# Patient Record
Sex: Male | Born: 1935
Health system: Southern US, Community
[De-identification: ages and names within clinical notes are randomized; demographics above are authoritative.]

## PROBLEM LIST (undated history)

## (undated) DIAGNOSIS — G459 Transient cerebral ischemic attack, unspecified: Secondary | ICD-10-CM

## (undated) DIAGNOSIS — C801 Malignant (primary) neoplasm, unspecified: Secondary | ICD-10-CM

## (undated) DIAGNOSIS — I10 Essential (primary) hypertension: Secondary | ICD-10-CM

## (undated) DIAGNOSIS — K921 Melena: Secondary | ICD-10-CM

## (undated) DIAGNOSIS — K2270 Barrett's esophagus without dysplasia: Secondary | ICD-10-CM

## (undated) DIAGNOSIS — N200 Calculus of kidney: Secondary | ICD-10-CM

## (undated) DIAGNOSIS — K219 Gastro-esophageal reflux disease without esophagitis: Secondary | ICD-10-CM

## (undated) DIAGNOSIS — E785 Hyperlipidemia, unspecified: Secondary | ICD-10-CM

## (undated) HISTORY — DX: Melena: K92.1

## (undated) HISTORY — DX: Gastro-esophageal reflux disease without esophagitis: K21.9

## (undated) HISTORY — DX: Barrett's esophagus without dysplasia: K22.70

## (undated) HISTORY — DX: Calculus of kidney: N20.0

## (undated) HISTORY — PX: PROSTATE BIOPSY: SHX241

## (undated) HISTORY — DX: Essential (primary) hypertension: I10

## (undated) HISTORY — PX: SHOULDER SURGERY: SHX246

## (undated) HISTORY — DX: Hyperlipidemia, unspecified: E78.5

## (undated) HISTORY — DX: Malignant (primary) neoplasm, unspecified: C80.1

## (undated) HISTORY — PX: EYE SURGERY: SHX253

## (undated) HISTORY — DX: Transient cerebral ischemic attack, unspecified: G45.9

---

## 2007-09-07 ENCOUNTER — Ambulatory Visit: Payer: Self-pay | Admitting: Gastroenterology

## 2007-11-03 ENCOUNTER — Ambulatory Visit: Payer: Self-pay | Admitting: Gastroenterology

## 2008-04-10 ENCOUNTER — Ambulatory Visit: Payer: Self-pay | Admitting: Gastroenterology

## 2009-09-16 ENCOUNTER — Ambulatory Visit: Payer: Self-pay | Admitting: Gastroenterology

## 2010-09-26 ENCOUNTER — Emergency Department (HOSPITAL_COMMUNITY)
Admission: EM | Admit: 2010-09-26 | Discharge: 2010-09-26 | Payer: Self-pay | Source: Home / Self Care | Admitting: Emergency Medicine

## 2010-12-15 LAB — POCT I-STAT, CHEM 8
BUN: 11 mg/dL (ref 6–23)
Creatinine, Ser: 1.6 mg/dL — ABNORMAL HIGH (ref 0.4–1.5)
Hemoglobin: 13.9 g/dL (ref 13.0–17.0)
Potassium: 3.4 mEq/L — ABNORMAL LOW (ref 3.5–5.1)
TCO2: 21 mmol/L (ref 0–100)

## 2011-09-24 ENCOUNTER — Encounter: Payer: Self-pay | Admitting: Family Medicine

## 2011-09-24 ENCOUNTER — Ambulatory Visit (INDEPENDENT_AMBULATORY_CARE_PROVIDER_SITE_OTHER): Payer: Medicare Other | Admitting: Family Medicine

## 2011-09-24 DIAGNOSIS — E785 Hyperlipidemia, unspecified: Secondary | ICD-10-CM

## 2011-09-24 DIAGNOSIS — K219 Gastro-esophageal reflux disease without esophagitis: Secondary | ICD-10-CM

## 2011-09-24 DIAGNOSIS — I10 Essential (primary) hypertension: Secondary | ICD-10-CM

## 2011-09-24 DIAGNOSIS — Z8619 Personal history of other infectious and parasitic diseases: Secondary | ICD-10-CM

## 2011-09-24 MED ORDER — LISINOPRIL-HYDROCHLOROTHIAZIDE 20-12.5 MG PO TABS: 0.5000 | ORAL_TABLET | Freq: Every day | ORAL | Status: DC

## 2011-09-24 MED ORDER — CHOLINE FENOFIBRATE 135 MG PO CPDR: 135.0000 mg | DELAYED_RELEASE_CAPSULE | Freq: Every day | ORAL | Status: DC

## 2011-09-24 MED ORDER — ESOMEPRAZOLE MAGNESIUM 40 MG PO CPDR: 40.0000 mg | DELAYED_RELEASE_CAPSULE | Freq: Every day | ORAL | Status: DC

## 2011-09-24 MED ORDER — METOPROLOL TARTRATE 25 MG PO TABS: 25.0000 mg | ORAL_TABLET | Freq: Two times a day (BID) | ORAL | Status: DC

## 2011-09-24 NOTE — Progress Notes (Signed)
New patient.   Hypertension:    Using medication without problems or lightheadedness: yes Chest pain with exertion:no Edema:no Short of breath: no  Elevated Cholesterol: Using medications without problems:yes Muscle aches: no Diet compliance:yes Exercise:yes  Uncertain varicella status.  We talked about this.  See notes on labs.    GERD.  Some heatburn, controlled with nexium.  S/p endoscopy.  He occ gets a gassy feeling, better after belching.    Meds, vitals, and allergies reviewed.   PMH and SH reviewed  ROS: See HPI.  Otherwise negative.    GEN: nad, alert and oriented HEENT: mucous membranes moist NECK: supple w/o LA CV: rrr. PULM: ctab, no inc wob ABD: soft, +bs EXT: no edema SKIN: no acute rash

## 2011-09-24 NOTE — Patient Instructions (Signed)
Take care and schedule a physical in summertime 2013.  Glad to see you.

## 2011-09-27 ENCOUNTER — Encounter: Payer: Self-pay | Admitting: Family Medicine

## 2011-09-27 DIAGNOSIS — Z8619 Personal history of other infectious and parasitic diseases: Secondary | ICD-10-CM | POA: Insufficient documentation

## 2011-09-27 DIAGNOSIS — I10 Essential (primary) hypertension: Secondary | ICD-10-CM | POA: Insufficient documentation

## 2011-09-27 DIAGNOSIS — E785 Hyperlipidemia, unspecified: Secondary | ICD-10-CM | POA: Insufficient documentation

## 2011-09-27 DIAGNOSIS — K219 Gastro-esophageal reflux disease without esophagitis: Secondary | ICD-10-CM | POA: Insufficient documentation

## 2011-09-27 NOTE — Assessment & Plan Note (Signed)
Controlled with benign exam, requesting records.

## 2011-09-27 NOTE — Assessment & Plan Note (Addendum)
Requesting records, no change in meds. >45 min spent with face to face with patient, >50% counseling and/or coordinating care

## 2011-09-27 NOTE — Assessment & Plan Note (Signed)
Requesting records, no change in meds.  D/w pt about diet and weight.

## 2011-10-12 ENCOUNTER — Encounter: Payer: Self-pay | Admitting: Family Medicine

## 2011-10-12 DIAGNOSIS — K2270 Barrett's esophagus without dysplasia: Secondary | ICD-10-CM | POA: Insufficient documentation

## 2011-10-12 DIAGNOSIS — N183 Chronic kidney disease, stage 3 (moderate): Secondary | ICD-10-CM

## 2011-12-10 ENCOUNTER — Ambulatory Visit (INDEPENDENT_AMBULATORY_CARE_PROVIDER_SITE_OTHER): Payer: Medicare Other | Admitting: Family Medicine

## 2011-12-10 ENCOUNTER — Encounter: Payer: Self-pay | Admitting: Family Medicine

## 2011-12-10 VITALS — BP 142/88 | HR 68 | Temp 97.8°F | Wt 184.0 lb

## 2011-12-10 DIAGNOSIS — R05 Cough: Secondary | ICD-10-CM

## 2011-12-10 MED ORDER — HYDROCODONE-HOMATROPINE 5-1.5 MG/5ML PO SYRP: 5.0000 mL | ORAL_SOLUTION | Freq: Three times a day (TID) | ORAL | Status: AC | PRN

## 2011-12-10 MED ORDER — AZITHROMYCIN 250 MG PO TABS: ORAL_TABLET | ORAL | Status: AC

## 2011-12-10 NOTE — Patient Instructions (Signed)
Start the antibiotics today and use the cough medicine as need.  It can make you drowsy.  Drink plenty of fluids, take tylenol as needed, and gargle with warm salt water for your throat.  This should gradually improve.  Take care.  Let us know if you have other concerns.

## 2011-12-10 NOTE — Progress Notes (Signed)
duration of symptoms: 10 days Rhinorrhea: yes with nocturnal post nasal gtt Congestion: no ear pain:no sore throat: yes Cough:yes with sputum, discolored Myalgias: yes other concerns: no fevers but did have some chills Appetite decreased.  No vomiting.    ROS: See HPI.  Otherwise negative.    Meds, vitals, and allergies reviewed.   GEN: nad, alert and oriented HEENT: mucous membranes moist, TM w/o erythema, nasal epithelium injected, OP with cobblestoning NECK: supple w/o LA CV: rrr. PULM: ctab except for coarse BS in LLL, no inc wob ABD: soft, +bs EXT: no edema

## 2011-12-11 DIAGNOSIS — R05 Cough: Secondary | ICD-10-CM | POA: Insufficient documentation

## 2011-12-11 DIAGNOSIS — R059 Cough, unspecified: Secondary | ICD-10-CM | POA: Insufficient documentation

## 2011-12-11 NOTE — Assessment & Plan Note (Signed)
Given the duration and the coarse bs, w/o start azithro and then f/u prn.  Nontoxic.  Okay for outpatient/ fu. D/w pt.

## 2012-03-08 ENCOUNTER — Other Ambulatory Visit (INDEPENDENT_AMBULATORY_CARE_PROVIDER_SITE_OTHER): Payer: Medicare Other

## 2012-03-08 DIAGNOSIS — I10 Essential (primary) hypertension: Secondary | ICD-10-CM | POA: Diagnosis not present

## 2012-03-08 DIAGNOSIS — Z8619 Personal history of other infectious and parasitic diseases: Secondary | ICD-10-CM

## 2012-03-08 LAB — LIPID PANEL
Cholesterol: 191 mg/dL (ref 0–200)
HDL: 49.6 mg/dL (ref >39.00)
Total CHOL/HDL Ratio: 4
Triglycerides: 155 mg/dL — ABNORMAL HIGH (ref 0.0–149.0)

## 2012-03-08 LAB — COMPREHENSIVE METABOLIC PANEL
AST: 26 U/L (ref 0–37)
Albumin: 3.8 g/dL (ref 3.5–5.2)
Alkaline Phosphatase: 58 U/L (ref 39–117)
BUN: 19 mg/dL (ref 6–23)
CO2: 29 mEq/L (ref 19–32)
Chloride: 110 mEq/L (ref 96–112)
Creatinine, Ser: 1.8 mg/dL — ABNORMAL HIGH (ref 0.4–1.5)
Glucose, Bld: 99 mg/dL (ref 70–99)

## 2012-03-09 LAB — VARICELLA ZOSTER ANTIBODY, IGG: Varicella IgG: 4.12 {ISR} — ABNORMAL HIGH

## 2012-03-15 ENCOUNTER — Ambulatory Visit (INDEPENDENT_AMBULATORY_CARE_PROVIDER_SITE_OTHER): Payer: Medicare Other | Admitting: Family Medicine

## 2012-03-15 ENCOUNTER — Encounter: Payer: Self-pay | Admitting: Family Medicine

## 2012-03-15 VITALS — BP 142/84 | HR 43 | Temp 98.4°F | Ht 66.5 in | Wt 185.0 lb

## 2012-03-15 DIAGNOSIS — M720 Palmar fascial fibromatosis [Dupuytren]: Secondary | ICD-10-CM | POA: Diagnosis not present

## 2012-03-15 DIAGNOSIS — Z23 Encounter for immunization: Secondary | ICD-10-CM

## 2012-03-15 DIAGNOSIS — N189 Chronic kidney disease, unspecified: Secondary | ICD-10-CM

## 2012-03-15 DIAGNOSIS — I10 Essential (primary) hypertension: Secondary | ICD-10-CM | POA: Diagnosis not present

## 2012-03-15 DIAGNOSIS — E785 Hyperlipidemia, unspecified: Secondary | ICD-10-CM

## 2012-03-15 DIAGNOSIS — K2270 Barrett's esophagus without dysplasia: Secondary | ICD-10-CM

## 2012-03-15 DIAGNOSIS — Z0000 Encounter for general adult medical examination without abnormal findings: Secondary | ICD-10-CM | POA: Diagnosis not present

## 2012-03-15 MED ORDER — TETANUS-DIPHTHERIA TOXOIDS TD 2-2 LF/0.5ML IM SUSP: 0.5000 mL | Freq: Once | INTRAMUSCULAR | Status: DC

## 2012-03-15 MED ORDER — SILDENAFIL CITRATE 50 MG PO TABS: 25.0000 mg | ORAL_TABLET | Freq: Every day | ORAL | Status: DC | PRN

## 2012-03-15 NOTE — Progress Notes (Signed)
I have personally reviewed the Medicare Annual Wellness questionnaire and have noted 1. The patient's medical and social history 2. Their use of alcohol, tobacco or illicit drugs 3. Their current medications and supplements 4. The patient's functional ability including ADL's, fall risks, home safety risks and hearing or visual             impairment. 5. Diet and physical activities 6. Evidence for depression or mood disorders  The patients weight, height, BMI have been recorded in the chart and visual acuity is per eye clinic.  I have made referrals, counseling and provided education to the patient based review of the above and I have provided the pt with a written personalized care plan for preventive services.  See plan and scanned forms.  Routine anticipatory guidance given to patient.  See health maintenance. He has a living will.   Td 2013 PNA done after age 73 per patient report Shingles shot encouraged Flu shot done 2012 PSA d/w pt.  He'll consider checking in future.    Hand pain.  His fingers are tight and he'll get locking at mult PIP joints.  He'll have to manually extend the fingers.  No recent trauma.  Gradually getting worse.   ED.  Asking for viagra.  Hasn't used prev.  No CP.    Elevated Cholesterol: Using medications without problems:yes Muscle aches: no Diet compliance: 'not very good', discussed.   Exercise:yes  Hypertension:    Using medication without problems or lightheadedness: yes Chest pain with exertion:no Edema:no Short of breath:no  CKD.  Baseline Cr 1.5-1.8.  Stable based on current labs.  Not on nsaids.  Labs d/w pt.   PMH and SH reviewed  Meds, vitals, and allergies reviewed.   ROS: See HPI.  Otherwise negative.    GEN: nad, alert and oriented HEENT: mucous membranes moist NECK: supple w/o LA CV: rrr. PULM: ctab, no inc wob ABD: soft, +bs EXT: no edema SKIN: no acute rash DRE deferred.  Flexor contractions noted with degenerative  changes at mult PIPs on the hands.

## 2012-03-15 NOTE — Assessment & Plan Note (Signed)
Continue ACE and avoid nsaids.  Wouldn't not inc meds at this point.  Will follow.

## 2012-03-15 NOTE — Assessment & Plan Note (Signed)
At baseline, would recheck in 6 months.  D/w pt.  If worsening, would refer to renal .

## 2012-03-15 NOTE — Patient Instructions (Addendum)
Check with your insurance to see if they will cover the shingles shot. I would get a flu shot each fall.   Get me a copy of your last upper endoscopy and colonoscopy so I can review them.  Think about checking a PSA.  It may not be useful to do at this point, since you have had negative biopsies previously.   Come back for a repeat blood draw in 6 month and then come see me for a visit after that.  See Rosaria Ferries about your referral before you leave today.

## 2012-03-15 NOTE — Assessment & Plan Note (Signed)
Continue PPI.  Tolerated well.

## 2012-03-15 NOTE — Assessment & Plan Note (Signed)
See plan and scanned forms. Routine anticipatory guidance given to patient. See health maintenance.  He has a living will.  Td 2013  PNA done after age 77 per patient report  Shingles shot encouraged  Flu shot done 2012  PSA d/w pt. He'll consider checking in future.

## 2012-03-15 NOTE — Assessment & Plan Note (Signed)
Acceptable and statin intolerant per patient.

## 2012-03-18 DIAGNOSIS — M19049 Primary osteoarthritis, unspecified hand: Secondary | ICD-10-CM | POA: Diagnosis not present

## 2012-03-18 DIAGNOSIS — M6530 Trigger finger, unspecified finger: Secondary | ICD-10-CM | POA: Diagnosis not present

## 2012-03-22 ENCOUNTER — Encounter: Payer: Self-pay | Admitting: Family Medicine

## 2012-03-22 DIAGNOSIS — Z789 Other specified health status: Secondary | ICD-10-CM | POA: Insufficient documentation

## 2012-04-01 DIAGNOSIS — M6530 Trigger finger, unspecified finger: Secondary | ICD-10-CM | POA: Diagnosis not present

## 2012-04-06 ENCOUNTER — Telehealth: Payer: Self-pay

## 2012-04-06 NOTE — Telephone Encounter (Signed)
Pt needs shingles rx sent to [place] will ck with pharmacy later in week.

## 2012-04-07 MED ORDER — ZOSTER VACCINE LIVE 19400 UNT/0.65ML ~~LOC~~ SOLR: 0.6500 mL | Freq: Once | SUBCUTANEOUS | Status: AC

## 2012-04-07 NOTE — Telephone Encounter (Signed)
Sent!

## 2012-04-14 DIAGNOSIS — M6530 Trigger finger, unspecified finger: Secondary | ICD-10-CM | POA: Diagnosis not present

## 2012-04-20 DIAGNOSIS — H2510 Age-related nuclear cataract, unspecified eye: Secondary | ICD-10-CM | POA: Diagnosis not present

## 2012-05-16 DIAGNOSIS — M19049 Primary osteoarthritis, unspecified hand: Secondary | ICD-10-CM | POA: Diagnosis not present

## 2012-05-16 DIAGNOSIS — M6530 Trigger finger, unspecified finger: Secondary | ICD-10-CM | POA: Diagnosis not present

## 2012-06-16 DIAGNOSIS — M6530 Trigger finger, unspecified finger: Secondary | ICD-10-CM | POA: Diagnosis not present

## 2012-06-21 ENCOUNTER — Encounter: Payer: Self-pay | Admitting: Family Medicine

## 2012-06-21 DIAGNOSIS — M6530 Trigger finger, unspecified finger: Secondary | ICD-10-CM | POA: Insufficient documentation

## 2012-08-08 DIAGNOSIS — M6530 Trigger finger, unspecified finger: Secondary | ICD-10-CM | POA: Diagnosis not present

## 2012-08-29 ENCOUNTER — Telehealth: Payer: Self-pay | Admitting: *Deleted

## 2012-08-29 MED ORDER — FENOFIBRATE 145 MG PO TABS: 145.0000 mg | ORAL_TABLET | Freq: Every day | ORAL | Status: DC

## 2012-08-29 NOTE — Telephone Encounter (Signed)
Form filled out, change to fenofibrate.  Med list updated.

## 2012-08-29 NOTE — Telephone Encounter (Signed)
Received form from pharmacy stating that Trilipix will no longer be covered on patient's drug list effective 10/05/12. Alternative drugs listed on form. Form is in your in box.

## 2012-08-29 NOTE — Telephone Encounter (Signed)
Form faxed

## 2012-08-30 DIAGNOSIS — H2510 Age-related nuclear cataract, unspecified eye: Secondary | ICD-10-CM | POA: Diagnosis not present

## 2012-09-19 DIAGNOSIS — M6530 Trigger finger, unspecified finger: Secondary | ICD-10-CM | POA: Diagnosis not present

## 2012-09-19 DIAGNOSIS — M19049 Primary osteoarthritis, unspecified hand: Secondary | ICD-10-CM | POA: Diagnosis not present

## 2012-09-20 ENCOUNTER — Ambulatory Visit: Payer: Self-pay | Admitting: Ophthalmology

## 2012-09-20 DIAGNOSIS — H2510 Age-related nuclear cataract, unspecified eye: Secondary | ICD-10-CM | POA: Diagnosis not present

## 2012-09-20 DIAGNOSIS — Z01812 Encounter for preprocedural laboratory examination: Secondary | ICD-10-CM | POA: Diagnosis not present

## 2012-09-20 DIAGNOSIS — I10 Essential (primary) hypertension: Secondary | ICD-10-CM | POA: Diagnosis not present

## 2012-09-20 DIAGNOSIS — Z01810 Encounter for preprocedural cardiovascular examination: Secondary | ICD-10-CM | POA: Diagnosis not present

## 2012-09-20 LAB — POTASSIUM: Potassium: 4.6 mmol/L (ref 3.5–5.1)

## 2012-09-22 ENCOUNTER — Telehealth: Payer: Self-pay

## 2012-09-22 NOTE — Telephone Encounter (Signed)
Pt said fenofibrate cost $300 and that is too expensive. Spoke with Legrand Como at [facility] [employer]; he said pt would need to contact insurance co for alternative substitutes. Pt will call ins. Co and call back with more affordable substitute for fenofibrate.

## 2012-10-10 ENCOUNTER — Ambulatory Visit: Payer: Self-pay | Admitting: Ophthalmology

## 2012-10-10 DIAGNOSIS — H269 Unspecified cataract: Secondary | ICD-10-CM | POA: Diagnosis not present

## 2012-10-10 DIAGNOSIS — H2510 Age-related nuclear cataract, unspecified eye: Secondary | ICD-10-CM | POA: Diagnosis not present

## 2012-10-10 DIAGNOSIS — Z79899 Other long term (current) drug therapy: Secondary | ICD-10-CM | POA: Diagnosis not present

## 2012-10-10 DIAGNOSIS — Z85828 Personal history of other malignant neoplasm of skin: Secondary | ICD-10-CM | POA: Diagnosis not present

## 2012-10-10 DIAGNOSIS — Z87891 Personal history of nicotine dependence: Secondary | ICD-10-CM | POA: Diagnosis not present

## 2012-10-10 DIAGNOSIS — E7800 Pure hypercholesterolemia, unspecified: Secondary | ICD-10-CM | POA: Diagnosis not present

## 2012-10-10 DIAGNOSIS — I10 Essential (primary) hypertension: Secondary | ICD-10-CM | POA: Diagnosis not present

## 2012-10-10 DIAGNOSIS — Z7982 Long term (current) use of aspirin: Secondary | ICD-10-CM | POA: Diagnosis not present

## 2012-10-10 DIAGNOSIS — K219 Gastro-esophageal reflux disease without esophagitis: Secondary | ICD-10-CM | POA: Diagnosis not present

## 2012-10-10 DIAGNOSIS — K2270 Barrett's esophagus without dysplasia: Secondary | ICD-10-CM | POA: Diagnosis not present

## 2012-12-22 DIAGNOSIS — D485 Neoplasm of uncertain behavior of skin: Secondary | ICD-10-CM | POA: Diagnosis not present

## 2012-12-22 DIAGNOSIS — L821 Other seborrheic keratosis: Secondary | ICD-10-CM | POA: Diagnosis not present

## 2013-01-09 DIAGNOSIS — D126 Benign neoplasm of colon, unspecified: Secondary | ICD-10-CM | POA: Diagnosis not present

## 2013-01-09 DIAGNOSIS — K2270 Barrett's esophagus without dysplasia: Secondary | ICD-10-CM | POA: Diagnosis not present

## 2013-01-11 DIAGNOSIS — K2270 Barrett's esophagus without dysplasia: Secondary | ICD-10-CM | POA: Diagnosis not present

## 2013-02-04 ENCOUNTER — Other Ambulatory Visit: Payer: Self-pay | Admitting: Family Medicine

## 2013-03-20 ENCOUNTER — Other Ambulatory Visit: Payer: Self-pay | Admitting: Family Medicine

## 2013-05-30 ENCOUNTER — Other Ambulatory Visit: Payer: Self-pay | Admitting: Family Medicine

## 2013-05-30 NOTE — Telephone Encounter (Signed)
Electronic refill request. Pt hasn't been seen in > 1 year, no upcoming appts scheduled.

## 2013-05-31 NOTE — Telephone Encounter (Signed)
Sent, schedule [facility] visit.  Thanks.

## 2013-06-01 NOTE — Telephone Encounter (Signed)
Patient advised.   Will call for CPE appt.

## 2013-06-13 DIAGNOSIS — H2510 Age-related nuclear cataract, unspecified eye: Secondary | ICD-10-CM | POA: Diagnosis not present

## 2013-06-13 DIAGNOSIS — Z961 Presence of intraocular lens: Secondary | ICD-10-CM | POA: Diagnosis not present

## 2013-07-03 ENCOUNTER — Other Ambulatory Visit: Payer: Self-pay | Admitting: Family Medicine

## 2013-07-03 NOTE — Telephone Encounter (Signed)
Electronic refill request. Patient has not been seen in > 1 year.  Please advise.

## 2013-07-03 NOTE — Telephone Encounter (Signed)
Sent, scheduled for later in 2014.

## 2013-07-27 ENCOUNTER — Other Ambulatory Visit: Payer: Self-pay | Admitting: Family Medicine

## 2013-07-27 DIAGNOSIS — I10 Essential (primary) hypertension: Secondary | ICD-10-CM

## 2013-08-01 ENCOUNTER — Other Ambulatory Visit (INDEPENDENT_AMBULATORY_CARE_PROVIDER_SITE_OTHER): Payer: Medicare Other

## 2013-08-01 DIAGNOSIS — I10 Essential (primary) hypertension: Secondary | ICD-10-CM

## 2013-08-01 LAB — LIPID PANEL
LDL Cholesterol: 113 mg/dL — ABNORMAL HIGH (ref 0–99)
Total CHOL/HDL Ratio: 4
VLDL: 23 mg/dL (ref 0.0–40.0)

## 2013-08-01 LAB — COMPREHENSIVE METABOLIC PANEL
ALT: 16 U/L (ref 0–53)
AST: 21 U/L (ref 0–37)
Albumin: 3.9 g/dL (ref 3.5–5.2)
Alkaline Phosphatase: 54 U/L (ref 39–117)
CO2: 29 mEq/L (ref 19–32)
Potassium: 4.4 mEq/L (ref 3.5–5.1)
Sodium: 140 mEq/L (ref 135–145)
Total Bilirubin: 0.9 mg/dL (ref 0.3–1.2)
Total Protein: 7.8 g/dL (ref 6.0–8.3)

## 2013-08-08 ENCOUNTER — Encounter: Payer: Self-pay | Admitting: Family Medicine

## 2013-08-08 ENCOUNTER — Ambulatory Visit (INDEPENDENT_AMBULATORY_CARE_PROVIDER_SITE_OTHER): Payer: Medicare Other | Admitting: Family Medicine

## 2013-08-08 VITALS — BP 124/66 | HR 60 | Temp 98.0°F | Ht 66.5 in | Wt 186.5 lb

## 2013-08-08 DIAGNOSIS — R7309 Other abnormal glucose: Secondary | ICD-10-CM

## 2013-08-08 DIAGNOSIS — Z23 Encounter for immunization: Secondary | ICD-10-CM

## 2013-08-08 DIAGNOSIS — I10 Essential (primary) hypertension: Secondary | ICD-10-CM

## 2013-08-08 DIAGNOSIS — K2270 Barrett's esophagus without dysplasia: Secondary | ICD-10-CM

## 2013-08-08 DIAGNOSIS — E785 Hyperlipidemia, unspecified: Secondary | ICD-10-CM

## 2013-08-08 DIAGNOSIS — H9190 Unspecified hearing loss, unspecified ear: Secondary | ICD-10-CM | POA: Diagnosis not present

## 2013-08-08 DIAGNOSIS — Z0000 Encounter for general adult medical examination without abnormal findings: Secondary | ICD-10-CM

## 2013-08-08 DIAGNOSIS — M76899 Other specified enthesopathies of unspecified lower limb, excluding foot: Secondary | ICD-10-CM

## 2013-08-08 DIAGNOSIS — M7062 Trochanteric bursitis, left hip: Secondary | ICD-10-CM

## 2013-08-08 DIAGNOSIS — R739 Hyperglycemia, unspecified: Secondary | ICD-10-CM

## 2013-08-08 LAB — HEMOGLOBIN A1C: Hgb A1c MFr Bld: 5.9 % (ref 4.6–6.5)

## 2013-08-08 MED ORDER — ESOMEPRAZOLE MAGNESIUM 40 MG PO CPDR: DELAYED_RELEASE_CAPSULE | ORAL | Status: DC

## 2013-08-08 MED ORDER — METOPROLOL TARTRATE 25 MG PO TABS: 25.0000 mg | ORAL_TABLET | Freq: Every day | ORAL | Status: DC

## 2013-08-08 MED ORDER — LISINOPRIL-HYDROCHLOROTHIAZIDE 20-12.5 MG PO TABS: ORAL_TABLET | ORAL | Status: DC

## 2013-08-08 MED ORDER — FENOFIBRATE 145 MG PO TABS: 145.0000 mg | ORAL_TABLET | Freq: Every day | ORAL | Status: DC

## 2013-08-08 NOTE — Patient Instructions (Signed)
Try taking 2 extra strength tylenol (500mg /tab) at night and use an ice bag on the painful area.  That should help.  Avoid ibuprofen/aleve/advil/Goody's/BCs.   Go to the lab on the way out.  We'll contact you with your lab report.  Rosaria Ferries will call about your referral. Avoid sugary foods as we discussed.   Take care.  Don't change your meds for now.

## 2013-08-09 ENCOUNTER — Encounter: Payer: Self-pay | Admitting: Family Medicine

## 2013-08-09 DIAGNOSIS — R739 Hyperglycemia, unspecified: Secondary | ICD-10-CM | POA: Insufficient documentation

## 2013-08-09 DIAGNOSIS — M7060 Trochanteric bursitis, unspecified hip: Secondary | ICD-10-CM | POA: Insufficient documentation

## 2013-08-09 NOTE — Assessment & Plan Note (Signed)
Stable d/w pt.  Will follow, continue ACE.  Avoid nsaids.

## 2013-08-09 NOTE — Assessment & Plan Note (Signed)
Controlled, continue as is.  Labs d/w pt.  

## 2013-08-09 NOTE — Assessment & Plan Note (Signed)
See scanned forms.  Routine anticipatory guidance given to patient.  See health maintenance. Flu 2014 Shingles 2013 PNA prev done Tetanus 2013 Colonoscopy not indicated given age.  Pt agrees.  Prostate cancer screening not indicated given age, prev neg biopsy x2.  Pt agrees.  Prostate cancer screening and PSA options (with potential risks and benefits of testing vs not testing) were discussed along with recent recs/guidelines.  He declined testing PSA at this point. Advance directive d/w pt, on file.  Cognitive function addressed- see scanned forms- and if abnormal then additional documentation follows.

## 2013-08-09 NOTE — Assessment & Plan Note (Signed)
Likely, d/w pt about anatomy, avoiding nsaids, using tylenol and ice and fu prn.  He agrees.

## 2013-08-09 NOTE — Assessment & Plan Note (Signed)
Hyperglycemia, noted on labs.  D/w pt re: low carb diet.  See f/u A1c.

## 2013-08-09 NOTE — Progress Notes (Signed)
I have personally reviewed the Medicare Annual Wellness questionnaire and have noted 1. The patient's medical and social history 2. Their use of alcohol, tobacco or illicit drugs 3. Their current medications and supplements 4. The patient's functional ability including ADL's, fall risks, home safety risks and hearing or visual             impairment. 5. Diet and physical activities 6. Evidence for depression or mood disorders  The patients weight, height, BMI have been recorded in the chart and visual acuity is per eye clinic.  I have made referrals, counseling and provided education to the patient based review of the above and I have provided the pt with a written personalized care plan for preventive services.  See scanned forms.  Routine anticipatory guidance given to patient.  See health maintenance. Flu 2014 Shingles 2013 PNA prev done Tetanus 2013 Colonoscopy not indicated given age.  Pt agrees.  Prostate cancer screening not indicated given age, prev neg biopsy x2.  Pt agrees.  Prostate cancer screening and PSA options (with potential risks and benefits of testing vs not testing) were discussed along with recent recs/guidelines.  He declined testing PSA at this point. Advance directive d/w pt, on file.  Cognitive function addressed- see scanned forms- and if abnormal then additional documentation follows.    Hypertension:    Using medication without problems or lightheadedness: y Chest pain with exertion:n Edema:n Short of breath:n  Elevated Cholesterol: Using medications without problems:y Muscle aches: n Diet compliance:y Exercise:y  H/o Barrett's on PPI and doing well w/o GERD sx.  No obstructive sx.  He declined further EGD given his age and lack of sx.    L lateral hip pain, near the greater troch, only pain at night when laying on that side for prolonged period.  Ibuprofen helps, discussed given his CKD.   Hyperglycemia, noted on labs.  D/w pt re: low carb diet.   See f/u A1c.  PMH and SH reviewed  Meds, vitals, and allergies reviewed.   ROS: See HPI.  Otherwise negative.    GEN: nad, alert and oriented HEENT: mucous membranes moist NECK: supple w/o LA CV: rrr. PULM: ctab, no inc wob ABD: soft, +bs EXT: no edema SKIN: no acute rash L hip with normal ROM and not ttp- the area that is sore at night is at the greater troch

## 2013-08-09 NOTE — Assessment & Plan Note (Signed)
Sx controlled with PPI, continue as is, he declined further EGD.

## 2013-08-10 ENCOUNTER — Encounter: Payer: Self-pay | Admitting: Family Medicine

## 2013-09-18 ENCOUNTER — Telehealth: Payer: Self-pay

## 2013-09-18 NOTE — Telephone Encounter (Signed)
Pt does not understand why he is getting denials from ins for 08/08/13 wellness exam, cost $559.00. [name] said medicare pd except for $20.66. Pt was relieved and appreciative.

## 2013-10-04 ENCOUNTER — Encounter: Payer: Self-pay | Admitting: Family Medicine

## 2013-10-16 ENCOUNTER — Ambulatory Visit: Payer: Self-pay | Admitting: Podiatry

## 2013-10-23 ENCOUNTER — Ambulatory Visit (INDEPENDENT_AMBULATORY_CARE_PROVIDER_SITE_OTHER): Payer: Medicare Other | Admitting: Podiatry

## 2013-10-23 ENCOUNTER — Encounter: Payer: Self-pay | Admitting: Podiatry

## 2013-10-23 VITALS — BP 131/76 | HR 57 | Resp 16 | Ht 67.0 in | Wt 182.0 lb

## 2013-10-23 DIAGNOSIS — L600 Ingrowing nail: Secondary | ICD-10-CM

## 2013-10-23 MED ORDER — NEOMYCIN-POLYMYXIN-HC 3.5-10000-1 OT SOLN: OTIC | Status: DC

## 2013-10-23 NOTE — Progress Notes (Signed)
   Subjective:    Patient ID: Justin Ford, male    DOB: 12/23/1929, 80 y.o.   MRN: [id number]  HPI Comments: i have an ingrown on my left big toe. Ive had it for 6 - 8 months and i have been trying to work with it with total failure. It does get better when i go into it and when it grows out it starts hurting again.      Review of Systems  All other systems reviewed and are negative.       Objective:   Physical Exam: I have reviewed his past medical history medications allergies surgeries social history and review of systems. Pulses are strongly palpable bilateral foot. Neurologic sensorium is intact per since once the monofilament. Deep tendon reflexes are intact bilateral muscle strength is 5 over 5 dorsiflexors plantar flexors inverters everters all intrinsic musculature must be intact. Orthopedic evaluation Mr. is all joints distal to the ankle a full range of motion without crepitus. Mild flexible digital deformities are noted bilateral. Sharp incurvated nail margin along the tibial border of the hallux left. This clinical signs of infection at this point time to        Assessment & Plan:  Assessment: Ingrown toenail paronychia hallux right.  Plan: Discussed etiology pathology conservative versus surgical therapies. At this point after local anesthetic a chemical matrixectomy was performed to the tibial border of the hallux left. He was given both oral and written home-going instructions for the care of this as well as soaking therapy. I will wrote her prescription for Cortisporin Otic which upon a twice a day basis after soaking. I will followup with him in one week.

## 2013-10-23 NOTE — Patient Instructions (Signed)

## 2013-10-30 ENCOUNTER — Ambulatory Visit (INDEPENDENT_AMBULATORY_CARE_PROVIDER_SITE_OTHER): Payer: Medicare Other | Admitting: Podiatry

## 2013-10-30 ENCOUNTER — Encounter: Payer: Self-pay | Admitting: Podiatry

## 2013-10-30 VITALS — BP 149/92 | HR 56 | Resp 16

## 2013-10-30 DIAGNOSIS — Z9889 Other specified postprocedural states: Secondary | ICD-10-CM

## 2013-10-30 NOTE — Progress Notes (Signed)
Nail check left great , little red I had been soaking in Epsom salts and water better recently started in Betadine. He denies fever chills nausea vomiting muscle aches or pains. States that the toe has a little bit tender.  Objective: Vital signs are stable he is alert and oriented x3. There is no erythema cellulitis drainage or odor the surgical nail site left. Some mild ecchymosis is noted.  Assessment: Well-healing surgical toe matrixectomy tibial border hallux left.  Plan: Discontinue Betadine start with Epsom salts warm water soaks continue to soak daily to completely heal. Cover during the day and leave open at night continued Cortisporin Otic as directed. Remembered it thank him for the articles  [facility].

## 2013-12-19 DIAGNOSIS — D235 Other benign neoplasm of skin of trunk: Secondary | ICD-10-CM | POA: Diagnosis not present

## 2013-12-19 DIAGNOSIS — Z85828 Personal history of other malignant neoplasm of skin: Secondary | ICD-10-CM | POA: Diagnosis not present

## 2014-04-15 ENCOUNTER — Other Ambulatory Visit: Payer: Self-pay | Admitting: Family Medicine

## 2014-06-08 DIAGNOSIS — H2510 Age-related nuclear cataract, unspecified eye: Secondary | ICD-10-CM | POA: Diagnosis not present

## 2014-06-14 DIAGNOSIS — Z23 Encounter for immunization: Secondary | ICD-10-CM | POA: Diagnosis not present

## 2014-06-29 DIAGNOSIS — L723 Sebaceous cyst: Secondary | ICD-10-CM | POA: Diagnosis not present

## 2014-07-25 DIAGNOSIS — H2512 Age-related nuclear cataract, left eye: Secondary | ICD-10-CM | POA: Diagnosis not present

## 2014-07-31 ENCOUNTER — Telehealth: Payer: Self-pay

## 2014-07-31 NOTE — Telephone Encounter (Signed)
Pt left v/m; pt received notice from [facility] that Nexium will no longer be covered and esomeprazole magnesium ( ? generic Nexium) is covered. Pt just got 90 day refill of Nexium. Pt wanted Dr Damita Dunnings to be aware in 90 days he will need esomeprazole instead of name brand Nexium. Pt last seen 08/08/2013 and no future appt scheduled.

## 2014-08-01 NOTE — Telephone Encounter (Signed)
Esomeprazole is generic nexium.  That is what we have listed on the EMR now, so that should be okay for the refills later one.  Please schedule a CPE when possible.  Thanks.

## 2014-08-01 NOTE — Telephone Encounter (Signed)
Patient notified as instructed by telephone. Patient transferred to scheduler to get a physical scheduled.

## 2014-08-02 ENCOUNTER — Ambulatory Visit: Payer: Self-pay | Admitting: Ophthalmology

## 2014-08-02 DIAGNOSIS — Z01812 Encounter for preprocedural laboratory examination: Secondary | ICD-10-CM | POA: Diagnosis not present

## 2014-08-02 DIAGNOSIS — K219 Gastro-esophageal reflux disease without esophagitis: Secondary | ICD-10-CM | POA: Diagnosis not present

## 2014-08-02 DIAGNOSIS — H2512 Age-related nuclear cataract, left eye: Secondary | ICD-10-CM | POA: Diagnosis not present

## 2014-08-02 DIAGNOSIS — E780 Pure hypercholesterolemia: Secondary | ICD-10-CM | POA: Diagnosis not present

## 2014-08-02 DIAGNOSIS — I10 Essential (primary) hypertension: Secondary | ICD-10-CM | POA: Diagnosis not present

## 2014-08-02 DIAGNOSIS — Z01810 Encounter for preprocedural cardiovascular examination: Secondary | ICD-10-CM | POA: Diagnosis not present

## 2014-08-02 DIAGNOSIS — H269 Unspecified cataract: Secondary | ICD-10-CM | POA: Diagnosis not present

## 2014-08-02 LAB — POTASSIUM: Potassium: 4.2 mmol/L (ref 3.5–5.1)

## 2014-08-06 ENCOUNTER — Ambulatory Visit: Payer: Self-pay | Admitting: Ophthalmology

## 2014-08-06 DIAGNOSIS — H269 Unspecified cataract: Secondary | ICD-10-CM | POA: Diagnosis not present

## 2014-08-06 DIAGNOSIS — Z7982 Long term (current) use of aspirin: Secondary | ICD-10-CM | POA: Diagnosis not present

## 2014-08-06 DIAGNOSIS — H25012 Cortical age-related cataract, left eye: Secondary | ICD-10-CM | POA: Diagnosis not present

## 2014-08-06 DIAGNOSIS — K2270 Barrett's esophagus without dysplasia: Secondary | ICD-10-CM | POA: Diagnosis not present

## 2014-08-06 DIAGNOSIS — Z85828 Personal history of other malignant neoplasm of skin: Secondary | ICD-10-CM | POA: Diagnosis not present

## 2014-08-06 DIAGNOSIS — I10 Essential (primary) hypertension: Secondary | ICD-10-CM | POA: Diagnosis not present

## 2014-08-06 DIAGNOSIS — E780 Pure hypercholesterolemia: Secondary | ICD-10-CM | POA: Diagnosis not present

## 2014-08-06 DIAGNOSIS — K219 Gastro-esophageal reflux disease without esophagitis: Secondary | ICD-10-CM | POA: Diagnosis not present

## 2014-08-06 DIAGNOSIS — H2512 Age-related nuclear cataract, left eye: Secondary | ICD-10-CM | POA: Diagnosis not present

## 2014-08-06 DIAGNOSIS — Z87442 Personal history of urinary calculi: Secondary | ICD-10-CM | POA: Diagnosis not present

## 2014-08-06 DIAGNOSIS — Z79899 Other long term (current) drug therapy: Secondary | ICD-10-CM | POA: Diagnosis not present

## 2014-08-12 ENCOUNTER — Other Ambulatory Visit: Payer: Self-pay | Admitting: Family Medicine

## 2014-08-12 DIAGNOSIS — I10 Essential (primary) hypertension: Secondary | ICD-10-CM

## 2014-08-12 DIAGNOSIS — R739 Hyperglycemia, unspecified: Secondary | ICD-10-CM

## 2014-08-16 ENCOUNTER — Other Ambulatory Visit (INDEPENDENT_AMBULATORY_CARE_PROVIDER_SITE_OTHER): Payer: Medicare Other

## 2014-08-16 DIAGNOSIS — I10 Essential (primary) hypertension: Secondary | ICD-10-CM

## 2014-08-16 DIAGNOSIS — R739 Hyperglycemia, unspecified: Secondary | ICD-10-CM

## 2014-08-16 LAB — LIPID PANEL
Cholesterol: 195 mg/dL (ref 0–200)
HDL: 39.2 mg/dL (ref >39.00)
LDL Cholesterol: 137 mg/dL — ABNORMAL HIGH (ref 0–99)
NONHDL: 155.8
Total CHOL/HDL Ratio: 5
Triglycerides: 95 mg/dL (ref 0.0–149.0)
VLDL: 19 mg/dL (ref 0.0–40.0)

## 2014-08-16 LAB — COMPREHENSIVE METABOLIC PANEL
ALBUMIN: 3.6 g/dL (ref 3.5–5.2)
ALT: 24 U/L (ref 0–53)
AST: 28 U/L (ref 0–37)
Alkaline Phosphatase: 56 U/L (ref 39–117)
BILIRUBIN TOTAL: 0.6 mg/dL (ref 0.2–1.2)
BUN: 18 mg/dL (ref 6–23)
CALCIUM: 10.1 mg/dL (ref 8.4–10.5)
CHLORIDE: 108 meq/L (ref 96–112)
CO2: 28 meq/L (ref 19–32)
Creatinine, Ser: 1.8 mg/dL — ABNORMAL HIGH (ref 0.4–1.5)
GFR: 38.83 mL/min — AB (ref >60.00)
Glucose, Bld: 116 mg/dL — ABNORMAL HIGH (ref 70–99)
POTASSIUM: 4.4 meq/L (ref 3.5–5.1)
SODIUM: 142 meq/L (ref 135–145)
TOTAL PROTEIN: 7.9 g/dL (ref 6.0–8.3)

## 2014-08-16 LAB — HEMOGLOBIN A1C: HEMOGLOBIN A1C: 5.9 % (ref 4.6–6.5)

## 2014-08-23 ENCOUNTER — Encounter: Payer: Self-pay | Admitting: Family Medicine

## 2014-08-23 ENCOUNTER — Ambulatory Visit (INDEPENDENT_AMBULATORY_CARE_PROVIDER_SITE_OTHER): Payer: Medicare Other | Admitting: Family Medicine

## 2014-08-23 VITALS — BP 126/76 | HR 54 | Temp 98.3°F | Ht 67.5 in | Wt 187.5 lb

## 2014-08-23 DIAGNOSIS — Z0000 Encounter for general adult medical examination without abnormal findings: Secondary | ICD-10-CM | POA: Diagnosis not present

## 2014-08-23 DIAGNOSIS — R739 Hyperglycemia, unspecified: Secondary | ICD-10-CM

## 2014-08-23 DIAGNOSIS — K2270 Barrett's esophagus without dysplasia: Secondary | ICD-10-CM

## 2014-08-23 DIAGNOSIS — I10 Essential (primary) hypertension: Secondary | ICD-10-CM | POA: Diagnosis not present

## 2014-08-23 DIAGNOSIS — E785 Hyperlipidemia, unspecified: Secondary | ICD-10-CM

## 2014-08-23 DIAGNOSIS — N183 Chronic kidney disease, stage 3 unspecified: Secondary | ICD-10-CM

## 2014-08-23 DIAGNOSIS — Z23 Encounter for immunization: Secondary | ICD-10-CM

## 2014-08-23 DIAGNOSIS — Z789 Other specified health status: Secondary | ICD-10-CM

## 2014-08-23 MED ORDER — METOPROLOL TARTRATE 25 MG PO TABS: 25.0000 mg | ORAL_TABLET | Freq: Every day | ORAL | Status: DC

## 2014-08-23 MED ORDER — LISINOPRIL-HYDROCHLOROTHIAZIDE 20-12.5 MG PO TABS: ORAL_TABLET | ORAL | Status: DC

## 2014-08-23 MED ORDER — FENOFIBRATE 145 MG PO TABS: 145.0000 mg | ORAL_TABLET | Freq: Every day | ORAL | Status: DC

## 2014-08-23 MED ORDER — ESOMEPRAZOLE MAGNESIUM 40 MG PO CPDR: DELAYED_RELEASE_CAPSULE | ORAL | Status: DC

## 2014-08-23 NOTE — Patient Instructions (Signed)
Take care.  Glad to see you.  Keep working on cutting back on sugars in your diet.  Call if questions.  Happy Thanksgiving.

## 2014-08-23 NOTE — Progress Notes (Signed)
Pre visit review using our clinic review tool, if applicable. No additional management support is needed unless otherwise documented below in the visit note.  See scanned forms. Routine anticipatory guidance given to patient. See health maintenance. Flu 2015 Shingles 2013 PNA prev done, updated today Tetanus 2013 Colonoscopy not indicated given age. Pt agrees.  Prostate cancer screening not indicated given age, prev neg biopsy x2. Pt agrees. Prostate cancer screening and PSA options (with potential risks and benefits of testing vs not testing) were discussed along with recent recs/guidelines. He declined testing PSA at this point. Advance directive d/w pt, on file.  Would have wife or daughter designated if patient were incapacitated.  Cognitive function addressed- see scanned forms- and if abnormal then additional documentation follows.   Hypertension:  Using medication without problems or lightheadedness: y Chest pain with exertion:n Edema:n Short of breath:n  Elevated Cholesterol: Using medications without problems:y Muscle aches: n Diet compliance:encouraged compliance.  Exercise:encouraged as tolerated.   CKD d/w pt, labs reviewed.  Tolerating BP meds.  See plan.   H/o Barrett's on PPI and doing well w/o GERD sx. No obstructive sx. He declined further EGD given his age and lack of sx.   Hyperglycemia, noted on labs. D/w pt re: low carb diet. A1c stable.    PMH and SH reviewed  ROS: See HPI, otherwise noncontributory.  Meds, vitals, and allergies reviewed.   GEN: nad, alert and oriented HEENT: mucous membranes moist NECK: supple w/o LA CV: rrr.  no murmur PULM: ctab, no inc wob ABD: soft, +bs EXT: no edema SKIN: no acute rash

## 2014-08-24 NOTE — Assessment & Plan Note (Signed)
Controlled, continue as is.  He agrees. Labs d/w pt.

## 2014-08-24 NOTE — Assessment & Plan Note (Signed)
Doing well, continue PPI.  No ADE on med. No sx.

## 2014-08-24 NOTE — Assessment & Plan Note (Signed)
No change in meds, he'll work on diet and exercise as tolerated.  Labs d/w pt.

## 2014-08-24 NOTE — Assessment & Plan Note (Signed)
See scanned forms. Routine anticipatory guidance given to patient. See health maintenance. Flu 2015 Shingles 2013 PNA prev done, updated today Tetanus 2013 Colonoscopy not indicated given age. Pt agrees.  Prostate cancer screening not indicated given age, prev neg biopsy x2. Pt agrees. Prostate cancer screening and PSA options (with potential risks and benefits of testing vs not testing) were discussed along with recent recs/guidelines. He declined testing PSA at this point. Advance directive d/w pt, on file.  Would have wife or daughter designated if patient were incapacitated.  Cognitive function addressed- see scanned forms- and if abnormal then additional documentation follows.

## 2014-08-24 NOTE — Assessment & Plan Note (Signed)
D/w pt.  Stable over the last few years.  D/w pt about avoiding nsaids.  Continue current meds.

## 2014-08-24 NOTE — Assessment & Plan Note (Signed)
Similar to prev, labs d/w pt.  He'll work on lowering carbs in diet.

## 2014-08-28 ENCOUNTER — Telehealth: Payer: Self-pay

## 2014-08-28 NOTE — Telephone Encounter (Signed)
Pt left v/m; pt was seen 08/23/14 and pt knows Dr Damita Dunnings presently out of town and pt request cb upon Dr Josefine Class return; At visit Dr Damita Dunnings told pt his kidney function was at 40 % functioning. Pt wants to know if should have urinalysis done or not. Pt wants answer from Dr Damita Dunnings not another physician and pt said cb next week is OK.

## 2014-08-29 NOTE — Telephone Encounter (Signed)
Not needed.  Thanks.

## 2014-08-31 NOTE — Telephone Encounter (Signed)
Left message on machine for patient to return our call 

## 2014-09-03 NOTE — Telephone Encounter (Signed)
Patient advised.

## 2014-09-04 ENCOUNTER — Encounter: Payer: Self-pay | Admitting: Family Medicine

## 2014-11-16 ENCOUNTER — Telehealth: Payer: Self-pay

## 2014-11-16 NOTE — Telephone Encounter (Signed)
Since he has a h/o Barrett's esophagus, I wouldn't stop the nexium w/o having him talk to GI first.  I would ask that he call GI for their input before anything was changed.  Thanks.

## 2014-11-16 NOTE — Telephone Encounter (Signed)
Patient advised.

## 2014-11-16 NOTE — Telephone Encounter (Signed)
Pt left v/m; Justin Ford has done study that Nexium and similar drugs can cause problems with kidney function; pt is at 40 % and pt wants to stop Nexium and wants to know what med to substitute for Nexium.pt request cb.

## 2014-11-26 DIAGNOSIS — K2270 Barrett's esophagus without dysplasia: Secondary | ICD-10-CM | POA: Diagnosis not present

## 2014-12-20 DIAGNOSIS — X32XXXA Exposure to sunlight, initial encounter: Secondary | ICD-10-CM | POA: Diagnosis not present

## 2014-12-20 DIAGNOSIS — D225 Melanocytic nevi of trunk: Secondary | ICD-10-CM | POA: Diagnosis not present

## 2014-12-20 DIAGNOSIS — L570 Actinic keratosis: Secondary | ICD-10-CM | POA: Diagnosis not present

## 2014-12-20 DIAGNOSIS — D2262 Melanocytic nevi of left upper limb, including shoulder: Secondary | ICD-10-CM | POA: Diagnosis not present

## 2014-12-20 DIAGNOSIS — Z85828 Personal history of other malignant neoplasm of skin: Secondary | ICD-10-CM | POA: Diagnosis not present

## 2015-01-25 NOTE — Op Note (Signed)
PATIENT NAME:  Justin Ford, Justin Ford MR#:  [id number] DATE OF BIRTH:  04-03-1930  DATE OF PROCEDURE:  10/10/2012  DATE OF DICTATION: 10/10/2012    PREOPERATIVE DIAGNOSIS: Cataract, right eye.   POSTOPERATIVE DIAGNOSIS: Cataract, right eye.   PROCEDURE PERFORMED: Extracapsular cataract extraction using phacoemulsification with placement Alcon [id number] 14.5 diopter posterior chamber lens, serial number [id number].   SURGEON: Loura Back. Ailea Rhatigan, M.D.   ANESTHESIA: 4% lidocaine and 0.75% Marcaine a 50:50 mixture with 10 units/mL of Hyalex added, given as a peribulbar.   ANESTHESIOLOGIST: Dr. Andree Elk.   COMPLICATIONS: None.   ESTIMATED BLOOD LOSS: Less than 1 mL.   DESCRIPTION OF PROCEDURE: The patient was brought to the operating room and given a peribulbar block. The patient was then prepped and draped in the usual fashion. The vertical rectus muscles were imbricated using 5-0 silk sutures. These sutures were then clamped to the sterile drapes as bridle sutures. A limbal peritomy was performed extending two clock hours and hemostasis was obtained with cautery.  A partial thickness scleral groove was made at the surgical limbus and then dissected anteriorly in a lamellar dissection with using an Alcon crescent knife. The anterior chamber was entered superonasally with a Superblade and through the lamellar dissection with a 2.6-mm keratome.  DisCoVisc was used to replace the aqueous and a continuous tear capsulorrhexis was carried out. Hydrodissection and hydrodelineation were carried out with balanced salt and a 27 gauge canula. The nucleus was rotated to confirm the effectiveness of the hydrodissection. Phacoemulsification was carried out using a divide-and-conquer technique. Total ultrasound time was 1 minute and 16 seconds with an average power of 17%, CDE of 21.91.  Irrigation/aspiration was used to remove the residual cortex. DisCoVisc was used to inflate the capsule and the internal wound was  enlarged to 3 mm with the crescent knife. The intraocular lens was inserted into the capsular bag using the AcrySert delivery system. Irrigation/aspiration was used to remove the residual DisCoVisc.  Miostat was injected into the anterior chamber through the paracentesis track to inflate the anterior chamber and induce miosis. Cefuroxime 0.1 mL was placed in the anterior chamber prior to checking the wound for leaks. The wound was checked for leaks, and wound leakage was found. A single 10-0 suture was placed across the incision, tied and the knot was rotated superiorly. The conjunctiva was closed with cautery and the bridle sutures were removed. Two drops of 0.3% Vigamox were placed on the eye. An eye shield was placed on the eye.  The patient was discharged to the recovery room in good condition.  ____________________________ Loura Back Lenn Volker, MD sad:OSi D: 10/10/2012 13:47:29 ET T: 10/11/2012 06:18:58 ET JOB#: [id number]  cc: Remo Lipps A. Shakelia Scrivner, MD, <Dictator> Martie Lee MD ELECTRONICALLY SIGNED 10/17/2012 13:10

## 2015-01-26 NOTE — Op Note (Signed)
PATIENT NAME:  Justin Ford, ABILA MR#:  [id number] DATE OF BIRTH:  [birth year]  DATE OF PROCEDURE:  08/06/2014  PREOPERATIVE DIAGNOSIS: Cataract, left eye.   POSTOPERATIVE DIAGNOSIS: Cataract, left eye.   PROCEDURE PERFORMED: Extracapsular cataract extraction using phacoemulsification with placement of Alcon [id number], 15.0 diopter posterior chamber lens, serial number [id number].   SURGEON: Loura Back. Jorie Zee, MD   ANESTHESIA: Lidocaine 4%, 0.75% Marcaine, a 50-50 mixture with 10 units/mL of Hylenex added, given as a peribulbar.   ANESTHESIOLOGIST: Alvin Critchley, MD  COMPLICATIONS: None.   ESTIMATED BLOOD LOSS: Less than 1 mL.   DESCRIPTION OF PROCEDURE:  The patient was brought to the operating room and given a peribulbar block.  The patient was then prepped and draped in the usual fashion.  The vertical rectus muscles were imbricated using 5-0 silk sutures.  These sutures were then clamped to the sterile drapes as bridle sutures.  A limbal peritomy was performed extending two clock hours and hemostasis was obtained with cautery.  A partial thickness scleral groove was made at the surgical limbus and dissected anteriorly in a lamellar dissection using an Alcon crescent knife.  The anterior chamber was entered supero-temporally with a Superblade and through the lamellar dissection with a 2.6 mm keratome.  DisCoVisc was used to replace the aqueous and a continuous tear capsulorrhexis was carried out.  Hydrodissection and hydrodelineation were carried out with balanced salt and a 27 gauge canula.  The nucleus was rotated to confirm the effectiveness of the hydrodissection.  Phacoemulsification was carried out using a divide-and-conquer technique.  Total ultrasound time was 1 minute and 7 seconds with an average power of  22.8 percent. CDE of 26.98. No suture was placed.  Irrigation/aspiration was used to remove the residual cortex.  DisCoVisc was used to inflate the capsule and the internal incision  was enlarged to 3 mm with the crescent knife.  The intraocular lens was folded and inserted into the capsular bag using the [employer] delivery system instead of a [employer].  Irrigation/aspiration was used to remove the residual DisCoVisc.  Miostat was injected into the anterior chamber through the paracentesis track to inflate the anterior chamber and induce miosis.   One-tenth millimeter of cefuroxime containing 1 mg of drug was injected via the paracentesis tract. The wound was checked for leaks and none were found. The conjunctiva was closed with cautery and the bridle sutures were removed.  Two drops of 0.3% Vigamox were placed on the eye.   An eye shield was placed on the eye.  The patient was discharged to the recovery room in good condition.   ____________________________ Loura Back Trissa Molina, MD sad:LT D: 08/06/2014 13:25:16 ET T: 08/06/2014 22:04:31 ET JOB#: [id number]  cc: Remo Lipps A. Manilla Strieter, MD, <Dictator> Martie Lee MD ELECTRONICALLY SIGNED 08/13/2014 13:25

## 2015-04-16 DIAGNOSIS — Z961 Presence of intraocular lens: Secondary | ICD-10-CM | POA: Diagnosis not present

## 2015-05-07 ENCOUNTER — Ambulatory Visit (INDEPENDENT_AMBULATORY_CARE_PROVIDER_SITE_OTHER): Payer: Medicare Other | Admitting: Family Medicine

## 2015-05-07 ENCOUNTER — Encounter: Payer: Self-pay | Admitting: Family Medicine

## 2015-05-07 VITALS — BP 124/80 | HR 72 | Temp 98.1°F

## 2015-05-07 DIAGNOSIS — K219 Gastro-esophageal reflux disease without esophagitis: Secondary | ICD-10-CM

## 2015-05-07 MED ORDER — ESOMEPRAZOLE MAGNESIUM 40 MG PO CPDR: DELAYED_RELEASE_CAPSULE | ORAL | Status: DC

## 2015-05-07 NOTE — Progress Notes (Signed)
Pre visit review using our clinic review tool, if applicable. No additional management support is needed unless otherwise documented below in the visit note.  He's off PPI after discussion with GI.  This was a trial based on his request with GI.   He is off BP and cholesterol meds.  We agreed to recheck labs later in the year.   He has been noting a sensation of a knot in his throat.  No visible lesion.  He had the same sx during tax time back in the distant past.  He had a recent stressful situation and that may have contributed (wife recent inpatient for TIA).  No exertional chest pain.  Sx can happen at rest.  Not SOB.  No BLE edema.  No vomiting.  No taste changes.  Still swallowing well.  No FCNAVD.  No blood in stool.    He occ has some heartburn and tums helps that. He has been belching more.    Meds, vitals, and allergies reviewed.   ROS: See HPI.  Otherwise, noncontributory.  GEN: nad, alert and oriented HEENT: mucous membranes moist NECK: supple w/o LA CV: rrr. PULM: ctab, no inc wob ABD: soft, +bs EXT: no edema

## 2015-05-07 NOTE — Assessment & Plan Note (Signed)
This may be GERD sx with contribution from recent stressful situation. D/w pt.  Restart PPI and update me as needed.  No sig findings on exam. He agrees.

## 2015-05-07 NOTE — Patient Instructions (Signed)
I don't see anything alarming.  Try the nexium and see if that helps.  Take care.  Glad to see you.

## 2015-06-21 ENCOUNTER — Emergency Department: Payer: Medicare Other

## 2015-06-21 ENCOUNTER — Observation Stay
Admission: EM | Admit: 2015-06-21 | Discharge: 2015-06-23 | Disposition: A | Discharge status: Home / Self Care | Payer: Medicare Other | Attending: Internal Medicine | Admitting: Internal Medicine

## 2015-06-21 ENCOUNTER — Encounter: Payer: Self-pay | Admitting: Emergency Medicine

## 2015-06-21 DIAGNOSIS — I6782 Cerebral ischemia: Secondary | ICD-10-CM | POA: Diagnosis not present

## 2015-06-21 DIAGNOSIS — K2270 Barrett's esophagus without dysplasia: Secondary | ICD-10-CM | POA: Diagnosis not present

## 2015-06-21 DIAGNOSIS — R05 Cough: Secondary | ICD-10-CM | POA: Insufficient documentation

## 2015-06-21 DIAGNOSIS — G8191 Hemiplegia, unspecified affecting right dominant side: Secondary | ICD-10-CM | POA: Insufficient documentation

## 2015-06-21 DIAGNOSIS — E785 Hyperlipidemia, unspecified: Secondary | ICD-10-CM | POA: Diagnosis not present

## 2015-06-21 DIAGNOSIS — Z811 Family history of alcohol abuse and dependence: Secondary | ICD-10-CM | POA: Diagnosis not present

## 2015-06-21 DIAGNOSIS — Z85828 Personal history of other malignant neoplasm of skin: Secondary | ICD-10-CM | POA: Insufficient documentation

## 2015-06-21 DIAGNOSIS — R739 Hyperglycemia, unspecified: Secondary | ICD-10-CM | POA: Diagnosis not present

## 2015-06-21 DIAGNOSIS — Z8042 Family history of malignant neoplasm of prostate: Secondary | ICD-10-CM | POA: Insufficient documentation

## 2015-06-21 DIAGNOSIS — M720 Palmar fascial fibromatosis [Dupuytren]: Secondary | ICD-10-CM | POA: Diagnosis not present

## 2015-06-21 DIAGNOSIS — Z87442 Personal history of urinary calculi: Secondary | ICD-10-CM | POA: Diagnosis not present

## 2015-06-21 DIAGNOSIS — N183 Chronic kidney disease, stage 3 (moderate): Secondary | ICD-10-CM | POA: Diagnosis not present

## 2015-06-21 DIAGNOSIS — Z7902 Long term (current) use of antithrombotics/antiplatelets: Secondary | ICD-10-CM | POA: Insufficient documentation

## 2015-06-21 DIAGNOSIS — I639 Cerebral infarction, unspecified: Secondary | ICD-10-CM

## 2015-06-21 DIAGNOSIS — K219 Gastro-esophageal reflux disease without esophagitis: Secondary | ICD-10-CM | POA: Insufficient documentation

## 2015-06-21 DIAGNOSIS — G459 Transient cerebral ischemic attack, unspecified: Principal | ICD-10-CM | POA: Insufficient documentation

## 2015-06-21 DIAGNOSIS — I129 Hypertensive chronic kidney disease with stage 1 through stage 4 chronic kidney disease, or unspecified chronic kidney disease: Secondary | ICD-10-CM | POA: Insufficient documentation

## 2015-06-21 DIAGNOSIS — Z7982 Long term (current) use of aspirin: Secondary | ICD-10-CM | POA: Insufficient documentation

## 2015-06-21 DIAGNOSIS — Z79899 Other long term (current) drug therapy: Secondary | ICD-10-CM | POA: Diagnosis not present

## 2015-06-21 DIAGNOSIS — R4701 Aphasia: Secondary | ICD-10-CM | POA: Insufficient documentation

## 2015-06-21 DIAGNOSIS — R531 Weakness: Secondary | ICD-10-CM | POA: Diagnosis not present

## 2015-06-21 DIAGNOSIS — I10 Essential (primary) hypertension: Secondary | ICD-10-CM | POA: Diagnosis not present

## 2015-06-21 DIAGNOSIS — Z87891 Personal history of nicotine dependence: Secondary | ICD-10-CM | POA: Insufficient documentation

## 2015-06-21 DIAGNOSIS — Z8249 Family history of ischemic heart disease and other diseases of the circulatory system: Secondary | ICD-10-CM | POA: Diagnosis not present

## 2015-06-21 DIAGNOSIS — Z8489 Family history of other specified conditions: Secondary | ICD-10-CM | POA: Insufficient documentation

## 2015-06-21 DIAGNOSIS — R471 Dysarthria and anarthria: Secondary | ICD-10-CM | POA: Diagnosis not present

## 2015-06-21 LAB — CBC WITH DIFFERENTIAL/PLATELET
BASOS PCT: 2 %
Basophils Absolute: 0.1 10*3/uL (ref 0–0.1)
EOS ABS: 0.4 10*3/uL (ref 0–0.7)
EOS PCT: 6 %
HCT: 44.2 % (ref 40.0–52.0)
HEMOGLOBIN: 15.1 g/dL (ref 13.0–18.0)
Lymphocytes Relative: 29 %
Lymphs Abs: 1.8 10*3/uL (ref 1.0–3.6)
MCH: 30.3 pg (ref 26.0–34.0)
MCHC: 34.2 g/dL (ref 32.0–36.0)
MCV: 88.6 fL (ref 80.0–100.0)
MONOS PCT: 8 %
Monocytes Absolute: 0.5 10*3/uL (ref 0.2–1.0)
NEUTROS PCT: 55 %
Neutro Abs: 3.5 10*3/uL (ref 1.4–6.5)
PLATELETS: 170 10*3/uL (ref 150–440)
RBC: 4.99 MIL/uL (ref 4.40–5.90)
RDW: 14.1 % (ref 11.5–14.5)
WBC: 6.2 10*3/uL (ref 3.8–10.6)

## 2015-06-21 LAB — COMPREHENSIVE METABOLIC PANEL
ALK PHOS: 87 U/L (ref 38–126)
ALT: 22 U/L (ref 17–63)
ANION GAP: 7 (ref 5–15)
AST: 30 U/L (ref 15–41)
Albumin: 3.9 g/dL (ref 3.5–5.0)
BILIRUBIN TOTAL: 0.6 mg/dL (ref 0.3–1.2)
BUN: 18 mg/dL (ref 6–20)
CALCIUM: 9.4 mg/dL (ref 8.9–10.3)
CO2: 22 mmol/L (ref 22–32)
CREATININE: 1.47 mg/dL — AB (ref 0.61–1.24)
Chloride: 111 mmol/L (ref 101–111)
GFR calc non Af Amer: 43 mL/min — ABNORMAL LOW (ref >60)
GFR, EST AFRICAN AMERICAN: 49 mL/min — AB (ref >60)
GLUCOSE: 112 mg/dL — AB (ref 65–99)
Potassium: 4.1 mmol/L (ref 3.5–5.1)
Sodium: 140 mmol/L (ref 135–145)
TOTAL PROTEIN: 7.6 g/dL (ref 6.5–8.1)

## 2015-06-21 LAB — TROPONIN I: Troponin I: <0.03 ng/mL (ref <0.031)

## 2015-06-21 MED ORDER — ACETAMINOPHEN 325 MG PO TABS
650.0000 mg | ORAL_TABLET | Freq: Four times a day (QID) | ORAL | Status: DC | PRN
  Administered 2015-06-22 – 2015-06-23 (×2): 650 mg via ORAL
  Filled 2015-06-21 (×2): qty 2

## 2015-06-21 MED ORDER — ONDANSETRON HCL 4 MG PO TABS: 4.0000 mg | ORAL_TABLET | Freq: Four times a day (QID) | ORAL | Status: DC | PRN

## 2015-06-21 MED ORDER — ONDANSETRON HCL 4 MG/2ML IJ SOLN: 4.0000 mg | Freq: Four times a day (QID) | INTRAMUSCULAR | Status: DC | PRN

## 2015-06-21 MED ORDER — ACETAMINOPHEN 500 MG PO TABS: 1000.0000 mg | ORAL_TABLET | Freq: Four times a day (QID) | ORAL | Status: DC | PRN

## 2015-06-21 MED ORDER — SODIUM CHLORIDE 0.9 % IJ SOLN
3.0000 mL | Freq: Two times a day (BID) | INTRAMUSCULAR | Status: DC
  Administered 2015-06-21 – 2015-06-22 (×3): 3 mL via INTRAVENOUS

## 2015-06-21 MED ORDER — HYDRALAZINE HCL 20 MG/ML IJ SOLN
10.0000 mg | Freq: Four times a day (QID) | INTRAMUSCULAR | Status: DC | PRN
  Administered 2015-06-21: 10 mg via INTRAVENOUS
  Filled 2015-06-21: qty 1

## 2015-06-21 MED ORDER — LISINOPRIL 10 MG PO TABS
10.0000 mg | ORAL_TABLET | Freq: Every day | ORAL | Status: DC
  Administered 2015-06-21 – 2015-06-23 (×3): 10 mg via ORAL
  Filled 2015-06-21 (×3): qty 1

## 2015-06-21 MED ORDER — PANTOPRAZOLE SODIUM 40 MG PO TBEC
40.0000 mg | DELAYED_RELEASE_TABLET | Freq: Every day | ORAL | Status: DC
  Administered 2015-06-22 – 2015-06-23 (×2): 40 mg via ORAL
  Filled 2015-06-21 (×2): qty 1

## 2015-06-21 MED ORDER — ACETAMINOPHEN 650 MG RE SUPP: 650.0000 mg | Freq: Four times a day (QID) | RECTAL | Status: DC | PRN

## 2015-06-21 MED ORDER — ASPIRIN EC 81 MG PO TBEC
81.0000 mg | DELAYED_RELEASE_TABLET | Freq: Every day | ORAL | Status: DC
  Administered 2015-06-22 – 2015-06-23 (×2): 81 mg via ORAL
  Filled 2015-06-21 (×2): qty 1

## 2015-06-21 NOTE — Plan of Care (Signed)
Problem: Discharge/Transitional Outcomes Goal: Barriers To Progression Addressed/Resolved Individualization of care - Likes to be called Nadara Mustard. - Lives at home with wife. - Has history of GERD, hypertension and hyperlipidemia, controlled by home medications.

## 2015-06-21 NOTE — ED Notes (Signed)
Admitting MD at bedside.

## 2015-06-21 NOTE — H&P (Signed)
[facility] NAME: Justin Ford    MR#:  [id number]  DATE OF BIRTH:  01-Aug-1930  DATE OF ADMISSION:  06/21/2015  PRIMARY CARE PHYSICIAN: Elsie Stain, MD   REQUESTING/REFERRING PHYSICIAN: Dr. Harvest Dark  CHIEF COMPLAINT:   Chief Complaint  Patient presents with  . Transient Ischemic Attack   right hand arm weakness, aphasia  HISTORY OF PRESENT ILLNESS:  Justin Ford  is a 81 y.o. male with a known history of hypertension, hyperlipidemia, GERD, skin cancer, nephrolithiasis, who presents to the hospital due to right hand weakness and aphasia. Patient apparently this afternoon was going to pick up his granddaughter from school and on the driver's door and noticed that his right hand and arm were little weak. He then shortly thereafter developed aphasia where he couldn't get the words out properly. His granddaughter noticed he was not acting himself and asked him to pull over. She then called EMS and he was brought to the ER for further evaluation. By that time the patient arrived to the ER his symptoms have completely resolved and he is clinically asymptomatic presently. He denied any headache, nausea, vomiting, chest pain, numbness tingling or any other associated symptoms. Patient denies ever having a previous CVA or MI in the past.  Hospitalist services were contacted further treatment and evaluation  PAST MEDICAL HISTORY:   Past Medical History  Diagnosis Date  . Blood in stool     colonoscopy done ~2010  . GERD (gastroesophageal reflux disease)   . Hypertension   . Hyperlipidemia   . Cancer     basal cell CA per derm  . Kidney stones   . Barrett's esophagus     PAST SURGICAL HISTORY:   Past Surgical History  Procedure Laterality Date  . Shoulder surgery      per Dr. Theda Sers, R shoulder  . Prostate biopsy      neg x2 per patient    SOCIAL HISTORY:   Social History  Substance Use Topics  . Smoking  status: Former Smoker -- 0.25 packs/day for 4 years    Quit date: 10/05/1950  . Smokeless tobacco: Never Used  . Alcohol Use: 1.2 oz/week    2 Cans of beer per week     Comment: occ    FAMILY HISTORY:   Family History  Problem Relation Age of Onset  . Dementia Mother   . Alcohol abuse Father   . Cancer Sister     possible breast cancer, pt was unsure  . Heart disease Brother   . Colon cancer Neg Hx   . Prostate cancer Neg Hx     DRUG ALLERGIES:   Allergies  Allergen Reactions  . Lipitor [Atorvastatin] Other (See Comments)    Reaction:  Unknown     REVIEW OF SYSTEMS:   Review of Systems  Constitutional: Negative for fever and weight loss.  HENT: Negative for congestion, nosebleeds and tinnitus.   Eyes: Negative for blurred vision, double vision and redness.  Respiratory: Negative for cough, hemoptysis and shortness of breath.   Cardiovascular: Negative for chest pain, orthopnea, leg swelling and PND.  Gastrointestinal: Negative for nausea, vomiting, abdominal pain, diarrhea and melena.  Genitourinary: Negative for dysuria, urgency and hematuria.  Musculoskeletal: Negative for joint pain and falls.  Neurological: Positive for focal weakness (right arm). Negative for dizziness, tingling, sensory change, seizures, weakness and headaches.       Aphasia  Endo/Heme/Allergies: Negative for polydipsia. Does not  bruise/bleed easily.  Psychiatric/Behavioral: Negative for depression and memory loss. The patient is not nervous/anxious.     MEDICATIONS AT HOME:   Prior to Admission medications   Medication Sig Start Date End Date Taking? Authorizing Provider  acetaminophen (TYLENOL) 500 MG tablet Take 1,000 mg by mouth every 6 (six) hours as needed for mild pain.   Yes Historical Provider, MD  esomeprazole (NEXIUM) 40 MG capsule Take 40 mg by mouth daily.    Yes Historical Provider, MD      VITAL SIGNS:  Blood pressure 186/84, pulse 61, temperature 98 F (36.7 C),  temperature source Oral, resp. rate 16, height 5' 7.5" (1.715 m), weight 82.555 kg (182 lb), SpO2 96 %.  PHYSICAL EXAMINATION:  Physical Exam  GENERAL:  81 y.o.-year-old patient lying in the bed with no acute distress.  EYES: Pupils equal, round, reactive to light and accommodation. No scleral icterus. Extraocular muscles intact.  HEENT: Head atraumatic, normocephalic. Oropharynx and nasopharynx clear. No oropharyngeal erythema, moist oral mucosa  NECK:  Supple, no jugular venous distention. No thyroid enlargement, no tenderness.  LUNGS: Normal breath sounds bilaterally, no wheezing, rales, rhonchi. No use of accessory muscles of respiration.  CARDIOVASCULAR: S1, S2 RRR. No murmurs, rubs, gallops, clicks.  ABDOMEN: Soft, nontender, nondistended. Bowel sounds present. No organomegaly or mass.  EXTREMITIES: No pedal edema, cyanosis, or clubbing. + 2 pedal & radial pulses b/l.   NEUROLOGIC: Cranial nerves II through XII are intact. No focal Motor or sensory deficits appreciated b/l PSYCHIATRIC: The patient is alert and oriented x 3. Good affect.  SKIN: No obvious rash, lesion, or ulcer.   LABORATORY PANEL:   CBC  Recent Labs Lab 06/21/15 1730  WBC 6.2  HGB 15.1  HCT 44.2  PLT 170   ------------------------------------------------------------------------------------------------------------------  Chemistries   Recent Labs Lab 06/21/15 1730  NA 140  K 4.1  CL 111  CO2 22  GLUCOSE 112*  BUN 18  CREATININE 1.47*  CALCIUM 9.4  AST 30  ALT 22  ALKPHOS 87  BILITOT 0.6   ------------------------------------------------------------------------------------------------------------------  Cardiac Enzymes  Recent Labs Lab 06/21/15 1730  TROPONINI <0.03   ------------------------------------------------------------------------------------------------------------------  RADIOLOGY:  Ct Head Wo Contrast  06/21/2015   CLINICAL DATA:  Episode of right hand weakness and  difficulty speaking today.  EXAM: CT HEAD WITHOUT CONTRAST  TECHNIQUE: Contiguous axial images were obtained from the base of the skull through the vertex without intravenous contrast.  COMPARISON:  09/26/2010  FINDINGS: The ventricles and cisterns are within normal. There is mild prominence of the CSF spaces unchanged likely age related atrophy. There is moderate chronic ischemic microvascular disease. No evidence of mass, mass effect, shift of midline structures or acute hemorrhage. No evidence to suggest acute infarction. Remaining bones and soft tissues are within normal.  IMPRESSION: No acute intracranial findings.  Chronic ischemic microvascular disease and mild age related atrophic change.   Electronically Signed   By: Marin Olp M.D.   On: 06/21/2015 18:14     IMPRESSION AND PLAN:   81 year old male with past medical history of hypertension, hyperlipidemia, GERD, Barrett's esophagus, who presented to the hospital due to right arm weakness and aphasia.  #1 TIA-this is a likely diagnosis given patient's transient neurological symptoms of aphasia and right arm weakness which have not resolved. -Patient's CT head in the admission is negative. I will get an MRI of the brain, carotid duplex, 2-D two-dimensional echocardiogram. -Continue aspirin, follow every 2 hours neuro checks.  I will check a lipid  profile.  #2 accelerated hypertension-patient has a history of hypertension but hasn't taken his medications and now 2 weeks. Some of this is noncompliance and some of it is probably related to his possible acute CVA. -We will tolerate some element of hypertension given possible TIA/CVA. -I will start the patient on low-dose lisinopril, add some when necessary hydralazine. Follow hemodynamics.  #3 GERD-continue Protonix.   All the records are reviewed and case discussed with ED provider. Management plans discussed with the patient, family and they are in agreement.  CODE STATUS: Full  TOTAL  TIME TAKING CARE OF THIS PATIENT: 45 minutes.    Henreitta Leber M.D on 06/21/2015 at 8:05 PM  Between 7am to 6pm - Pager - [phone]  After 6pm go to [email or web address] - password [email or web address] [facility]  Office  [phone]  CC: Primary care physician; Elsie Stain, MD

## 2015-06-21 NOTE — ED Provider Notes (Signed)
[facility] Emergency Department [facility] Note  Time seen: 5:28 PM  I have reviewed the triage vital signs and the nursing notes.   HISTORY  Chief Complaint Transient Ischemic Attack    HPI Justin Ford is a 81 y.o. male with a past medical history of hypertension, hyperlipidemia presents the emergency department with right arm weakness and trouble speaking. Patient states at 3:40 PM he developed right arm weakness where he was not able to lift his arm. He notes around 4 PM he picked up his granddaughter from school and could not converse. He states he knew what he was trying to say but the words were coming out wrong. His granddaughter became very concerned and told him to pull over, and the patient came to the emergency department for evaluation. Patient states approximately 30-40 minutes after this occurred he regained full use of his right arm, and was able to speak without difficulty. Denies any headache at any point. Denies any pain at any time. Describes his weakness and dysarthria as severe/significant, but now completely resolved. No history of CVA.    Past Medical History  Diagnosis Date  . Blood in stool     colonoscopy done ~2010  . GERD (gastroesophageal reflux disease)   . Hypertension   . Hyperlipidemia   . Cancer     basal cell CA per derm  . Kidney stones   . Barrett's esophagus     Patient Active Problem List   Diagnosis Date Noted  . Hyperglycemia 08/09/2013  . Trochanteric bursitis 08/09/2013  . Trigger finger 06/21/2012  . Advance directive on file 03/22/2012  . Medicare annual wellness visit, subsequent 03/15/2012  . Dupuytren contracture 03/15/2012  . Cough 12/11/2011  . Barrett esophagus 10/12/2011  . Chronic kidney disease, stage III (moderate) 10/12/2011  . HTN (hypertension) 09/27/2011  . HLD (hyperlipidemia) 09/27/2011  . GERD (gastroesophageal reflux disease) 09/27/2011    Past Surgical History  Procedure Laterality  Date  . Shoulder surgery      per Dr. Theda Sers, R shoulder  . Prostate biopsy      neg x2 per patient    Current Outpatient Rx  Name  Route  Sig  Dispense  Refill  . aspirin 500 MG buffered tablet   Oral   Take 500 mg by mouth every 6 (six) hours as needed for pain.         [name] esomeprazole (NEXIUM) 40 MG capsule      TAKE 1 CAPSULE BY MOUTH EVERY DAY   90 capsule   1     Allergies Lipitor  Family History  Problem Relation Age of Onset  . Dementia Mother   . Alcohol abuse Father   . Cancer Sister     possible breast cancer, pt was unsure  . Heart disease Brother   . Colon cancer Neg Hx   . Prostate cancer Neg Hx     Social History Social History  Substance Use Topics  . Smoking status: Former Smoker    Quit date: 10/05/1950  . Smokeless tobacco: Never Used  . Alcohol Use: Yes     Comment: occ    Review of Systems Constitutional: Negative for fever. Trouble speaking, now resolved. Eyes: Negative for visual changes. Cardiovascular: Negative for chest pain. Respiratory: Negative for shortness of breath. Gastrointestinal: Negative for abdominal pain Neurological: Right arm weakness now resolved 10-point ROS otherwise negative.  ____________________________________________   PHYSICAL EXAM:  VITAL SIGNS: ED Triage Vitals  Enc Vitals Group  BP 06/21/15 1710 178/92 mmHg     Pulse Rate 06/21/15 1710 61     Resp 06/21/15 1710 16     Temp 06/21/15 1710 98 F (36.7 C)     Temp Source 06/21/15 1710 Oral     SpO2 06/21/15 1710 96 %     Weight 06/21/15 1710 182 lb (82.555 kg)     Height 06/21/15 1710 5' 7.5" (1.715 m)     Head Cir --      Peak Flow --      Pain Score 06/21/15 1711 0     Pain Loc --      Pain Edu? --      Excl. in [place]? --     Constitutional: Alert and oriented. Well appearing and in no distress. Eyes: Normal exam, 2 mm PERRL bilaterally. ENT   Head: Normocephalic and atraumatic   Mouth/Throat: Mucous membranes are  moist. Cardiovascular: Normal rate, regular rhythm. No murmur Respiratory: Normal respiratory effort without tachypnea nor retractions. Breath sounds are clear and equal bilaterally. No wheezes/rales/rhonchi. Gastrointestinal: Soft and nontender. No distention.   Musculoskeletal: Nontender with normal range of motion in all extremities. Neurologic:  Normal speech and language. No gross focal neurologic deficits are appreciated. Speech is normal. No pronator drift.//5 motor in all extremities. Skin:  Skin is warm, dry and intact.  Psychiatric: Mood and affect are normal. Speech and behavior are normal. Patient exhibits appropriate insight and judgment.  ____________________________________________    EKG  EKG reviewed and interpreted by myself shows sinus bradycardia at 55 bpm, slightly widened QRS, normal axis, normal intervals and no ST changes present. QRS morphology consistent with right bundle-branch block.  ____________________________________________    RADIOLOGY  CT head shows no acute abnormality  ____________________________________________   INITIAL IMPRESSION / ASSESSMENT AND PLAN / ED COURSE  Pertinent labs & imaging results that were available during my care of the patient were reviewed by me and considered in my medical decision making (see chart for details).  Patient presents with signs and symptoms of a transient ischemic attack, no deficits currently on exam. We will check labs, CT head, and closely monitor in the emergency department. NIH stroke scale of 0 currently.  CT and labs are within normal limits, given the patient's symptoms, clear onset, and clear resolution will admit the patient for a likely transient ischemic attack.    NIH Stroke Scale    Time: 5:31 PM Person Administering Scale: PADUCHOWSKI, KEVIN  Administer stroke scale items in the order listed. Record performance in each category after each subscale exam. Do not go back and change  scores. Follow directions provided for each exam technique. Scores should reflect what the patient does, not what the clinician thinks the patient can do. The clinician should record answers while administering the exam and work quickly. Except where indicated, the patient should not be coached (i.e., repeated requests to patient to make a special effort).   1a  Level of consciousness: 0=alert; keenly responsive  1b. LOC questions:  0=Performs both tasks correctly  1c. LOC commands: 0=Performs both tasks correctly  2.  Best Gaze: 0=normal  3.  Visual: 0=No visual loss  4. Facial Palsy: 0=Normal symmetric movement  5a.  Motor left arm: 0=No drift, limb holds 90 (or 45) degrees for full 10 seconds  5b.  Motor right arm: 0=No drift, limb holds 90 (or 45) degrees for full 10 seconds  6a. motor left leg: 0=No drift, limb holds 90 (or 45) degrees for  full 10 seconds  6b  Motor right leg:  0=No drift, limb holds 90 (or 45) degrees for full 10 seconds  7. Limb Ataxia: 0=Absent  8.  Sensory: 0=Normal; no sensory loss  9. Best Language:  0=No aphasia, normal  10. Dysarthria: 0=Normal  11. Extinction and Inattention: 0=No abnormality  12. Distal motor function: 0=Normal   Total:   0    ____________________________________________   FINAL CLINICAL IMPRESSION(S) / ED DIAGNOSES  Transient ischemic attack   Harvest Dark, MD 06/21/15 2320

## 2015-06-21 NOTE — ED Notes (Signed)
Patient presents to the ED after having an episode of right hand weakness and difficulty speaking.  Patient states at 3:40pm today he couldn't lift his right arm up and then at 4:05 he was having difficulty speaking.  Patient's grip strengths are very strong and pedal and dorsi flexion are strong.  Patient is speaking in full sentences and states he is feeling much better than he was.

## 2015-06-22 ENCOUNTER — Observation Stay (HOSPITAL_BASED_OUTPATIENT_CLINIC_OR_DEPARTMENT_OTHER)
Admit: 2015-06-22 | Discharge: 2015-06-22 | Disposition: A | Discharge status: Home / Self Care | Payer: Medicare Other | Attending: Specialist | Admitting: Specialist

## 2015-06-22 ENCOUNTER — Observation Stay: Payer: Medicare Other

## 2015-06-22 DIAGNOSIS — I63231 Cerebral infarction due to unspecified occlusion or stenosis of right carotid arteries: Secondary | ICD-10-CM | POA: Diagnosis not present

## 2015-06-22 DIAGNOSIS — I63232 Cerebral infarction due to unspecified occlusion or stenosis of left carotid arteries: Secondary | ICD-10-CM | POA: Diagnosis not present

## 2015-06-22 DIAGNOSIS — I10 Essential (primary) hypertension: Secondary | ICD-10-CM | POA: Diagnosis not present

## 2015-06-22 DIAGNOSIS — G459 Transient cerebral ischemic attack, unspecified: Secondary | ICD-10-CM

## 2015-06-22 DIAGNOSIS — K219 Gastro-esophageal reflux disease without esophagitis: Secondary | ICD-10-CM | POA: Diagnosis not present

## 2015-06-22 DIAGNOSIS — R4701 Aphasia: Secondary | ICD-10-CM | POA: Diagnosis not present

## 2015-06-22 LAB — CBC
HCT: 41.6 % (ref 40.0–52.0)
Hemoglobin: 14.5 g/dL (ref 13.0–18.0)
MCH: 31 pg (ref 26.0–34.0)
MCHC: 34.9 g/dL (ref 32.0–36.0)
MCV: 88.9 fL (ref 80.0–100.0)
Platelets: 161 10*3/uL (ref 150–440)
RBC: 4.68 MIL/uL (ref 4.40–5.90)
RDW: 13.7 % (ref 11.5–14.5)
WBC: 6.1 10*3/uL (ref 3.8–10.6)

## 2015-06-22 LAB — BASIC METABOLIC PANEL
ANION GAP: 4 — AB (ref 5–15)
BUN: 16 mg/dL (ref 6–20)
CO2: 28 mmol/L (ref 22–32)
Calcium: 9.1 mg/dL (ref 8.9–10.3)
Chloride: 110 mmol/L (ref 101–111)
Creatinine, Ser: 1.48 mg/dL — ABNORMAL HIGH (ref 0.61–1.24)
GFR calc Af Amer: 49 mL/min — ABNORMAL LOW (ref >60)
GFR calc non Af Amer: 42 mL/min — ABNORMAL LOW (ref >60)
GLUCOSE: 107 mg/dL — AB (ref 65–99)
POTASSIUM: 4.3 mmol/L (ref 3.5–5.1)
Sodium: 142 mmol/L (ref 135–145)

## 2015-06-22 LAB — VITAMIN B12: Vitamin B-12: 166 pg/mL — ABNORMAL LOW (ref 180–914)

## 2015-06-22 LAB — FERRITIN: Ferritin: 30 ng/mL (ref 24–336)

## 2015-06-22 MED ORDER — CLOPIDOGREL BISULFATE 75 MG PO TABS
75.0000 mg | ORAL_TABLET | Freq: Every day | ORAL | Status: DC
  Administered 2015-06-22 – 2015-06-23 (×2): 75 mg via ORAL
  Filled 2015-06-22 (×2): qty 1

## 2015-06-22 NOTE — Plan of Care (Signed)
Problem: Discharge/Transitional Outcomes Goal: Other Discharge Outcomes/Goals Plan of care progress to goal: - Complained of pain, tylenol given with improvement. - Ambulates in room with assist. - NIH score= 0, Neuro checks WNL. Will continue to monitor.

## 2015-06-22 NOTE — Consult Note (Signed)
Reason for Consult:  stroke Referring Physician:  Dr. Fernande Boyden Mcclain is an 81 y.o. male.  HPI: seen at request of Dr. Volanda Napoleon for stroke;  81 yo RHD M presents to [facility] secondary to difficulty speaking and R arm weakness.  This episode lasted about ten minutes total then resolved.  Pt denies headache or vision changes.  This has never happened before.  Pt states that he recently stopped taking a lot of his medications.  Past Medical History  Diagnosis Date  . Blood in stool     colonoscopy done ~2010  . GERD (gastroesophageal reflux disease)   . Hypertension   . Hyperlipidemia   . Cancer     basal cell CA per derm  . Kidney stones   . Barrett's esophagus     Past Surgical History  Procedure Laterality Date  . Shoulder surgery      per Dr. Theda Sers, R shoulder  . Prostate biopsy      neg x2 per patient    Family History  Problem Relation Age of Onset  . Dementia Mother   . Alcohol abuse Father   . Cancer Sister     possible breast cancer, pt was unsure  . Heart disease Brother   . Colon cancer Neg Hx   . Prostate cancer Neg Hx     Social History:  reports that he quit smoking about 64 years ago. He has never used smokeless tobacco. He reports that he drinks about 1.2 oz of alcohol per week. He reports that he does not use illicit drugs.  Allergies:  Allergies  Allergen Reactions  . Lipitor [Atorvastatin] Other (See Comments)    Reaction:  Unknown     Medications: personally reviewed by me as per chart  Results for orders placed or performed during the hospital encounter of 06/21/15 (from the past 48 hour(s))  Comprehensive metabolic panel     Status: Abnormal   Collection Time: 06/21/15  5:30 PM  Result Value Ref Range   Sodium 140 135 - 145 mmol/L   Potassium 4.1 3.5 - 5.1 mmol/L   Chloride 111 101 - 111 mmol/L   CO2 22 22 - 32 mmol/L   Glucose, Bld 112 (H) 65 - 99 mg/dL   BUN 18 6 - 20 mg/dL   Creatinine, Ser 1.47 (H) 0.61 - 1.24 mg/dL   Calcium 9.4 8.9 -  10.3 mg/dL   Total Protein 7.6 6.5 - 8.1 g/dL   Albumin 3.9 3.5 - 5.0 g/dL   AST 30 15 - 41 U/L   ALT 22 17 - 63 U/L   Alkaline Phosphatase 87 38 - 126 U/L   Total Bilirubin 0.6 0.3 - 1.2 mg/dL   GFR calc non Af Amer 43 (L) >60 mL/min   GFR calc Af Amer 49 (L) >60 mL/min    Comment: (NOTE) The eGFR has been calculated using the CKD EPI equation. This calculation has not been validated in all clinical situations. eGFR's persistently <60 mL/min signify possible Chronic Kidney Disease.    Anion gap 7 5 - 15  Troponin I     Status: None   Collection Time: 06/21/15  5:30 PM  Result Value Ref Range   Troponin I <0.03 <0.031 ng/mL    Comment:        NO INDICATION OF MYOCARDIAL INJURY.   CBC with Differential     Status: None   Collection Time: 06/21/15  5:30 PM  Result Value Ref Range   WBC  6.2 3.8 - 10.6 K/uL   RBC 4.99 4.40 - 5.90 MIL/uL   Hemoglobin 15.1 13.0 - 18.0 g/dL   HCT 44.2 40.0 - 52.0 %   MCV 88.6 80.0 - 100.0 fL   MCH 30.3 26.0 - 34.0 pg   MCHC 34.2 32.0 - 36.0 g/dL   RDW 14.1 11.5 - 14.5 %   Platelets 170 150 - 440 K/uL   Neutrophils Relative % 55 %   Neutro Abs 3.5 1.4 - 6.5 K/uL   Lymphocytes Relative 29 %   Lymphs Abs 1.8 1.0 - 3.6 K/uL   Monocytes Relative 8 %   Monocytes Absolute 0.5 0.2 - 1.0 K/uL   Eosinophils Relative 6 %   Eosinophils Absolute 0.4 0 - 0.7 K/uL   Basophils Relative 2 %   Basophils Absolute 0.1 0 - 0.1 K/uL  Basic metabolic panel     Status: Abnormal   Collection Time: 06/22/15  3:56 AM  Result Value Ref Range   Sodium 142 135 - 145 mmol/L   Potassium 4.3 3.5 - 5.1 mmol/L   Chloride 110 101 - 111 mmol/L   CO2 28 22 - 32 mmol/L   Glucose, Bld 107 (H) 65 - 99 mg/dL   BUN 16 6 - 20 mg/dL   Creatinine, Ser 1.48 (H) 0.61 - 1.24 mg/dL   Calcium 9.1 8.9 - 10.3 mg/dL   GFR calc non Af Amer 42 (L) >60 mL/min   GFR calc Af Amer 49 (L) >60 mL/min    Comment: (NOTE) The eGFR has been calculated using the CKD EPI equation. This  calculation has not been validated in all clinical situations. eGFR's persistently <60 mL/min signify possible Chronic Kidney Disease.    Anion gap 4 (L) 5 - 15  CBC     Status: None   Collection Time: 06/22/15  3:56 AM  Result Value Ref Range   WBC 6.1 3.8 - 10.6 K/uL   RBC 4.68 4.40 - 5.90 MIL/uL   Hemoglobin 14.5 13.0 - 18.0 g/dL   HCT 41.6 40.0 - 52.0 %   MCV 88.9 80.0 - 100.0 fL   MCH 31.0 26.0 - 34.0 pg   MCHC 34.9 32.0 - 36.0 g/dL   RDW 13.7 11.5 - 14.5 %   Platelets 161 150 - 440 K/uL  Ferritin     Status: None   Collection Time: 06/22/15  2:35 PM  Result Value Ref Range   Ferritin 30 24 - 336 ng/mL    Ct Head Wo Contrast  06/21/2015   CLINICAL DATA:  Episode of right hand weakness and difficulty speaking today.  EXAM: CT HEAD WITHOUT CONTRAST  TECHNIQUE: Contiguous axial images were obtained from the base of the skull through the vertex without intravenous contrast.  COMPARISON:  09/26/2010  FINDINGS: The ventricles and cisterns are within normal. There is mild prominence of the CSF spaces unchanged likely age related atrophy. There is moderate chronic ischemic microvascular disease. No evidence of mass, mass effect, shift of midline structures or acute hemorrhage. No evidence to suggest acute infarction. Remaining bones and soft tissues are within normal.  IMPRESSION: No acute intracranial findings.  Chronic ischemic microvascular disease and mild age related atrophic change.   Electronically Signed   By: Marin Olp M.D.   On: 06/21/2015 18:14   US Carotid Bilateral  06/22/2015   CLINICAL DATA:  Stroke.  Hypertension, hyperlipidemia.  EXAM: BILATERAL CAROTID DUPLEX ULTRASOUND  TECHNIQUE: Pearline Cables scale imaging, color Doppler and duplex ultrasound was performed of  bilateral carotid and vertebral arteries in the neck.  COMPARISON:  None.  REVIEW OF SYSTEMS: Quantification of carotid stenosis is based on velocity parameters that correlate the residual internal carotid diameter with  NASCET-based stenosis levels, using the diameter of the distal internal carotid lumen as the denominator for stenosis measurement.  The following velocity measurements were obtained:  PEAK SYSTOLIC/END DIASTOLIC  RIGHT  ICA:                     63/14cm/sec  CCA:                     78/67EH/MCN  SYSTOLIC ICA/CCA RATIO:  4.70  DIASTOLIC ICA/CCA RATIO:  .8  ECA:                     81cm/sec  LEFT  ICA:                     57/12cm/sec  CCA:                     96/28ZM/OQH  SYSTOLIC ICA/CCA RATIO:  4.76  DIASTOLIC ICA/CCA RATIO:  .7  ECA:                     76cm/sec  FINDINGS: RIGHT CAROTID ARTERY: Mild eccentric plaque in the proximal ICA. No significant stenosis. Normal waveforms and color Doppler signal.  RIGHT VERTEBRAL ARTERY:  Normal flow direction and waveform.  LEFT CAROTID ARTERY: Eccentric plaque in the carotid bulb. No significant stenosis. Normal waveforms and color Doppler signal.  LEFT VERTEBRAL ARTERY: Normal flow direction and waveform.  IMPRESSION: 1. Mild bilateral carotid bifurcation plaque resulting in less than 50% diameter stenosis. The exam does not exclude plaque ulceration or embolization. Continued surveillance recommended.   Electronically Signed   By: Lucrezia Europe M.D.   On: 06/22/2015 09:01    Review of Systems  Constitutional: Negative.   HENT: Negative.   Eyes: Negative.   Respiratory: Negative.   Cardiovascular: Negative.   Gastrointestinal: Negative.   Genitourinary: Negative.   Musculoskeletal: Negative.   Skin: Negative.   Neurological: Positive for speech change. Negative for dizziness, tingling, tremors, sensory change, focal weakness, seizures and loss of consciousness.  Psychiatric/Behavioral: Negative.    Blood pressure 118/55, pulse 55, temperature 98.2 F (36.8 C), temperature source Oral, resp. rate 18, height 5' 7" (1.702 m), weight 85.231 kg (187 lb 14.4 oz), SpO2 96 %. Physical Exam  Nursing note and vitals reviewed. Constitutional: He is oriented to  person, place, and time. He appears well-developed and well-nourished. No distress.  HENT:  Head: Normocephalic and atraumatic.  Right Ear: External ear normal.  Left Ear: External ear normal.  Nose: Nose normal.  Mouth/Throat: Oropharynx is clear and moist.  Eyes: Conjunctivae and EOM are normal. Pupils are equal, round, and reactive to light. No scleral icterus.  Neck: Normal range of motion. Neck supple.  Cardiovascular: Normal rate, normal heart sounds and intact distal pulses.   No murmur heard. Respiratory: Effort normal and breath sounds normal. No respiratory distress.  GI: Soft. Bowel sounds are normal. He exhibits no distension.  Musculoskeletal: Normal range of motion. He exhibits no edema.  Neurological: He is alert and oriented to person, place, and time. He has normal reflexes. He displays normal reflexes. No cranial nerve deficit. He exhibits normal muscle tone. Coordination normal.  Skin: Skin is warm and dry. He is not diaphoretic.  Psychiatric: He has a normal mood and affect.   CT of head personally reviewed by me and shows severe white matter changes and some atrophy  Assessment/Plan: 1.  Transient ischemic attack-  This is likely cardioembolic in nature due to multiple old watershed infarcts on CT.  Asymptomatic now 2.  Severe white matter changes-  Asymptomatic now -  Add plavix 9m daily -  Start Crestor 286mqHS now and then adjust for LDL < 70 -  Continue ASA 8189maily x 3 months then stop -  Needs better BP control with goal < 130/80 as outpatient -  MRI pending but will not change management -  Will need loop recorder for 90 days to look for atrial fibrillation -  Ok to d/c today, will sign off, please call with questions -  [facility]  [id number]/2016, 3:37 PM

## 2015-06-22 NOTE — Plan of Care (Signed)
Problem: Discharge/Transitional Outcomes Goal: Other Discharge Outcomes/Goals Outcome: Progressing Plan of care progress to goal: Pt scoring 0 on NIHSS. No c/o pain. Tolerating diet well. Independent with mobility and needs. U/S & echo done, waiting on MRI. Neuro consult ordered.

## 2015-06-22 NOTE — Progress Notes (Signed)
[facility] [place] NAME: Justin Ford    MR#:  [id number]  DATE OF BIRTH:  12-Apr-1930  SUBJECTIVE:  CHIEF COMPLAINT:   Chief Complaint  Patient presents with  . Transient Ischemic Attack   Asymptomatic. No complaints.  REVIEW OF SYSTEMS:   Review of Systems  Constitutional: Negative for fever.  Respiratory: Negative for shortness of breath.   Cardiovascular: Negative for chest pain, palpitations and leg swelling.  Gastrointestinal: Negative for nausea, vomiting and abdominal pain.  Genitourinary: Negative for dysuria.  Neurological: Positive for speech change and focal weakness. Negative for dizziness, tingling, tremors, sensory change, seizures, loss of consciousness and headaches.  Psychiatric/Behavioral: Negative for memory loss.    DRUG ALLERGIES:   Allergies  Allergen Reactions  . Lipitor [Atorvastatin] Other (See Comments)    Reaction:  Unknown     VITALS:  Blood pressure 140/85, pulse 64, temperature 97.8 F (36.6 C), temperature source Oral, resp. rate 20, height 5\' 7"  (1.702 m), weight 85.231 kg (187 lb 14.4 oz), SpO2 97 %.  PHYSICAL EXAMINATION:  GENERAL:  81 y.o.-year-old patient lying in the bed with no acute distress.  EYES: Pupils equal, round, reactive to light and accommodation. No scleral icterus. Extraocular muscles intact.  HEENT: Head atraumatic, normocephalic. Oropharynx and nasopharynx clear.  NECK:  Supple, no jugular venous distention. No thyroid enlargement, no tenderness.  LUNGS: Normal breath sounds bilaterally, no wheezing, rales,rhonchi or crepitation. No use of accessory muscles of respiration.  CARDIOVASCULAR: S1, S2 normal. No murmurs, rubs, or gallops.  ABDOMEN: Soft, nontender, nondistended. Bowel sounds present. No organomegaly or mass.  EXTREMITIES: No pedal edema, cyanosis, or clubbing.  NEUROLOGIC: Cranial nerves II through XII are intact. Muscle strength 5/5 in all extremities.  Sensation intact.  PSYCHIATRIC: The patient is alert and oriented x 3.  SKIN: No obvious rash, lesion, or ulcer.    LABORATORY PANEL:   CBC  Recent Labs Lab 06/22/15 0356  WBC 6.1  HGB 14.5  HCT 41.6  PLT 161   ------------------------------------------------------------------------------------------------------------------  Chemistries   Recent Labs Lab 06/21/15 1730 06/22/15 0356  NA 140 142  K 4.1 4.3  CL 111 110  CO2 22 28  GLUCOSE 112* 107*  BUN 18 16  CREATININE 1.47* 1.48*  CALCIUM 9.4 9.1  AST 30  --   ALT 22  --   ALKPHOS 87  --   BILITOT 0.6  --    ------------------------------------------------------------------------------------------------------------------  Cardiac Enzymes  Recent Labs Lab 06/21/15 1730  TROPONINI <0.03   ------------------------------------------------------------------------------------------------------------------  RADIOLOGY:  Ct Head Wo Contrast  06/21/2015   CLINICAL DATA:  Episode of right hand weakness and difficulty speaking today.  EXAM: CT HEAD WITHOUT CONTRAST  TECHNIQUE: Contiguous axial images were obtained from the base of the skull through the vertex without intravenous contrast.  COMPARISON:  09/26/2010  FINDINGS: The ventricles and cisterns are within normal. There is mild prominence of the CSF spaces unchanged likely age related atrophy. There is moderate chronic ischemic microvascular disease. No evidence of mass, mass effect, shift of midline structures or acute hemorrhage. No evidence to suggest acute infarction. Remaining bones and soft tissues are within normal.  IMPRESSION: No acute intracranial findings.  Chronic ischemic microvascular disease and mild age related atrophic change.   Electronically Signed   By: Marin Olp M.D.   On: 06/21/2015 18:14   US Carotid Bilateral  06/22/2015   CLINICAL DATA:  Stroke.  Hypertension, hyperlipidemia.  EXAM: BILATERAL CAROTID DUPLEX  ULTRASOUND  TECHNIQUE: Pearline Cables scale  imaging, color Doppler and duplex ultrasound was performed of bilateral carotid and vertebral arteries in the neck.  COMPARISON:  None.  REVIEW OF SYSTEMS: Quantification of carotid stenosis is based on velocity parameters that correlate the residual internal carotid diameter with NASCET-based stenosis levels, using the diameter of the distal internal carotid lumen as the denominator for stenosis measurement.  The following velocity measurements were obtained:  PEAK SYSTOLIC/END DIASTOLIC  RIGHT  ICA:                     63/14cm/sec  CCA:                     [id number]  SYSTOLIC ICA/CCA RATIO:  [id number]  DIASTOLIC ICA/CCA RATIO:  .8  ECA:                     81cm/sec  LEFT  ICA:                     57/12cm/sec  CCA:                     0000000  SYSTOLIC ICA/CCA RATIO:  99991111  DIASTOLIC ICA/CCA RATIO:  .7  ECA:                     76cm/sec  FINDINGS: RIGHT CAROTID ARTERY: Mild eccentric plaque in the proximal ICA. No significant stenosis. Normal waveforms and color Doppler signal.  RIGHT VERTEBRAL ARTERY:  Normal flow direction and waveform.  LEFT CAROTID ARTERY: Eccentric plaque in the carotid bulb. No significant stenosis. Normal waveforms and color Doppler signal.  LEFT VERTEBRAL ARTERY: Normal flow direction and waveform.  IMPRESSION: 1. Mild bilateral carotid bifurcation plaque resulting in less than 50% diameter stenosis. The exam does not exclude plaque ulceration or embolization. Continued surveillance recommended.   Electronically Signed   By: Lucrezia Europe M.D.   On: 06/22/2015 09:01    EKG:   Orders placed or performed during the hospital encounter of 06/21/15  . ED EKG  . ED EKG    ASSESSMENT AND PLAN:   1) TIA/CVA - CT scan negative for acute infarct, does show microvascular disease and age-related changes - Carotid Dopplers negative for significant stenosis - Echo with EF 0000000, grade 1 diastolic dysfunction - MRI is pending - Elevated LDL at 137, A1c is normal at 5.9 - Symptoms of right  arm weakness and a aphasia concerning for TIA/CVA - Does have chronic bilateral lower extremity pain will check B12, ferritin - Neurology consultation is pending - continue with asa 81 daily and start statin  CODE STATUS: full  TOTAL TIME TAKING CARE OF THIS PATIENT: 35 minutes.  Greater than 50% of time spent in care coordination and counseling. Discussed plan of care with patient and nursing. POSSIBLE D/C later today or tomorrow  Myrtis Ser M.D on 06/22/2015 at 2:14 PM  Between 7am to 6pm - Pager - [phone]  After 6pm go to [email or web address] - password [email or web address] [facility]  Office  [phone]  CC: Primary care physician; Elsie Stain, MD

## 2015-06-22 NOTE — Progress Notes (Signed)
*  PRELIMINARY RESULTS* Echocardiogram 2D Echocardiogram has been performed.  Justin Ford 06/22/2015, 8:19 AM

## 2015-06-23 DIAGNOSIS — R4701 Aphasia: Secondary | ICD-10-CM | POA: Diagnosis not present

## 2015-06-23 DIAGNOSIS — I10 Essential (primary) hypertension: Secondary | ICD-10-CM | POA: Diagnosis not present

## 2015-06-23 DIAGNOSIS — K219 Gastro-esophageal reflux disease without esophagitis: Secondary | ICD-10-CM | POA: Diagnosis not present

## 2015-06-23 DIAGNOSIS — G459 Transient cerebral ischemic attack, unspecified: Secondary | ICD-10-CM | POA: Diagnosis not present

## 2015-06-23 MED ORDER — LISINOPRIL 10 MG PO TABS: 10.0000 mg | ORAL_TABLET | Freq: Every day | ORAL | Status: DC

## 2015-06-23 MED ORDER — ROSUVASTATIN CALCIUM 20 MG PO TABS: 20.0000 mg | ORAL_TABLET | Freq: Every day | ORAL | Status: DC

## 2015-06-23 MED ORDER — ASPIRIN 81 MG PO TBEC: 81.0000 mg | DELAYED_RELEASE_TABLET | Freq: Every day | ORAL | Status: DC

## 2015-06-23 MED ORDER — CLOPIDOGREL BISULFATE 75 MG PO TABS: 75.0000 mg | ORAL_TABLET | Freq: Every day | ORAL | Status: DC

## 2015-06-23 NOTE — Plan of Care (Signed)
Problem: Discharge/Transitional Outcomes Goal: Family and patient agree upon discharge plan Outcome: Progressing Plan of Care Progress to Goal:   Pt report pain once during shift and tylenol was effective in controlling pain. Pt has no observable neuro deficits. No signs of distress noted. Will continue to monitor.

## 2015-06-23 NOTE — Discharge Instructions (Signed)
Please call Dr Damita Dunnings in the morning to make a follow up appointment for this week. He will need to order the event monitor. You will need to follow up with [facility] in 3 months.   DIET:  Low fat, Low cholesterol diet  DISCHARGE CONDITION:  Stable  ACTIVITY:  Activity as tolerated  OXYGEN:  Home Oxygen: No.   Oxygen Delivery: room air  DISCHARGE LOCATION:  home   If you experience worsening of your admission symptoms, develop shortness of breath, life threatening emergency, suicidal or homicidal thoughts you must seek medical attention immediately by calling 911 or calling your MD immediately  if symptoms less severe.  You Must read complete instructions/literature along with all the possible adverse reactions/side effects for all the Medicines you take and that have been prescribed to you. Take any new Medicines after you have completely understood and accpet all the possible adverse reactions/side effects.   Please note  You were cared for by a hospitalist during your hospital stay. If you have any questions about your discharge medications or the care you received while you were in the hospital after you are discharged, you can call the unit and asked to speak with the hospitalist on call if the hospitalist that took care of you is not available. Once you are discharged, your primary care physician will handle any further medical issues. Please note that NO REFILLS for any discharge medications will be authorized once you are discharged, as it is imperative that you return to your primary care physician (or establish a relationship with a primary care physician if you do not have one) for your aftercare needs so that they can reassess your need for medications and monitor your lab values.

## 2015-06-23 NOTE — Plan of Care (Signed)
Pt d/ced home.  A&O and ambulates independently. No c/o pain.  No R sided weakness or dysarthria demonstrated. NIHSS 0.  Charge nurse did d/c teaching and reviewed f/u appts which we weren't able to make since it's Sunday.  She also removed IV.  Pt will go home with family.

## 2015-06-23 NOTE — Progress Notes (Signed)
MD order received in [facility] to discharge pt home today; verbally reviewed AVS with pt, gave Rxs to pt; pt verbalized understanding with no further questions at this time; pt's discharge pending arrival of his ride home

## 2015-06-24 ENCOUNTER — Telehealth: Payer: Self-pay | Admitting: *Deleted

## 2015-06-24 NOTE — Discharge Summary (Signed)
Justin Ford   PATIENT NAME: Justin Ford    MR#:  [id number]  DATE OF BIRTH:  [birth year]  DATE OF ADMISSION:  06/21/2015 ADMITTING PHYSICIAN: Justin Leber, MD  DATE OF DISCHARGE: 06/23/2015  PRIMARY CARE PHYSICIAN: Justin Stain, MD    ADMISSION DIAGNOSIS:  Transient cerebral ischemia, unspecified transient cerebral ischemia type [G45.9]  DISCHARGE DIAGNOSIS:  Active Problems:   TIA (transient ischemic attack)   SECONDARY DIAGNOSIS:   Past Medical History  Diagnosis Date  . Blood in stool     colonoscopy done ~2010  . GERD (gastroesophageal reflux disease)   . Hypertension   . Hyperlipidemia   . Cancer     basal cell CA per derm  . Kidney stones   . Barrett's esophagus     HOSPITAL COURSE:    1) TIA/CVA: Symptoms highly suggestive of TIA. CT scan negative for acute infarct, does show microvascular disease and age-related changes. MRI with no acute or subacute infarcts. Does show extensive microvascular disease. Carotid Dopplers negative for significant stenosis. Echo with EF 0000000, grade 1 diastolic dysfunction. Elevated LDL at 137, Crestor started, LDL goal is less than 70. A1c is normal at 5.9. Started on daily aspirin as well as Plavix due to concerning nature of his symptoms. Aspirin should be continued for 3 months and then stopped. He will need to continue on Plavix. He was followed by neurology during the hospitalization and will need to follow-up with neurology within 3 months. He will also need a loop recorder for 90 days to look for atrial fibrillation. His primary care provider will need to coordinate this.  #2 hypertension: Started on lisinopril with goal blood pressure less than 130/80 as an outpatient.   DISCHARGE CONDITIONS:   Stable  CONSULTS OBTAINED:    Neurology Dr. Valora Ford  DRUG ALLERGIES:   Allergies  Allergen Reactions  . Lipitor [Atorvastatin] Other (See  Comments)    Reaction:  Unknown     DISCHARGE MEDICATIONS:   Discharge Medication List as of 06/23/2015  9:13 AM    START taking these medications   Details  aspirin EC 81 MG EC tablet Take 1 tablet (81 mg total) by mouth daily., Starting 06/23/2015, Until Discontinued, Print    rosuvastatin (CRESTOR) 20 MG tablet Take 1 tablet (20 mg total) by mouth daily., Starting 06/23/2015, Until Discontinued, Print    clopidogrel (PLAVIX) 75 MG tablet Take 1 tablet (75 mg total) by mouth daily., Starting 06/23/2015, Until Discontinued, Normal    lisinopril (PRINIVIL,ZESTRIL) 10 MG tablet Take 1 tablet (10 mg total) by mouth daily., Starting 06/23/2015, Until Discontinued, Normal      CONTINUE these medications which have NOT CHANGED   Details  acetaminophen (TYLENOL) 500 MG tablet Take 1,000 mg by mouth every 6 (six) hours as needed for mild pain., Until Discontinued, Historical Med    esomeprazole (NEXIUM) 40 MG capsule Take 40 mg by mouth daily. , Until Discontinued, Historical Med         DISCHARGE INSTRUCTIONS:    Discharge in stable condition. No home health needs. Heart healthy low-cholesterol diet  If you experience worsening of your admission symptoms, develop shortness of breath, life threatening emergency, suicidal or homicidal thoughts you must seek medical attention immediately by calling 911 or calling your MD immediately  if symptoms less severe.  You Must read complete instructions/literature along with all the possible adverse reactions/side effects for all the Medicines you take and  that have been prescribed to you. Take any new Medicines after you have completely understood and accept all the possible adverse reactions/side effects.   Please note  You were cared for by a hospitalist during your hospital stay. If you have any questions about your discharge medications or the care you received while you were in the hospital after you are discharged, you can call the unit and  asked to speak with the hospitalist on call if the hospitalist that took care of you is not available. Once you are discharged, your primary care physician will handle any further medical issues. Please note that NO REFILLS for any discharge medications will be authorized once you are discharged, as it is imperative that you return to your primary care physician (or establish a relationship with a primary care physician if you do not have one) for your aftercare needs so that they can reassess your need for medications and monitor your lab values.    Today   CHIEF COMPLAINT:   Chief Complaint  Patient presents with  . Transient Ischemic Attack    HISTORY OF PRESENT ILLNESS:   Justin Ford is a 81 y.o. male with a known history of hypertension, hyperlipidemia, GERD, skin cancer, nephrolithiasis, who presents to the hospital due to right hand weakness and aphasia. Patient apparently this afternoon was going to pick up his granddaughter from school and on the driver's door and noticed that his right hand and arm were little weak. He then shortly thereafter developed aphasia where he couldn't get the words out properly. His granddaughter noticed he was not acting himself and asked him to pull over. She then called EMS and he was brought to the ER for further evaluation. By that time the patient arrived to the ER his symptoms have completely resolved and he is clinically asymptomatic presently. He denied any headache, nausea, vomiting, chest pain, numbness tingling or any other associated symptoms. Patient denies ever having a previous CVA or MI in the past. Hospitalist services were contacted further treatment and evaluation  VITAL SIGNS:  Blood pressure 164/99, pulse 61, temperature 97.8 F (36.6 C), temperature source Oral, resp. rate 19, height 5\' 7"  (1.702 m), weight 85.231 kg (187 lb 14.4 oz), SpO2 95 %.  I/O:  No intake or output data in the 24 hours ending 06/24/15 1400  PHYSICAL  EXAMINATION:  GENERAL:  81 y.o.-year-old patient lying in the bed with no acute distress.  EYES: Pupils equal, round, reactive to light and accommodation. No scleral icterus. Extraocular muscles intact.  HEENT: Head atraumatic, normocephalic. Oropharynx and nasopharynx clear.  NECK:  Supple, no jugular venous distention. No thyroid enlargement, no tenderness.  LUNGS: Normal breath sounds bilaterally, no wheezing, rales,rhonchi or crepitation. No use of accessory muscles of respiration.  CARDIOVASCULAR: S1, S2 normal. No murmurs, rubs, or gallops.  ABDOMEN: Soft, non-tender, non-distended. Bowel sounds present. No organomegaly or mass.  EXTREMITIES: No pedal edema, cyanosis, or clubbing.  NEUROLOGIC: Cranial nerves II through XII are intact. Muscle strength 5/5 in all extremities. Sensation intact. Gait not checked.  PSYCHIATRIC: The patient is alert and oriented x 3.  SKIN: No obvious rash, lesion, or ulcer.   DATA REVIEW:   CBC  Recent Labs Lab 06/22/15 0356  WBC 6.1  HGB 14.5  HCT 41.6  PLT 161    Chemistries   Recent Labs Lab 06/21/15 1730 06/22/15 0356  NA 140 142  K 4.1 4.3  CL 111 110  CO2 22 28  GLUCOSE 112* 107*  BUN 18 16  CREATININE 1.47* 1.48*  CALCIUM 9.4 9.1  AST 30  --   ALT 22  --   ALKPHOS 87  --   BILITOT 0.6  --     Cardiac Enzymes  Recent Labs Lab 06/21/15 1730  TROPONINI <0.03    Microbiology Results  No results found for this or any previous visit.  RADIOLOGY:  Mr Herby Abraham Contrast  06/22/2015   CLINICAL DATA:  Transient ischemic attack. Speech disturbance developed acutely yesterday. Right arm weakness. Symptoms resolved.  EXAM: MRI HEAD WITHOUT CONTRAST  TECHNIQUE: Multiplanar, multiecho pulse sequences of the brain and surrounding structures were obtained without intravenous contrast.  COMPARISON:  Head CT 06/21/2015  FINDINGS: Diffusion imaging does not show any acute or subacute infarction. There are mild chronic small-vessel  changes of the pons. No focal cerebellar insult. The cerebral hemispheres show advanced chronic small vessel ischemic change throughout the deep and subcortical white matter. There are old small vessel insults within the basal ganglia and thalami. No evidence of large vessel territory infarction. No mass lesion, hemorrhage, hydrocephalus or extra-axial collection. A few of the old white matter infarctions are associated with punctate hemosiderin deposition. No pituitary mass. No inflammatory sinus disease. No skull or skullbase lesion.  IMPRESSION: No acute or subacute insult. Extensive chronic small vessel ischemic changes throughout the brain elsewhere as outlined above.   Electronically Signed   By: Nelson Chimes M.D.   On: 06/22/2015 19:09    EKG:   Orders placed or performed during the hospital encounter of 06/21/15  . ED EKG  . ED EKG  . EKG      Management plans discussed with the patient, family and they are in agreement.  CODE STATUS:  Advance Directive Documentation        Most Recent Value   Type of Advance Directive  Living will, Healthcare Power of Attorney   Pre-existing out of facility DNR order (yellow form or pink MOST form)     "MOST" Form in Place?        TOTAL TIME TAKING CARE OF THIS PATIENT: 35 minutes.  Greater than 50% of time spent in care coordination and counseling.  Myrtis Ser M.D on 06/24/2015 at 2:00 PM  Between 7am to 6pm - Pager - [phone]  After 6pm go to [email or web address] - password [email or web address] [facility]  Office  [phone]  CC: Primary care physician; Justin Stain, MD

## 2015-06-24 NOTE — Telephone Encounter (Signed)
Pt left v/m returning call and request cb. 

## 2015-06-24 NOTE — Telephone Encounter (Signed)
Transitional care call attempted.  Left message for patient to return call. 

## 2015-06-26 NOTE — Telephone Encounter (Signed)
Transition Care Management Follow-up Telephone Call   Date discharged? 06/23/15   How have you been since you were released from the hospital? Patient reports he doing well with no further episodes   Do you understand why you were in the hospital? yes   Do you understand the discharge instructions? yes   Where were you discharged to? Home   Items Reviewed:  Medications reviewed: yes  Allergies reviewed: yes  Dietary changes reviewed: no  Referrals reviewed: no   Functional Questionnaire:   Activities of Daily Living (ADLs):   He states they are independent in the following: ambulation, bathing and hygiene, feeding, continence, grooming, toileting and dressing States they require assistance with the following: None   Any transportation issues/concerns?: no   Any patient concerns? no   Confirmed importance and date/time of follow-up visits scheduled yes, 06/27/15 at 4  Provider Appointment booked with Dr. Damita Dunnings  Confirmed with patient if condition begins to worsen call PCP or go to the ER.  Patient was given the office number and encouraged to call back with question or concerns.  : yes

## 2015-06-27 ENCOUNTER — Encounter: Payer: Self-pay | Admitting: Family Medicine

## 2015-06-27 ENCOUNTER — Ambulatory Visit (INDEPENDENT_AMBULATORY_CARE_PROVIDER_SITE_OTHER): Payer: Medicare Other | Admitting: Family Medicine

## 2015-06-27 VITALS — BP 122/62 | HR 71 | Temp 97.9°F | Wt 184.0 lb

## 2015-06-27 DIAGNOSIS — Z23 Encounter for immunization: Secondary | ICD-10-CM | POA: Diagnosis not present

## 2015-06-27 DIAGNOSIS — G459 Transient cerebral ischemic attack, unspecified: Secondary | ICD-10-CM

## 2015-06-27 MED ORDER — LISINOPRIL 10 MG PO TABS: 10.0000 mg | ORAL_TABLET | Freq: Every day | ORAL | Status: DC

## 2015-06-27 MED ORDER — ROSUVASTATIN CALCIUM 20 MG PO TABS: 20.0000 mg | ORAL_TABLET | Freq: Every day | ORAL | Status: DC

## 2015-06-27 NOTE — Progress Notes (Signed)
Pre visit review using our clinic review tool, if applicable. No additional management support is needed unless otherwise documented below in the visit note.  Admitted with TIA sx, resolved quickly.  MRI w/o CVA seen.  meds updated in hospital.  Imaging and labs d/w pt.  Recently started back on ACE and statin.  Is on DAPT for 3 months, then to continue only plavix.  No new sx.  Had echo and carotid studies done as inpatient.  Needs f/u heart monitor to eval for AF.  No known AF, he has no sx of AF.  Feeling well now.  Tolerating current meds.  Will need f/u labs re: lipids.  D/w pt.  No new neuro sx.  He feels well.   PMH and SH reviewed  ROS: See HPI, otherwise noncontributory.  Meds, vitals, and allergies reviewed.   GEN: nad, alert and oriented HEENT: mucous membranes moist NECK: supple w/o LA CV: rrr.  PULM: ctab, no inc wob ABD: soft, +bs EXT: no edema SKIN: no acute rash CN 2-12 wnl B, S/S/DTR wnl x4

## 2015-06-27 NOTE — Patient Instructions (Signed)
Take 81mg  of aspirin and 75mg  of plavix a day until mid December.  At that point, stop aspirin and continue plavix daily.  Recheck fasting labs in about 1 month.  Schedule a fasting lab visit on the way out.  Rosaria Ferries will call about your referral for the neurology clinic.  They want to see you back in 3 months.   I'll check on getting the cardiac monitor set up.  Take care.  Glad to see you.

## 2015-06-28 ENCOUNTER — Encounter: Payer: Self-pay | Admitting: Family Medicine

## 2015-06-28 NOTE — Assessment & Plan Note (Signed)
Take 81mg  of aspirin and 75mg  of plavix a day until mid December.  At that point, stop aspirin and continue plavix daily.  Recheck fasting labs in about 1 month.  Refer back to neuro for 3 month f/u.  I'll check on getting cardiac monitor set up in the meantime.  All questions answered.

## 2015-07-01 ENCOUNTER — Telehealth: Payer: Self-pay | Admitting: Family Medicine

## 2015-07-01 ENCOUNTER — Other Ambulatory Visit: Payer: Self-pay | Admitting: Family Medicine

## 2015-07-01 DIAGNOSIS — G459 Transient cerebral ischemic attack, unspecified: Secondary | ICD-10-CM

## 2015-07-01 NOTE — Telephone Encounter (Signed)
Call pt.  We're working on getting cards monitor set up.  It looks like he won't need formal referral to cardiology but he'll have to go to another clinic to pick it up and get set up.   Staff here will likely be in touch soon.  Thanks.

## 2015-07-02 NOTE — Telephone Encounter (Signed)
Patient notified of this. Thanks!

## 2015-07-03 DIAGNOSIS — G451 Carotid artery syndrome (hemispheric): Secondary | ICD-10-CM | POA: Diagnosis not present

## 2015-07-07 DIAGNOSIS — G451 Carotid artery syndrome (hemispheric): Secondary | ICD-10-CM | POA: Insufficient documentation

## 2015-07-08 ENCOUNTER — Other Ambulatory Visit: Payer: Self-pay | Admitting: Family Medicine

## 2015-07-08 DIAGNOSIS — I4891 Unspecified atrial fibrillation: Secondary | ICD-10-CM

## 2015-07-08 DIAGNOSIS — G459 Transient cerebral ischemic attack, unspecified: Secondary | ICD-10-CM

## 2015-07-09 ENCOUNTER — Ambulatory Visit (INDEPENDENT_AMBULATORY_CARE_PROVIDER_SITE_OTHER): Payer: Medicare Other

## 2015-07-09 DIAGNOSIS — I4891 Unspecified atrial fibrillation: Secondary | ICD-10-CM | POA: Diagnosis not present

## 2015-07-09 DIAGNOSIS — G459 Transient cerebral ischemic attack, unspecified: Secondary | ICD-10-CM | POA: Diagnosis not present

## 2015-07-11 ENCOUNTER — Telehealth: Payer: Self-pay | Admitting: *Deleted

## 2015-07-11 NOTE — Telephone Encounter (Signed)
Patient wanted same type of monitor his wife had ,a [employer] Body Guardian (wireless) instead of the [employer] verite. He has allready contacted [employer] and said the are sending him a new monitor, which, should arrive in 2-3 days.

## 2015-07-25 ENCOUNTER — Other Ambulatory Visit (INDEPENDENT_AMBULATORY_CARE_PROVIDER_SITE_OTHER): Payer: Medicare Other

## 2015-07-25 DIAGNOSIS — G459 Transient cerebral ischemic attack, unspecified: Secondary | ICD-10-CM

## 2015-07-25 LAB — COMPREHENSIVE METABOLIC PANEL
ALBUMIN: 4 g/dL (ref 3.5–5.2)
ALK PHOS: 97 U/L (ref 39–117)
ALT: 35 U/L (ref 0–53)
AST: 32 U/L (ref 0–37)
BUN: 14 mg/dL (ref 6–23)
CHLORIDE: 108 meq/L (ref 96–112)
CO2: 30 mEq/L (ref 19–32)
CREATININE: 1.49 mg/dL (ref 0.40–1.50)
Calcium: 9.6 mg/dL (ref 8.4–10.5)
GFR: 47.87 mL/min — ABNORMAL LOW (ref >60.00)
Glucose, Bld: 109 mg/dL — ABNORMAL HIGH (ref 70–99)
Potassium: 4.3 mEq/L (ref 3.5–5.1)
SODIUM: 143 meq/L (ref 135–145)
TOTAL PROTEIN: 7.8 g/dL (ref 6.0–8.3)
Total Bilirubin: 0.6 mg/dL (ref 0.2–1.2)

## 2015-07-25 LAB — LIPID PANEL
CHOLESTEROL: 122 mg/dL (ref 0–200)
HDL: 50 mg/dL (ref >39.00)
LDL Cholesterol: 52 mg/dL (ref 0–99)
NonHDL: 72.07
Total CHOL/HDL Ratio: 2
Triglycerides: 101 mg/dL (ref 0.0–149.0)
VLDL: 20.2 mg/dL (ref 0.0–40.0)

## 2015-07-26 ENCOUNTER — Encounter: Payer: Self-pay | Admitting: *Deleted

## 2015-08-12 ENCOUNTER — Telehealth: Payer: Self-pay | Admitting: Family Medicine

## 2015-08-12 NOTE — Telephone Encounter (Signed)
Patient advised. Appointment scheduled.  

## 2015-08-12 NOTE — Telephone Encounter (Signed)
Please call pt.  He has no Afib seen on the cardiac monitor.  If not feeling well, then let me know.  He should have f/u with neuro scheduled.   I'll await the consult note.  I'd like to see him back in early 2017, sooner if needed.  Thanks.

## 2015-09-12 ENCOUNTER — Encounter: Payer: Self-pay | Admitting: Family Medicine

## 2015-09-12 ENCOUNTER — Ambulatory Visit (INDEPENDENT_AMBULATORY_CARE_PROVIDER_SITE_OTHER): Payer: Medicare Other | Admitting: Family Medicine

## 2015-09-12 VITALS — BP 126/74 | HR 62 | Temp 97.8°F | Wt 190.8 lb

## 2015-09-12 DIAGNOSIS — M25512 Pain in left shoulder: Secondary | ICD-10-CM

## 2015-09-12 DIAGNOSIS — M545 Low back pain, unspecified: Secondary | ICD-10-CM | POA: Insufficient documentation

## 2015-09-12 DIAGNOSIS — M25519 Pain in unspecified shoulder: Secondary | ICD-10-CM | POA: Insufficient documentation

## 2015-09-12 DIAGNOSIS — I639 Cerebral infarction, unspecified: Secondary | ICD-10-CM

## 2015-09-12 MED ORDER — CYCLOBENZAPRINE HCL 5 MG PO TABS: 5.0000 mg | ORAL_TABLET | Freq: Every evening | ORAL | Status: DC | PRN

## 2015-09-12 NOTE — Assessment & Plan Note (Signed)
Likely cuff irritation, use the exercises (handout d/w pt) and update me as needed.  He agrees.

## 2015-09-12 NOTE — Assessment & Plan Note (Signed)
Use heat on your back, gently stretch his back and try taking flexeril at night (sedation caution).  No need to image.  D/w pt.

## 2015-09-12 NOTE — Patient Instructions (Signed)
Use heat on your back, gently stretch your back and try taking flexeril at night (muscle relaxer, sedation caution).  L shoulder- use the exercises and update me as needed.  Tylenol as needed for pain.

## 2015-09-12 NOTE — Progress Notes (Signed)
Pre visit review using our clinic review tool, if applicable. No additional management support is needed unless otherwise documented below in the visit note.  L hip pain.  Lower back pain.  No trauma.  Has been getting worse for the last year.  Pain sleeping on L side.  No rash, no bruising.   L shoulder and lateral arm pain.  No R sided pain.  Has been getting worse for the last year.  Pain sleeping on L side. No rash, no bruising.   He had aches on lipitor.  On crestor now, had tolerated prev.    Meds, vitals, and allergies reviewed.   ROS: See HPI.  Otherwise, noncontributory.  nad ncat Neck supple L shoulder with normal ROM, no arm drop, int/ext rotation wnl but supraspinatus testing slightly weak No bruising, AC not ttp rrr ctab Lower back not ttp in midline but L lower back ttp, no rash no bruising L SLR neg Normal ROM L hip Distally with S/S wnl BLE

## 2015-09-23 ENCOUNTER — Emergency Department: Payer: Medicare Other

## 2015-09-23 ENCOUNTER — Inpatient Hospital Stay: Payer: Medicare Other

## 2015-09-23 ENCOUNTER — Encounter: Payer: Self-pay | Admitting: Emergency Medicine

## 2015-09-23 ENCOUNTER — Inpatient Hospital Stay
Admission: EM | Admit: 2015-09-23 | Discharge: 2015-09-24 | DRG: 069 | Disposition: A | Discharge status: Home / Self Care | Payer: Medicare Other | Attending: Internal Medicine | Admitting: Internal Medicine

## 2015-09-23 DIAGNOSIS — Z809 Family history of malignant neoplasm, unspecified: Secondary | ICD-10-CM

## 2015-09-23 DIAGNOSIS — G459 Transient cerebral ischemic attack, unspecified: Secondary | ICD-10-CM | POA: Diagnosis present

## 2015-09-23 DIAGNOSIS — R410 Disorientation, unspecified: Secondary | ICD-10-CM | POA: Diagnosis not present

## 2015-09-23 DIAGNOSIS — I639 Cerebral infarction, unspecified: Secondary | ICD-10-CM | POA: Insufficient documentation

## 2015-09-23 DIAGNOSIS — Z87442 Personal history of urinary calculi: Secondary | ICD-10-CM | POA: Diagnosis not present

## 2015-09-23 DIAGNOSIS — Z87891 Personal history of nicotine dependence: Secondary | ICD-10-CM

## 2015-09-23 DIAGNOSIS — E785 Hyperlipidemia, unspecified: Secondary | ICD-10-CM | POA: Diagnosis present

## 2015-09-23 DIAGNOSIS — Z79899 Other long term (current) drug therapy: Secondary | ICD-10-CM

## 2015-09-23 DIAGNOSIS — I10 Essential (primary) hypertension: Secondary | ICD-10-CM | POA: Diagnosis present

## 2015-09-23 DIAGNOSIS — R4701 Aphasia: Secondary | ICD-10-CM | POA: Diagnosis present

## 2015-09-23 DIAGNOSIS — Z820 Family history of epilepsy and other diseases of the nervous system: Secondary | ICD-10-CM

## 2015-09-23 DIAGNOSIS — Z811 Family history of alcohol abuse and dependence: Secondary | ICD-10-CM | POA: Diagnosis not present

## 2015-09-23 DIAGNOSIS — Z8673 Personal history of transient ischemic attack (TIA), and cerebral infarction without residual deficits: Secondary | ICD-10-CM

## 2015-09-23 DIAGNOSIS — Z8249 Family history of ischemic heart disease and other diseases of the circulatory system: Secondary | ICD-10-CM

## 2015-09-23 DIAGNOSIS — Z85828 Personal history of other malignant neoplasm of skin: Secondary | ICD-10-CM

## 2015-09-23 DIAGNOSIS — K2270 Barrett's esophagus without dysplasia: Secondary | ICD-10-CM | POA: Diagnosis present

## 2015-09-23 DIAGNOSIS — G9389 Other specified disorders of brain: Secondary | ICD-10-CM | POA: Diagnosis present

## 2015-09-23 DIAGNOSIS — K219 Gastro-esophageal reflux disease without esophagitis: Secondary | ICD-10-CM | POA: Diagnosis present

## 2015-09-23 DIAGNOSIS — Z9889 Other specified postprocedural states: Secondary | ICD-10-CM | POA: Diagnosis not present

## 2015-09-23 DIAGNOSIS — Z7982 Long term (current) use of aspirin: Secondary | ICD-10-CM

## 2015-09-23 DIAGNOSIS — I6350 Cerebral infarction due to unspecified occlusion or stenosis of unspecified cerebral artery: Secondary | ICD-10-CM | POA: Diagnosis not present

## 2015-09-23 DIAGNOSIS — N189 Chronic kidney disease, unspecified: Secondary | ICD-10-CM | POA: Diagnosis not present

## 2015-09-23 LAB — COMPREHENSIVE METABOLIC PANEL
ALK PHOS: 99 U/L (ref 38–126)
ALT: 61 U/L (ref 17–63)
ANION GAP: 7 (ref 5–15)
AST: 69 U/L — ABNORMAL HIGH (ref 15–41)
Albumin: 4.3 g/dL (ref 3.5–5.0)
BILIRUBIN TOTAL: 1 mg/dL (ref 0.3–1.2)
BUN: 18 mg/dL (ref 6–20)
CALCIUM: 9.7 mg/dL (ref 8.9–10.3)
CO2: 23 mmol/L (ref 22–32)
CREATININE: 1.43 mg/dL — AB (ref 0.61–1.24)
Chloride: 108 mmol/L (ref 101–111)
GFR, EST AFRICAN AMERICAN: 51 mL/min — AB (ref >60)
GFR, EST NON AFRICAN AMERICAN: 44 mL/min — AB (ref >60)
Glucose, Bld: 145 mg/dL — ABNORMAL HIGH (ref 65–99)
Potassium: 3.9 mmol/L (ref 3.5–5.1)
SODIUM: 138 mmol/L (ref 135–145)
Total Protein: 8.7 g/dL — ABNORMAL HIGH (ref 6.5–8.1)

## 2015-09-23 LAB — PROTIME-INR
INR: 1.03
PROTHROMBIN TIME: 13.7 s (ref 11.4–15.0)

## 2015-09-23 LAB — DIFFERENTIAL
Basophils Absolute: 0 10*3/uL (ref 0–0.1)
Basophils Relative: 1 %
EOS PCT: 3 %
Eosinophils Absolute: 0.2 10*3/uL (ref 0–0.7)
LYMPHS ABS: 1.6 10*3/uL (ref 1.0–3.6)
LYMPHS PCT: 26 %
MONO ABS: 0.4 10*3/uL (ref 0.2–1.0)
MONOS PCT: 6 %
Neutro Abs: 4.1 10*3/uL (ref 1.4–6.5)
Neutrophils Relative %: 64 %

## 2015-09-23 LAB — CBC
HEMATOCRIT: 46.4 % (ref 40.0–52.0)
Hemoglobin: 15.8 g/dL (ref 13.0–18.0)
MCH: 30.5 pg (ref 26.0–34.0)
MCHC: 34.1 g/dL (ref 32.0–36.0)
MCV: 89.3 fL (ref 80.0–100.0)
PLATELETS: 141 10*3/uL — AB (ref 150–440)
RBC: 5.19 MIL/uL (ref 4.40–5.90)
RDW: 14.8 % — ABNORMAL HIGH (ref 11.5–14.5)
WBC: 6.2 10*3/uL (ref 3.8–10.6)

## 2015-09-23 LAB — APTT: aPTT: 28 seconds (ref 24–36)

## 2015-09-23 LAB — TSH: TSH: 3.307 u[IU]/mL (ref 0.350–4.500)

## 2015-09-23 MED ORDER — ROSUVASTATIN CALCIUM 20 MG PO TABS
20.0000 mg | ORAL_TABLET | Freq: Every day | ORAL | Status: DC
  Administered 2015-09-23 – 2015-09-24 (×2): 20 mg via ORAL
  Filled 2015-09-23 (×2): qty 1

## 2015-09-23 MED ORDER — SODIUM CHLORIDE 0.9 % IV SOLN: 250.0000 mL | INTRAVENOUS | Status: DC | PRN

## 2015-09-23 MED ORDER — ASPIRIN EC 81 MG PO TBEC
81.0000 mg | DELAYED_RELEASE_TABLET | Freq: Every day | ORAL | Status: DC
  Administered 2015-09-23 – 2015-09-24 (×2): 81 mg via ORAL
  Filled 2015-09-23 (×2): qty 1

## 2015-09-23 MED ORDER — NITROGLYCERIN 2 % TD OINT
0.5000 [in_us] | TOPICAL_OINTMENT | Freq: Four times a day (QID) | TRANSDERMAL | Status: DC
  Administered 2015-09-23 – 2015-09-24 (×3): 0.5 [in_us] via TOPICAL
  Filled 2015-09-23 (×3): qty 1

## 2015-09-23 MED ORDER — SODIUM CHLORIDE 0.9 % IJ SOLN: 3.0000 mL | INTRAMUSCULAR | Status: DC | PRN

## 2015-09-23 MED ORDER — LISINOPRIL 10 MG PO TABS
10.0000 mg | ORAL_TABLET | Freq: Every day | ORAL | Status: DC
  Administered 2015-09-23 – 2015-09-24 (×2): 10 mg via ORAL
  Filled 2015-09-23 (×2): qty 1

## 2015-09-23 MED ORDER — ACETAMINOPHEN 325 MG PO TABS
650.0000 mg | ORAL_TABLET | Freq: Four times a day (QID) | ORAL | Status: DC | PRN
  Administered 2015-09-23 – 2015-09-24 (×2): 650 mg via ORAL
  Filled 2015-09-23 (×2): qty 2

## 2015-09-23 MED ORDER — HYDRALAZINE HCL 20 MG/ML IJ SOLN
10.0000 mg | Freq: Four times a day (QID) | INTRAMUSCULAR | Status: DC | PRN
  Administered 2015-09-23: 17:00:00 10 mg via INTRAVENOUS
  Filled 2015-09-23: qty 1

## 2015-09-23 MED ORDER — SODIUM CHLORIDE 0.9 % IJ SOLN
3.0000 mL | Freq: Two times a day (BID) | INTRAMUSCULAR | Status: DC
  Administered 2015-09-23 – 2015-09-24 (×2): 3 mL via INTRAVENOUS

## 2015-09-23 MED ORDER — SODIUM CHLORIDE 0.9 % IJ SOLN: 3.0000 mL | Freq: Two times a day (BID) | INTRAMUSCULAR | Status: DC

## 2015-09-23 MED ORDER — CLOPIDOGREL BISULFATE 75 MG PO TABS
75.0000 mg | ORAL_TABLET | Freq: Every day | ORAL | Status: DC
  Administered 2015-09-23 – 2015-09-24 (×2): 75 mg via ORAL
  Filled 2015-09-23 (×2): qty 1

## 2015-09-23 MED ORDER — ACETAMINOPHEN 500 MG PO TABS: 1000.0000 mg | ORAL_TABLET | Freq: Four times a day (QID) | ORAL | Status: DC | PRN

## 2015-09-23 MED ORDER — PANTOPRAZOLE SODIUM 40 MG PO TBEC
40.0000 mg | DELAYED_RELEASE_TABLET | Freq: Every day | ORAL | Status: DC
  Administered 2015-09-23 – 2015-09-24 (×2): 40 mg via ORAL
  Filled 2015-09-23 (×2): qty 1

## 2015-09-23 MED ORDER — ENOXAPARIN SODIUM 40 MG/0.4ML ~~LOC~~ SOLN
40.0000 mg | SUBCUTANEOUS | Status: DC
  Administered 2015-09-23: 40 mg via SUBCUTANEOUS
  Filled 2015-09-23: qty 0.4

## 2015-09-23 MED ORDER — CLONIDINE HCL 0.1 MG PO TABS
0.1000 mg | ORAL_TABLET | Freq: Four times a day (QID) | ORAL | Status: DC | PRN
  Administered 2015-09-23: 0.1 mg via ORAL
  Filled 2015-09-23: qty 1

## 2015-09-23 MED ORDER — ACETAMINOPHEN 650 MG RE SUPP: 650.0000 mg | Freq: Four times a day (QID) | RECTAL | Status: DC | PRN

## 2015-09-23 NOTE — ED Notes (Signed)
Pt transferred to MRI, will take pt to floor from MRI

## 2015-09-23 NOTE — Plan of Care (Signed)
Pt is A&O x4 but with some confusion.  Pt is having trouble remember some words when trying to communicate.  NIH-2.  Neuro checks and vitals q2hrs.  Pt's BP has been high, MD aware with new orders given.  Pt is high falls, bed alarm in use, call bell and phone within reach, pt calling for assistance when needed, pt remain free of injury this shift.  Pt had good po take.  Pt using urinal at bedside with 1 small episode of urine incontinence.  Clarise Cruz, RN

## 2015-09-23 NOTE — H&P (Signed)
[facility] NAME: Justin Ford    MR#:  [id number]  DATE OF BIRTH:  04/09/1930  DATE OF ADMISSION:  09/23/2015  PRIMARY CARE PHYSICIAN: Elsie Stain, MD   REQUESTING/REFERRING PHYSICIAN: Marcelene Butte M.D.  CHIEF COMPLAINT:   Chief Complaint  Patient presents with  . Aphasia    HISTORY OF PRESENT ILLNESS: Justin Ford  is a 81 y.o. male with a known history of  GERD, hypertension, hyperlipidemia and history of TIA who is brought to the hospital with difficulty with his speech. Patient is able to speak but unable to say what he was to say. Certainly words are not coming out. Patient also felt weak and unsteady on his feet and and fell down. Patient came to the ER and CT scan of the head is negative. PAST MEDICAL HISTORY:   Past Medical History  Diagnosis Date  . Blood in stool     colonoscopy done ~2010  . GERD (gastroesophageal reflux disease)   . Hypertension   . Hyperlipidemia   . Cancer ([place])     basal cell CA per derm  . Kidney stones   . Barrett's esophagus   . TIA (transient ischemic attack)     PAST SURGICAL HISTORY:  Past Surgical History  Procedure Laterality Date  . Shoulder surgery      per Dr. Theda Sers, R shoulder  . Prostate biopsy      neg x2 per patient    SOCIAL HISTORY:  Social History  Substance Use Topics  . Smoking status: Former Smoker -- 0.25 packs/day for 4 years    Quit date: 10/05/1950  . Smokeless tobacco: Never Used  . Alcohol Use: 1.2 oz/week    2 Cans of beer per week     Comment: occ    FAMILY HISTORY:  Family History  Problem Relation Age of Onset  . Dementia Mother   . Alcohol abuse Father   . Cancer Sister     possible breast cancer, pt was unsure  . Heart disease Brother   . Colon cancer Neg Hx   . Prostate cancer Neg Hx     DRUG ALLERGIES:  Allergies  Allergen Reactions  . Lipitor [Atorvastatin] Other (See Comments)    Reaction:  Unknown     REVIEW OF  SYSTEMS:   CONSTITUTIONAL: No fever, fatigue or positive weakness.  EYES: No blurred or double vision.  EARS, NOSE, AND THROAT: No tinnitus or ear pain.  RESPIRATORY: No cough, shortness of breath, wheezing or hemoptysis.  CARDIOVASCULAR: No chest pain, orthopnea, edema.  GASTROINTESTINAL: No nausea, vomiting, diarrhea or abdominal pain.  GENITOURINARY: No dysuria, hematuria.  ENDOCRINE: No polyuria, nocturia,  HEMATOLOGY: No anemia, easy bruising or bleeding SKIN: No rash or lesion. MUSCULOSKELETAL: No joint pain or arthritis.   NEUROLOGIC: No tingling, numbness, positive generalized weakness  PSYCHIATRY: No anxiety or depression.   MEDICATIONS AT HOME:  Prior to Admission medications   Medication Sig Start Date End Date Taking? Authorizing Provider  acetaminophen (TYLENOL) 500 MG tablet Take 1,000 mg by mouth every 6 (six) hours as needed for mild pain.   Yes Historical Provider, MD  aspirin EC 81 MG EC tablet Take 1 tablet (81 mg total) by mouth daily. 06/23/15  Yes Aldean Jewett, MD  clopidogrel (PLAVIX) 75 MG tablet Take 1 tablet (75 mg total) by mouth daily. 06/23/15  Yes Aldean Jewett, MD  cyclobenzaprine (FLEXERIL) 5 MG tablet Take 1 tablet (5 mg  total) by mouth at bedtime as needed for muscle spasms. 09/12/15  Yes [name], MD  esomeprazole (NEXIUM) 40 MG capsule Take 40 mg by mouth daily.    Yes Historical Provider, MD  lisinopril (PRINIVIL,ZESTRIL) 10 MG tablet Take 1 tablet (10 mg total) by mouth daily. 06/27/15  Yes [name], MD  rosuvastatin (CRESTOR) 20 MG tablet Take 1 tablet (20 mg total) by mouth daily. 06/27/15  Yes [name], MD      PHYSICAL EXAMINATION:   VITAL SIGNS: Blood pressure 172/97, pulse 59, temperature 97.9 F (36.6 C), temperature source Oral, resp. rate 19, height 5\' 8"  (1.727 m), weight 84.369 kg (186 lb), SpO2 96 %.  GENERAL:  81 y.o.-year-old patient lying in the bed with no acute distress.  EYES: Pupils equal, round,  reactive to light and accommodation. No scleral icterus. Extraocular muscles intact.  HEENT: Head atraumatic, normocephalic. Oropharynx and nasopharynx clear.  NECK:  Supple, no jugular venous distention. No thyroid enlargement, no tenderness.  LUNGS: Normal breath sounds bilaterally, no wheezing, rales,rhonchi or crepitation. No use of accessory muscles of respiration.  CARDIOVASCULAR: S1, S2 normal. No murmurs, rubs, or gallops.  ABDOMEN: Soft, nontender, nondistended. Bowel sounds present. No organomegaly or mass.  EXTREMITIES: No pedal edema, cyanosis, or clubbing.  NEUROLOGIC: Cranial nerves II through XII are intact. Muscle strength 5/5 in all extremities. Sensation intact. Gait not checked. Expressive aphasia PSYCHIATRIC: The patient is alert and oriented x 3.  SKIN: No obvious rash, lesion, or ulcer.   LABORATORY PANEL:   CBC  Recent Labs Lab 09/23/15 1229  WBC 6.2  HGB 15.8  HCT 46.4  PLT 141*  MCV 89.3  MCH 30.5  MCHC 34.1  RDW 14.8*  LYMPHSABS 1.6  MONOABS 0.4  EOSABS 0.2  BASOSABS 0.0   ------------------------------------------------------------------------------------------------------------------  Chemistries   Recent Labs Lab 09/23/15 1229  NA 138  K 3.9  CL 108  CO2 23  GLUCOSE 145*  BUN 18  CREATININE 1.43*  CALCIUM 9.7  AST 69*  ALT 61  ALKPHOS 99  BILITOT 1.0   ------------------------------------------------------------------------------------------------------------------ estimated creatinine clearance is 41.4 mL/min (by C-G formula based on Cr of 1.43). ------------------------------------------------------------------------------------------------------------------ No results for input(s): TSH, T4TOTAL, T3FREE, THYROIDAB in the last 72 hours.  Invalid input(s): FREET3   Coagulation profile  Recent Labs Lab 09/23/15 1229  INR 1.03    ------------------------------------------------------------------------------------------------------------------- No results for input(s): DDIMER in the last 72 hours. -------------------------------------------------------------------------------------------------------------------  Cardiac Enzymes No results for input(s): CKMB, TROPONINI, MYOGLOBIN in the last 168 hours.  Invalid input(s): CK ------------------------------------------------------------------------------------------------------------------ Invalid input(s): POCBNP  ---------------------------------------------------------------------------------------------------------------  Urinalysis No results found for: COLORURINE, APPEARANCEUR, LABSPEC, PHURINE, GLUCOSEU, HGBUR, BILIRUBINUR, KETONESUR, PROTEINUR, UROBILINOGEN, NITRITE, LEUKOCYTESUR   RADIOLOGY: Ct Head Wo Contrast  09/23/2015  CLINICAL DATA:  Slurred speech and confusion beginning yesterday. Some improvement today. History of recent stroke 2 months ago. EXAM: CT HEAD WITHOUT CONTRAST TECHNIQUE: Contiguous axial images were obtained from the base of the skull through the vertex without intravenous contrast. COMPARISON:  06/21/2015 as well as MRI 06/22/2015. FINDINGS: Ventricles, cisterns and other CSF spaces are within normal. There is moderate bilateral chronic ischemic microvascular disease unchanged. No evidence of mass, mass effect, shift of midline structures or acute hemorrhage. No definite acute infarction. Remaining bones and soft tissues are within normal. IMPRESSION: No acute intracranial findings. Moderate chronic ischemic microvascular disease unchanged. Electronically Signed   By: Marin Olp M.D.   On: 09/23/2015 13:05    EKG: Orders placed or performed  during the hospital encounter of 09/23/15  . EKG 12-Lead  . EKG 12-Lead  . ED EKG  . ED EKG    IMPRESSION AND PLAN: Patient is 81 year old white male presents with expressive aphasia  1.  Expressive aphasia: Due to acute CVA, obtain MRI of the brain. Due to patient having recent carotid Dopplers in September we will not repeat obtain echocardiogram of the heart. He is RD on Plavix and I will add low-dose aspirin.  2. GERD continue Nexium  3. Hypertension continue lisinopril  4. Hyperlipidemia continue Crestor check a fasting lipid panel in the a.m.  5. Miscellaneous we'll do Lovenox for DVT prophylaxis    All the records are reviewed and case discussed with ED provider. Management plans discussed with the patient, family and they are in agreement.  CODE STATUS: Full Advance Directive Documentation        Most Recent Value   Type of Advance Directive  Healthcare Power of Attorney, Living will   Pre-existing out of facility DNR order (yellow form or pink MOST form)     "MOST" Form in Place?         TOTAL TIME TAKING CARE OF THIS PATIENT: 88minutes.    Dustin Flock M.D on 09/23/2015 at 3:46 PM  Between 7am to 6pm - Pager - [phone]  After 6pm go to [email or web address] - password [email or web address] [facility]  Office  [phone]  CC: Primary care physician; Elsie Stain, MD

## 2015-09-23 NOTE — ED Notes (Signed)
MD at bedside, pt speaking in clear sentances, when pointing to a watch pt has difficulty recalling the word watch, states he is having trouble finding his words

## 2015-09-23 NOTE — Progress Notes (Signed)
   09/23/15 1633  Vitals  Temp 97.7 F (36.5 C)  Temp Source Oral  BP (!) 207/91 mmHg  MAP (mmHg) 121  BP Method Automatic  Pulse Rate (!) 58  Resp 20  Oxygen Therapy  SpO2 99 %   Spoke with Dr. Posey Pronto about pt's current BP.  MD to put in new orders.  Clarise Cruz, RN

## 2015-09-23 NOTE — ED Provider Notes (Addendum)
[facility] Emergency Department Provider Note  ____________________________________________   I have reviewed the triage vital signs and the nursing notes.   HISTORY  Chief Complaint Aphasia    HPI Justin Ford is a 81 y.o. male 81 y.o. male with a known history of hypertension, hyperlipidemia, GERD, skin cancer, nephrolithiasis  who had a TIA earlier this year presents today with TIA symptoms which started last night with gradual onset word finding difficulty. Patient states he can say the words clearly but he has trouble thinking of what they are. It appears that this is consistent with prior TIA in the past however this is been lasting since last night. He did fall one point but he states it was not because of weakness. Denies closed head injury of any significance or loss of consciousness. He denies any focal numbness or weakness to his extremities or any other neurologic complaint and denies headaches. Patient is on Plavix. The family states symptoms may have started earlier than that although they just noticed the difficulty speaking yesterday patient has had decreased energy has been more sleepy for the last 3 days.  Past Medical History  Diagnosis Date  . Blood in stool     colonoscopy done ~2010  . GERD (gastroesophageal reflux disease)   . Hypertension   . Hyperlipidemia   . Cancer ([place])     basal cell CA per derm  . Kidney stones   . Barrett's esophagus   . TIA (transient ischemic attack)     Patient Active Problem List   Diagnosis Date Noted  . Lower back pain 09/12/2015  . Left shoulder pain 09/12/2015  . TIA (transient ischemic attack) 06/21/2015  . Hyperglycemia 08/09/2013  . Trochanteric bursitis 08/09/2013  . Trigger finger 06/21/2012  . Advance directive on file 03/22/2012  . Medicare annual wellness visit, subsequent 03/15/2012  . Dupuytren contracture 03/15/2012  . Cough 12/11/2011  . Barrett esophagus 10/12/2011  . Chronic  kidney disease, stage III (moderate) 10/12/2011  . HTN (hypertension) 09/27/2011  . HLD (hyperlipidemia) 09/27/2011  . GERD (gastroesophageal reflux disease) 09/27/2011    Past Surgical History  Procedure Laterality Date  . Shoulder surgery      per Dr. Theda Sers, R shoulder  . Prostate biopsy      neg x2 per patient    Current Outpatient Rx  Name  Route  Sig  Dispense  Refill  . acetaminophen (TYLENOL) 500 MG tablet   Oral   Take 1,000 mg by mouth every 6 (six) hours as needed for mild pain.         [name] aspirin EC 81 MG EC tablet   Oral   Take 1 tablet (81 mg total) by mouth daily.   30 tablet   3   . clopidogrel (PLAVIX) 75 MG tablet   Oral   Take 1 tablet (75 mg total) by mouth daily.   30 tablet   11   . cyclobenzaprine (FLEXERIL) 5 MG tablet   Oral   Take 1 tablet (5 mg total) by mouth at bedtime as needed for muscle spasms.   30 tablet   0   . esomeprazole (NEXIUM) 40 MG capsule   Oral   Take 40 mg by mouth daily.          [name] lisinopril (PRINIVIL,ZESTRIL) 10 MG tablet   Oral   Take 1 tablet (10 mg total) by mouth daily.   90 tablet   3   . rosuvastatin (CRESTOR) 20 MG  tablet   Oral   Take 1 tablet (20 mg total) by mouth daily.   90 tablet   3     Allergies Lipitor  Family History  Problem Relation Age of Onset  . Dementia Mother   . Alcohol abuse Father   . Cancer Sister     possible breast cancer, pt was unsure  . Heart disease Brother   . Colon cancer Neg Hx   . Prostate cancer Neg Hx     Social History Social History  Substance Use Topics  . Smoking status: Former Smoker -- 0.25 packs/day for 4 years    Quit date: 10/05/1950  . Smokeless tobacco: Never Used  . Alcohol Use: 1.2 oz/week    2 Cans of beer per week     Comment: occ    Review of Systems Constitutional: No fever/chills Eyes: No visual changes. ENT: No sore throat. No stiff neck no neck pain Cardiovascular: Denies chest pain. Respiratory: Denies shortness of  breath. Gastrointestinal:   no vomiting.  No diarrhea.  No constipation. Genitourinary: Negative for dysuria. Musculoskeletal: Negative lower extremity swelling Skin: Negative for rash. Neurological: Negative for headaches, focal weakness or numbness. 10-point ROS otherwise negative.  ____________________________________________   PHYSICAL EXAM:  VITAL SIGNS: ED Triage Vitals  Enc Vitals Group     BP 09/23/15 1219 163/110 mmHg     Pulse Rate 09/23/15 1219 96     Resp 09/23/15 1219 18     Temp 09/23/15 1219 97.9 F (36.6 C)     Temp Source 09/23/15 1219 Oral     SpO2 09/23/15 1219 95 %     Weight 09/23/15 1219 186 lb (84.369 kg)     Height 09/23/15 1219 5\' 8"  (1.727 m)     Head Cir --      Peak Flow --      Pain Score --      Pain Loc --      Pain Edu? --      Excl. in [place]? --     Constitutional: Alert and oriented to name and place and date and year. Well appearing and in no acute distress. Eyes: Conjunctivae are normal. PERRL. EOMI. Head: Atraumatic. Nose: No congestion/rhinnorhea. Mouth/Throat: Mucous membranes are moist.  Oropharynx non-erythematous. Neck: No stridor.   Nontender with no meningismus Cardiovascular: Normal rate, regular rhythm. Grossly normal heart sounds.  Good peripheral circulation. Respiratory: Normal respiratory effort.  No retractions. Lungs CTAB. Abdominal: Soft and nontender. No distention. No guarding no rebound Back:  There is no focal tenderness or step off there is no midline tenderness there are no lesions noted. there is no CVA tenderness Musculoskeletal: No lower extremity tenderness. No joint effusions, no DVT signs strong distal pulses no edema Labs Cranial nerves II through XII are grossly intact 5 out of 5 strength bilateral upper and lower extremity. Finger to nose within normal limits heel to shin within normal limits, there is no dysarthria but there is expressive aphagia with patient having difficulty thinking of the words that he  wants to say including the ward watch, reflexes symmetric, pupils are equally round and reactive to light, there is no pronator drift, sensation is normal, vision is intact to confrontation, gait is deferred, there is no nystagmus,  Skin:  Skin is warm, dry and intact. No rash noted. Psychiatric: Mood and affect are normal. Speech and behavior are normal.  ____________________________________________   LABS (all labs ordered are listed, but only abnormal results are displayed)  Labs Reviewed  CBC - Abnormal; Notable for the following:    RDW 14.8 (*)    Platelets 141 (*)    All other components within normal limits  DIFFERENTIAL  PROTIME-INR  APTT  COMPREHENSIVE METABOLIC PANEL  I-STAT TROPOININ, ED  CBG MONITORING, ED  I-STAT CHEM 8, ED   ____________________________________________  EKG  I personally interpreted any EKGs ordered by me or triage Normal sinus rhythm, right bundle-branch block noted, rate 97 bpm, no acute ST elevation, ST changes consistent with bundle block. LAFB also noted. ____________________________________________  [id number]  I reviewed any imaging ordered by me or triage that were performed during my shift ____________________________________________   PROCEDURES  Procedure(s) performed: None  Critical Care performed: None  ____________________________________________   INITIAL IMPRESSION / ASSESSMENT AND PLAN / ED COURSE  Pertinent labs & imaging results that were available during my care of the patient were reviewed by me and considered in my medical decision making (see chart for details).  Patient presents today complaining of expressive aphasia since last night which persists. This is certainly consistent with a CVA. Patient has had a recent stroke workup. However, this is ongoing symptoms he may require admission for further observation and neurologic counseling. Initial blood pressure was elevated here, however he is somewhat anxious  about his symptoms. We will recheck. CT is negative for head bleed ____________________________________________   FINAL CLINICAL IMPRESSION(S) / ED DIAGNOSES  Final diagnoses:  None     Schuyler Amor, MD 09/23/15 1314  Schuyler Amor, MD 09/23/15 1314  Schuyler Amor, MD 09/23/15 [place], MD 09/23/15 [phone]

## 2015-09-23 NOTE — Progress Notes (Addendum)
Spoke with Dr. Posey Pronto about pt's current BP.  Pt has already received prn hydralazine IV with very minimal improvement.  MD to put in new orders

## 2015-09-23 NOTE — ED Notes (Addendum)
Pt presents with c/o slurred speech, confusion and having trouble getting his words out started yesterday, but some better today. Pt with hx of recent stroke.  Pt noted with some trouble getting his words out in triage. No facial droop noted.

## 2015-09-24 ENCOUNTER — Inpatient Hospital Stay (HOSPITAL_COMMUNITY)
Admit: 2015-09-24 | Discharge: 2015-09-24 | Disposition: A | Discharge status: Home / Self Care | Payer: Medicare Other | Attending: Internal Medicine | Admitting: Internal Medicine

## 2015-09-24 DIAGNOSIS — I10 Essential (primary) hypertension: Secondary | ICD-10-CM

## 2015-09-24 DIAGNOSIS — I6350 Cerebral infarction due to unspecified occlusion or stenosis of unspecified cerebral artery: Secondary | ICD-10-CM

## 2015-09-24 LAB — BASIC METABOLIC PANEL
Anion gap: 6 (ref 5–15)
BUN: 19 mg/dL (ref 6–20)
CALCIUM: 9.7 mg/dL (ref 8.9–10.3)
CO2: 28 mmol/L (ref 22–32)
CREATININE: 1.54 mg/dL — AB (ref 0.61–1.24)
Chloride: 107 mmol/L (ref 101–111)
GFR calc Af Amer: 46 mL/min — ABNORMAL LOW (ref >60)
GFR, EST NON AFRICAN AMERICAN: 40 mL/min — AB (ref >60)
GLUCOSE: 125 mg/dL — AB (ref 65–99)
Potassium: 4.2 mmol/L (ref 3.5–5.1)
Sodium: 141 mmol/L (ref 135–145)

## 2015-09-24 LAB — CBC
HCT: 44.1 % (ref 40.0–52.0)
Hemoglobin: 14.7 g/dL (ref 13.0–18.0)
MCH: 29.8 pg (ref 26.0–34.0)
MCHC: 33.3 g/dL (ref 32.0–36.0)
MCV: 89.5 fL (ref 80.0–100.0)
PLATELETS: 134 10*3/uL — AB (ref 150–440)
RBC: 4.93 MIL/uL (ref 4.40–5.90)
RDW: 15.2 % — AB (ref 11.5–14.5)
WBC: 4.9 10*3/uL (ref 3.8–10.6)

## 2015-09-24 MED ORDER — MORPHINE SULFATE (PF) 2 MG/ML IV SOLN
2.0000 mg | INTRAVENOUS | Status: DC | PRN
  Administered 2015-09-24: 2 mg via INTRAVENOUS
  Filled 2015-09-24: qty 1

## 2015-09-24 NOTE — Progress Notes (Signed)
Pt is alert and oriented x 4, denies pain, ambulating in room with stand by assist, good appetite, NIH score of 0, No evidence of acute stroke on head CT, pt is d/c to home, no new rx, d.c via daughter, reports understanding of d/c instructions at this time and has no further questions. Pt is to f/u with Dr. Damita Dunnings on 12/28, patient is aware, No new rx. Pt will continue home medications, does not need home health services.

## 2015-09-24 NOTE — Discharge Summary (Signed)
[facility] NAME: Justin Ford    MR#:  [id number]  DATE OF BIRTH:  [birth year]  DATE OF ADMISSION:  09/23/2015 ADMITTING PHYSICIAN: Dustin Flock, MD  DATE OF DISCHARGE: 09/24/2015  PRIMARY CARE PHYSICIAN: Elsie Stain, MD    ADMISSION DIAGNOSIS:  CVA (cerebral infarction) [I63.9] Expressive aphasia [R47.01] Cerebral infarction due to unspecified mechanism [I63.9]  DISCHARGE DIAGNOSIS:  Principal Problem:   TIA (transient ischemic attack)   SECONDARY DIAGNOSIS:   Past Medical History  Diagnosis Date  . Blood in stool     colonoscopy done ~2010  . GERD (gastroesophageal reflux disease)   . Hypertension   . Hyperlipidemia   . Cancer ([place])     basal cell CA per derm  . Kidney stones   . Barrett's esophagus   . TIA (transient ischemic attack)     HOSPITAL COURSE:   81 year old male with past medical history significant for hypertension, hyperlipidemia, skin cancer, Barrett's esophagus, TIA presents to the hospital secondary to speech difficulty.  #1 TIA-MRI of the brain showing chronic small vessel ischemic changes and some encephalomalacia findings. But no acute infarct or hemorrhage noted. -Patient's symptoms are completely resolved and he is back to his baseline. -Is already on aspirin and Plavix and statin. Continue taking those medications. -Appreciate physical therapy consult. Patient has no physical therapy needs as outpatient. -He will follow-up with his primary care physician.  #2 GERD/ Barrett's esophagus-continue Nexium  #3 hyperlipidemia-on Crestor  #4 hypertension-on lisinopril.  Patient will be discharged home in a safe condition.  DISCHARGE CONDITIONS:   Stable  CONSULTS OBTAINED:    none  DRUG ALLERGIES:   Allergies  Allergen Reactions  . Lipitor [Atorvastatin] Other (See Comments)    Reaction:  Unknown     DISCHARGE MEDICATIONS:   Current Discharge Medication List     CONTINUE these medications which have NOT CHANGED   Details  acetaminophen (TYLENOL) 500 MG tablet Take 1,000 mg by mouth every 6 (six) hours as needed for mild pain.    aspirin EC 81 MG EC tablet Take 1 tablet (81 mg total) by mouth daily. Qty: 30 tablet, Refills: 3    clopidogrel (PLAVIX) 75 MG tablet Take 1 tablet (75 mg total) by mouth daily. Qty: 30 tablet, Refills: 11    cyclobenzaprine (FLEXERIL) 5 MG tablet Take 1 tablet (5 mg total) by mouth at bedtime as needed for muscle spasms. Qty: 30 tablet, Refills: 0    esomeprazole (NEXIUM) 40 MG capsule Take 40 mg by mouth daily.     lisinopril (PRINIVIL,ZESTRIL) 10 MG tablet Take 1 tablet (10 mg total) by mouth daily. Qty: 90 tablet, Refills: 3    rosuvastatin (CRESTOR) 20 MG tablet Take 1 tablet (20 mg total) by mouth daily. Qty: 90 tablet, Refills: 3         DISCHARGE INSTRUCTIONS:   1. PCP follow-up in 1 week  If you experience worsening of your admission symptoms, develop shortness of breath, life threatening emergency, suicidal or homicidal thoughts you must seek medical attention immediately by calling 911 or calling your MD immediately  if symptoms less severe.  You Must read complete instructions/literature along with all the possible adverse reactions/side effects for all the Medicines you take and that have been prescribed to you. Take any new Medicines after you have completely understood and accept all the possible adverse reactions/side effects.   Please note  You were cared for by a hospitalist during your  hospital stay. If you have any questions about your discharge medications or the care you received while you were in the hospital after you are discharged, you can call the unit and asked to speak with the hospitalist on call if the hospitalist that took care of you is not available. Once you are discharged, your primary care physician will handle any further medical issues. Please note that NO REFILLS for any  discharge medications will be authorized once you are discharged, as it is imperative that you return to your primary care physician (or establish a relationship with a primary care physician if you do not have one) for your aftercare needs so that they can reassess your need for medications and monitor your lab values.    Today   CHIEF COMPLAINT:   Chief Complaint  Patient presents with  . Aphasia    VITAL SIGNS:  Blood pressure 143/87, pulse 61, temperature 97.4 F (36.3 C), temperature source Oral, resp. rate 18, height 5\' 8"  (1.727 m), weight 84.369 kg (186 lb), SpO2 95 %.  I/O:   Intake/Output Summary (Last 24 hours) at 09/24/15 1051 Last data filed at 09/24/15 0725  Gross per 24 hour  Intake    480 ml  Output    750 ml  Net   -270 ml    PHYSICAL EXAMINATION:   Physical Exam  GENERAL:  81 y.o.-year-old patient sitting in the bed with no acute distress.  EYES: Pupils equal, round, reactive to light and accommodation. No scleral icterus. Extraocular muscles intact.  HEENT: Head atraumatic, normocephalic. Oropharynx and nasopharynx clear.  NECK:  Supple, no jugular venous distention. No thyroid enlargement, no tenderness.  LUNGS: Normal breath sounds bilaterally, no wheezing, rales,rhonchi or crepitation. No use of accessory muscles of respiration.  CARDIOVASCULAR: S1, S2 normal. No  rubs, or gallops. 3/6 systolic murmur is present ABDOMEN: Soft, non-tender, non-distended. Bowel sounds present. No organomegaly or mass.  EXTREMITIES: No pedal edema, cyanosis, or clubbing.  NEUROLOGIC: Cranial nerves II through XII are intact. Muscle strength 5/5 in all extremities. Sensation intact. Gait stable.  PSYCHIATRIC: The patient is alert and oriented x 3.  SKIN: No obvious rash, lesion, or ulcer.   DATA REVIEW:   CBC  Recent Labs Lab 09/24/15 0517  WBC 4.9  HGB 14.7  HCT 44.1  PLT 134*    Chemistries   Recent Labs Lab 09/23/15 1229 09/24/15 0517  NA 138 141  K  3.9 4.2  CL 108 107  CO2 23 28  GLUCOSE 145* 125*  BUN 18 19  CREATININE 1.43* 1.54*  CALCIUM 9.7 9.7  AST 69*  --   ALT 61  --   ALKPHOS 99  --   BILITOT 1.0  --     Cardiac Enzymes No results for input(s): TROPONINI in the last 168 hours.  Microbiology Results  No results found for this or any previous visit.  RADIOLOGY:  Ct Head Wo Contrast  09/23/2015  CLINICAL DATA:  Slurred speech and confusion beginning yesterday. Some improvement today. History of recent stroke 2 months ago. EXAM: CT HEAD WITHOUT CONTRAST TECHNIQUE: Contiguous axial images were obtained from the base of the skull through the vertex without intravenous contrast. COMPARISON:  06/21/2015 as well as MRI 06/22/2015. FINDINGS: Ventricles, cisterns and other CSF spaces are within normal. There is moderate bilateral chronic ischemic microvascular disease unchanged. No evidence of mass, mass effect, shift of midline structures or acute hemorrhage. No definite acute infarction. Remaining bones and soft tissues are within  normal. IMPRESSION: No acute intracranial findings. Moderate chronic ischemic microvascular disease unchanged. Electronically Signed   By: Marin Olp M.D.   On: 09/23/2015 13:05   Mr Brain Wo Contrast  09/23/2015  CLINICAL DATA:  81 year old hypertensive male with difficulty finding words, slurred speech and confusion onset yesterday. Stroke 2 months ago. Subsequent encounter. EXAM: MRI HEAD WITHOUT CONTRAST TECHNIQUE: Multiplanar, multiecho pulse sequences of the brain and surrounding structures were obtained without intravenous contrast. COMPARISON:  09/23/2015 CT.  06/22/2015 MR. FINDINGS: No acute infarct. No intracranial hemorrhage. Significant diffuse white matter changes most notable periventricular and subcortical region consistent with result of small vessel disease and similar to the prior exam. Global atrophy without hydrocephalus. No intracranial mass lesion noted on this unenhanced exam. Major  intracranial vascular structures are patent. Minimal partial opacification inferior left mastoid air cells. Mild mucosal thickening right maxillary sinus and ethmoid sinus air cells bilaterally. C3-4 bulge with mild spinal stenosis and minimal cord contact. Facet degenerative changes greater on the right. Post lens replacement otherwise orbital structures unremarkable. Cervical medullary junction, pituitary region and pineal region unremarkable. IMPRESSION: No acute infarct. No intracranial hemorrhage. Significant diffuse white matter changes most notable periventricular and subcortical region consistent with result of small vessel disease and similar to the prior exam. Global atrophy without hydrocephalus. No intracranial mass lesion noted on this unenhanced exam. Minimal partial opacification inferior left mastoid air cells. Mild mucosal thickening right maxillary sinus and ethmoid sinus air cells bilaterally. C3-4 bulge with mild spinal stenosis. Electronically Signed   By: Genia Del M.D.   On: 09/23/2015 15:57    EKG:   Orders placed or performed during the hospital encounter of 09/23/15  . EKG 12-Lead  . EKG 12-Lead  . ED EKG  . ED EKG      Management plans discussed with the patient, family and they are in agreement.  CODE STATUS:     Code Status Orders        Start     Ordered   09/23/15 1606  Full code   Continuous     09/23/15 1605    Advance Directive Documentation        Most Recent Value   Type of Advance Directive  Healthcare Power of Attorney, Living will   Pre-existing out of facility DNR order (yellow form or pink MOST form)     "MOST" Form in Place?        TOTAL TIME TAKING CARE OF THIS PATIENT: 37 minutes.    Gladstone Lighter M.D on 09/24/2015 at 10:51 AM  Between 7am to 6pm - Pager - [phone]  After 6pm go to [email or web address] - password [email or web address] [facility]  Office  [phone]  CC: Primary care physician; Elsie Stain,  MD

## 2015-09-24 NOTE — Plan of Care (Addendum)
Problem: Education: Goal: Knowledge of [facility] Education information/materials will improve Outcome: Progressing Oriented to unit   Problem: Safety: Goal: Ability to remain free from injury will improve Outcome: Progressing Frequent rounding to keep pt safe.

## 2015-09-24 NOTE — Progress Notes (Addendum)
Pt complains of leg pain.  Requests 2 tylenol.  Explained to pt that he received 650 mg of tylenol at 2312.  Pt upset and wants another 2 tylenol.  Offered to call physician for different medication order but pt declined.  Wrote information on board ie tylenol every 6 hours.  Last dose 11:12 PM, next dose 5:12 AM. Pt also upset he wants the bed alarm turned off.  Explained to pt that he is on fall precautions and that it is a reminder to call us each time he gets up. Pt states "I don't like it". Will monitor pt frequently.  NIH is (0). Dorna Bloom RN

## 2015-09-24 NOTE — Progress Notes (Signed)
Pt sitting up in chair and took telemetry off.  Replaced telemetry. Dorna Bloom RN

## 2015-09-24 NOTE — Progress Notes (Signed)
*  PRELIMINARY RESULTS* Echocardiogram 2D Echocardiogram has been performed.  Justin Ford 09/24/2015, 9:18 AM

## 2015-09-24 NOTE — Evaluation (Addendum)
Physical Therapy Evaluation Patient Details Name: Justin Ford MRN: [id number] DOB: 1930/09/27 Today's Date: 09/24/2015   History of Present Illness  presented to ER with speech difficulty and weakness, status post fall; admitted for TIA vs. CVA work-up.  MRI negative for acute infarct at this time.  Clinical Impression  Upon evaluation, patient alert and oriented; follows all commands and demonstrates fair/good safety awareness and insight.  Generally impulsive with all mobility.  Strength and ROM grossly symmetrical and WFL for basic transfers and mobility; no focal weakness, sensory deficit or coordination deficit appreciated.  Speech clear, fluent and intelligible; no evidence of aphasia noted.  Able to complete bed mobility indep; sit/stand without assist device, mod indep; basic transfers and gait (250') without assist device, cga/close sup; stairs (up/down 6) with single rail, cga/min assist.  Occasional cuing for slow, controlled movement due to impulsivity.  Modified DGI 11/12 with gait speed at 2.50ft/second; both indicative of minimal fall risk with functional activity.   Patient feels all deficits warranting initial presentation to hospital have fully resolved and he is at/near baseline level of functional performance. Would benefit from skilled PT to address mild higher-level balance deficits and to promote continued mobility throughout hospitalization; anticipate no skilled PT needs upon discharge.  Will continue to monitor as appropriate.     Follow Up Recommendations No PT follow up (will continue to see during remaining hospitalization to promote continued mobility; anticipate no skilled PT needs upon discharge)    Equipment Recommendations       Recommendations for Other Services       Precautions / Restrictions Precautions Precautions: Fall Restrictions Weight Bearing Restrictions: No      Mobility  Bed Mobility Overal bed mobility: Independent                 Transfers Overall transfer level: Modified independent                  Ambulation/Gait Ambulation/Gait assistance: Min guard;Supervision Ambulation Distance (Feet): 250 Feet Assistive device: None   Gait velocity: 10' walk time, 5 seconds Gait velocity interpretation: at or above normal speed for age/gender General Gait Details: reciprocal stepping with good step height/length; mild foward hip/trunk flexion. Fair/good cadence and overall gait speed with good fluidity.  Mild anterior LOB x1, patient able to self-recover.  Completes dynamic gait components without LOB or safety concern.  Stairs Stairs: Yes Stairs assistance: Min assist Stair Management: One rail Right Number of Stairs: 6 General stair comments: reciprocal stepping; min cuing to ensure full foot contact with step prior to advancing  Wheelchair Mobility    Modified Rankin (Stroke Patients Only)       Balance Overall balance assessment: Needs assistance Sitting-balance support: Feet supported;No upper extremity supported Sitting balance-Leahy Scale: Normal     Standing balance support: No upper extremity supported Standing balance-Leahy Scale: Good                   Standardized Balance Assessment Standardized Balance Assessment :  (Modified DGI 11/12, indicative of minimal fall risk with mobility)           Pertinent Vitals/Pain Pain Assessment: No/denies pain    Home Living Family/patient expects to be discharged to:: Private residence Living Arrangements: Spouse/significant other Available Help at Discharge: Family Type of Home: House Home Access: Stairs to enter Entrance Stairs-Rails: Right Entrance Stairs-Number of Steps: 3 Home Layout: Two level;Bed/bath upstairs        Prior Function Level of Independence: Independent  Comments: Indep with household and community mobility; denies fall history outside of this episode     Hand Dominance   Dominant Hand:  Right    Extremity/Trunk Assessment   Upper Extremity Assessment: Overall WFL for tasks assessed           Lower Extremity Assessment: Overall WFL for tasks assessed         Communication   Communication: No difficulties (fluent, intelligible and appropriate; no expressive difficulties appreciated)  Cognition Arousal/Alertness: Awake/alert Behavior During Therapy: WFL for tasks assessed/performed;Impulsive Overall Cognitive Status: Within Functional Limits for tasks assessed                      General Comments      Exercises        Assessment/Plan    PT Assessment Patient needs continued PT services  PT Diagnosis Abnormality of gait   PT Problem List Decreased balance  PT Treatment Interventions Stair training;Functional mobility training;Therapeutic activities;Balance training;Gait training;Therapeutic exercise;Patient/family education   PT Goals (Current goals can be found in the Care Plan section) Acute Rehab PT Goals Patient Stated Goal: "to go home" PT Goal Formulation: With patient Time For Goal Achievement: 10/08/15 Potential to Achieve Goals: Good    Frequency Min 2X/week   Barriers to discharge        Co-evaluation               End of Session Equipment Utilized During Treatment: Gait belt Activity Tolerance: Patient tolerated treatment well Patient left: in bed;with call bell/phone within reach (refusing alarm at this time)           Time: [id number] PT Time Calculation (min) (ACUTE ONLY): 14 min   Charges:   PT Evaluation $Initial PT Evaluation Tier I: 1 Procedure PT Treatments $Gait Training: 8-22 mins   PT G Codes:       Danish Ruffins H. Owens Shark, PT, DPT, NCS 09/24/2015, 10:00 AM [phone]

## 2015-09-24 NOTE — Progress Notes (Signed)
Received call pt was SB with 2.4 sec pause.  Woke pt up and pt HR went up to 61.  Pt asymptomatic.  Notified Dr Marcille Blanco with no new orders.  Pt continues to be SB at 56 to NSR at 63. Dorna Bloom RN

## 2015-09-24 NOTE — Progress Notes (Signed)
Pt up in room dressed and stating that he is going to leave. Nurse Caren Griffins and I spoke with pt and asked him to wait until I spoke to the doctor.  Spoke with Dr Marcille Blanco about pt complaints of leg pain 5/10.  Ordered Morphine 2 mg IV.  Gave pt morphine and explained reason for restricting tylenol.  Pt states he is going to get up in the morning and buy his own tylenol.  Explained to pt that this is against hospital policy.  Pt is refusing bed alarm. Explained to pt that he is at risk for falls and that we cannot be responsible for him falling if he refuses the bed alarm.  Pt continues to refuse bed alarm. Pt would not allow me to help him undress or put his items away.  After a few minutes checked on pt and he is resting in bed. Will round frequently. Dorna Bloom RN

## 2015-09-26 ENCOUNTER — Telehealth: Payer: Self-pay | Admitting: *Deleted

## 2015-09-26 DIAGNOSIS — M5033 Other cervical disc degeneration, cervicothoracic region: Secondary | ICD-10-CM | POA: Diagnosis not present

## 2015-09-26 DIAGNOSIS — M5412 Radiculopathy, cervical region: Secondary | ICD-10-CM | POA: Diagnosis not present

## 2015-09-26 DIAGNOSIS — M9902 Segmental and somatic dysfunction of thoracic region: Secondary | ICD-10-CM | POA: Diagnosis not present

## 2015-09-26 DIAGNOSIS — M9901 Segmental and somatic dysfunction of cervical region: Secondary | ICD-10-CM | POA: Diagnosis not present

## 2015-09-26 NOTE — Telephone Encounter (Signed)
Transition Care Management Follow-up Telephone Call   Date discharged? 09/24/15   How have you been since you were released from the hospital? Patient reports he is doing very well.   Do you understand why you were in the hospital? yes   Do you understand the discharge instructions? yes   Where were you discharged to? Home   Items Reviewed:  Medications reviewed: yes  Allergies reviewed: yes  Dietary changes reviewed: no  Referrals reviewed: none   Functional Questionnaire:   Activities of Daily Living (ADLs):   He states they are independent in the following: ambulation, bathing and hygiene, feeding, continence, grooming, toileting and dressing States they require assistance with the following: none   Any transportation issues/concerns?: no   Any patient concerns? no   Confirmed importance and date/time of follow-up visits scheduled yes, 10/02/15 @ 1500  Provider Appointment booked with Renford Dills, MD  Confirmed with patient if condition begins to worsen call PCP or go to the ER.  Patient was given the office number and encouraged to call back with question or concerns.  : yes

## 2015-09-26 NOTE — Telephone Encounter (Signed)
Transitional care call attempted.  Left message for patient to return call. 

## 2015-09-27 DIAGNOSIS — M5033 Other cervical disc degeneration, cervicothoracic region: Secondary | ICD-10-CM | POA: Diagnosis not present

## 2015-09-27 DIAGNOSIS — M5412 Radiculopathy, cervical region: Secondary | ICD-10-CM | POA: Diagnosis not present

## 2015-09-27 DIAGNOSIS — M9902 Segmental and somatic dysfunction of thoracic region: Secondary | ICD-10-CM | POA: Diagnosis not present

## 2015-09-27 DIAGNOSIS — M9901 Segmental and somatic dysfunction of cervical region: Secondary | ICD-10-CM | POA: Diagnosis not present

## 2015-10-01 DIAGNOSIS — M9902 Segmental and somatic dysfunction of thoracic region: Secondary | ICD-10-CM | POA: Diagnosis not present

## 2015-10-01 DIAGNOSIS — M5412 Radiculopathy, cervical region: Secondary | ICD-10-CM | POA: Diagnosis not present

## 2015-10-01 DIAGNOSIS — M9901 Segmental and somatic dysfunction of cervical region: Secondary | ICD-10-CM | POA: Diagnosis not present

## 2015-10-01 DIAGNOSIS — M5033 Other cervical disc degeneration, cervicothoracic region: Secondary | ICD-10-CM | POA: Diagnosis not present

## 2015-10-02 ENCOUNTER — Ambulatory Visit (INDEPENDENT_AMBULATORY_CARE_PROVIDER_SITE_OTHER): Payer: Medicare Other | Admitting: Family Medicine

## 2015-10-02 ENCOUNTER — Encounter: Payer: Self-pay | Admitting: Family Medicine

## 2015-10-02 VITALS — BP 132/84 | HR 75 | Temp 97.5°F | Wt 188.8 lb

## 2015-10-02 DIAGNOSIS — G459 Transient cerebral ischemic attack, unspecified: Secondary | ICD-10-CM | POA: Diagnosis not present

## 2015-10-02 DIAGNOSIS — M9902 Segmental and somatic dysfunction of thoracic region: Secondary | ICD-10-CM | POA: Diagnosis not present

## 2015-10-02 DIAGNOSIS — R269 Unspecified abnormalities of gait and mobility: Secondary | ICD-10-CM | POA: Diagnosis not present

## 2015-10-02 DIAGNOSIS — M5412 Radiculopathy, cervical region: Secondary | ICD-10-CM | POA: Diagnosis not present

## 2015-10-02 DIAGNOSIS — M9901 Segmental and somatic dysfunction of cervical region: Secondary | ICD-10-CM | POA: Diagnosis not present

## 2015-10-02 DIAGNOSIS — M5033 Other cervical disc degeneration, cervicothoracic region: Secondary | ICD-10-CM | POA: Diagnosis not present

## 2015-10-02 MED ORDER — CLOPIDOGREL BISULFATE 75 MG PO TABS: 75.0000 mg | ORAL_TABLET | Freq: Every day | ORAL | Status: DC

## 2015-10-02 MED ORDER — ASPIRIN 81 MG PO TBEC: 81.0000 mg | DELAYED_RELEASE_TABLET | Freq: Every day | ORAL | Status: DC

## 2015-10-02 MED ORDER — PANTOPRAZOLE SODIUM 40 MG PO TBEC: 40.0000 mg | DELAYED_RELEASE_TABLET | Freq: Every day | ORAL | Status: DC

## 2015-10-02 NOTE — Progress Notes (Signed)
Pre visit review using our clinic review tool, if applicable. No additional management support is needed unless otherwise documented below in the visit note.  TCM visit.  D/w pt:  DATE OF ADMISSION: 12/19/2016ADMITTING PHYSICIAN: Dustin Flock, MD  DATE OF DISCHARGE: 09/24/2015  PRIMARY CARE PHYSICIAN: Elsie Stain, MD    ADMISSION DIAGNOSIS:  CVA (cerebral infarction) [I63.9] Expressive aphasia [R47.01] Cerebral infarction due to unspecified mechanism [I63.9]  DISCHARGE DIAGNOSIS:  Principal Problem:  TIA (transient ischemic attack)   SECONDARY DIAGNOSIS:   Past Medical History  Diagnosis Date  . Blood in stool     colonoscopy done ~2010  . GERD (gastroesophageal reflux disease)   . Hypertension   . Hyperlipidemia   . Cancer ([place])     basal cell CA per derm  . Kidney stones   . Barrett's esophagus   . TIA (transient ischemic attack)     HOSPITAL COURSE:   81 year old male with past medical history significant for hypertension, hyperlipidemia, skin cancer, Barrett's esophagus, TIA presents to the hospital secondary to speech difficulty.  #1 TIA-MRI of the brain showing chronic small vessel ischemic changes and some encephalomalacia findings. But no acute infarct or hemorrhage noted. -Patient's symptoms are completely resolved and he is back to his baseline. -Is already on aspirin and Plavix and statin. Continue taking those medications. -Appreciate physical therapy consult. Patient has no physical therapy needs as outpatient. -He will follow-up with his primary care physician.  #2 GERD/ Barrett's esophagus-continue Nexium  #3 hyperlipidemia-on Crestor  #4 hypertension-on lisinopril.  Patient will be discharged home in a safe condition.  DISCHARGE CONDITIONS:   Stable  CONSULTS OBTAINED:    none  DRUG ALLERGIES:   Allergies  Allergen Reactions  . Lipitor [Atorvastatin] Other (See  Comments)    Reaction: Unknown     DISCHARGE MEDICATIONS:   Current Discharge Medication List    CONTINUE these medications which have NOT CHANGED   Details  acetaminophen (TYLENOL) 500 MG tablet Take 1,000 mg by mouth every 6 (six) hours as needed for mild pain.    aspirin EC 81 MG EC tablet Take 1 tablet (81 mg total) by mouth daily. Qty: 30 tablet, Refills: 3    clopidogrel (PLAVIX) 75 MG tablet Take 1 tablet (75 mg total) by mouth daily. Qty: 30 tablet, Refills: 11    cyclobenzaprine (FLEXERIL) 5 MG tablet Take 1 tablet (5 mg total) by mouth at bedtime as needed for muscle spasms. Qty: 30 tablet, Refills: 0    esomeprazole (NEXIUM) 40 MG capsule Take 40 mg by mouth daily.          Update 10/02/15.   F/u for TIA.  No new neuro deficits.  Speech back to baseline but still with some balance troubles on standing.  No falls.  No syncope.  No true weakness.  Compliant with meds.   Inpatient imaging and labs d/w pt, along with rationale for current meds.  See plan.    We talked about driving in the meantime.    PMH and SH reviewed  ROS: See HPI, otherwise noncontributory.  Meds, vitals, and allergies reviewed.   GEN: nad, alert and oriented HEENT: mucous membranes moist NECK: supple w/o LA CV: rrr.  PULM: ctab, no inc wob ABD: soft, +bs EXT: no edema Some sway on balance testing, with compensation, doesn't fall.  Gait still symmetric.   CN2-12 wnl o/w, S/S grossly WNL x4

## 2015-10-02 NOTE — Patient Instructions (Signed)
Justin Ford will call about your referral.  See her on the way out.  Stop nexium and change to pantoprazole.   Continue plavix, add on 81mg  of aspirin a day.  I would have you talk with your family and reassess your driving in about 2 weeks.   I wouldn't drive for now.  Take tylenol if needed for aches.   Take care.  Glad to see you.  Keep the 10/2015 appointment.

## 2015-10-03 ENCOUNTER — Encounter: Payer: Self-pay | Admitting: Family Medicine

## 2015-10-03 DIAGNOSIS — M9901 Segmental and somatic dysfunction of cervical region: Secondary | ICD-10-CM | POA: Diagnosis not present

## 2015-10-03 DIAGNOSIS — M9902 Segmental and somatic dysfunction of thoracic region: Secondary | ICD-10-CM | POA: Diagnosis not present

## 2015-10-03 DIAGNOSIS — M5033 Other cervical disc degeneration, cervicothoracic region: Secondary | ICD-10-CM | POA: Diagnosis not present

## 2015-10-03 DIAGNOSIS — M5412 Radiculopathy, cervical region: Secondary | ICD-10-CM | POA: Diagnosis not present

## 2015-10-03 NOTE — Assessment & Plan Note (Signed)
At this point, would continue ASA plavix combination.  D/w pt.  Would change nexium to pantoprazole to avoid potential interaction with plavix.  Continue statin and ACE.  He has routine neuro f/u pending.  No new deficits at this point, but reasonable to refer him to PT. He agrees.  Diving d/w pt.  He doesn't have legal prohibition, ie he didn't have SZ, but it would be reasonable not to drive for a period of time and then reassess his ability/discuss with him family.  Pt and daughter agree.   See AVS.

## 2015-10-06 DIAGNOSIS — G458 Other transient cerebral ischemic attacks and related syndromes: Secondary | ICD-10-CM | POA: Diagnosis not present

## 2015-10-06 DIAGNOSIS — R262 Difficulty in walking, not elsewhere classified: Secondary | ICD-10-CM | POA: Diagnosis not present

## 2015-10-07 DIAGNOSIS — M9901 Segmental and somatic dysfunction of cervical region: Secondary | ICD-10-CM | POA: Diagnosis not present

## 2015-10-07 DIAGNOSIS — M5033 Other cervical disc degeneration, cervicothoracic region: Secondary | ICD-10-CM | POA: Diagnosis not present

## 2015-10-07 DIAGNOSIS — M5412 Radiculopathy, cervical region: Secondary | ICD-10-CM | POA: Diagnosis not present

## 2015-10-07 DIAGNOSIS — M9902 Segmental and somatic dysfunction of thoracic region: Secondary | ICD-10-CM | POA: Diagnosis not present

## 2015-10-09 DIAGNOSIS — M9901 Segmental and somatic dysfunction of cervical region: Secondary | ICD-10-CM | POA: Diagnosis not present

## 2015-10-09 DIAGNOSIS — M9902 Segmental and somatic dysfunction of thoracic region: Secondary | ICD-10-CM | POA: Diagnosis not present

## 2015-10-09 DIAGNOSIS — M5412 Radiculopathy, cervical region: Secondary | ICD-10-CM | POA: Diagnosis not present

## 2015-10-09 DIAGNOSIS — M5033 Other cervical disc degeneration, cervicothoracic region: Secondary | ICD-10-CM | POA: Diagnosis not present

## 2015-10-11 DIAGNOSIS — M9901 Segmental and somatic dysfunction of cervical region: Secondary | ICD-10-CM | POA: Diagnosis not present

## 2015-10-11 DIAGNOSIS — M9902 Segmental and somatic dysfunction of thoracic region: Secondary | ICD-10-CM | POA: Diagnosis not present

## 2015-10-11 DIAGNOSIS — M5412 Radiculopathy, cervical region: Secondary | ICD-10-CM | POA: Diagnosis not present

## 2015-10-11 DIAGNOSIS — M5033 Other cervical disc degeneration, cervicothoracic region: Secondary | ICD-10-CM | POA: Diagnosis not present

## 2015-10-16 DIAGNOSIS — M9902 Segmental and somatic dysfunction of thoracic region: Secondary | ICD-10-CM | POA: Diagnosis not present

## 2015-10-16 DIAGNOSIS — M9901 Segmental and somatic dysfunction of cervical region: Secondary | ICD-10-CM | POA: Diagnosis not present

## 2015-10-16 DIAGNOSIS — M5033 Other cervical disc degeneration, cervicothoracic region: Secondary | ICD-10-CM | POA: Diagnosis not present

## 2015-10-16 DIAGNOSIS — M5412 Radiculopathy, cervical region: Secondary | ICD-10-CM | POA: Diagnosis not present

## 2015-10-18 DIAGNOSIS — M5033 Other cervical disc degeneration, cervicothoracic region: Secondary | ICD-10-CM | POA: Diagnosis not present

## 2015-10-18 DIAGNOSIS — M9902 Segmental and somatic dysfunction of thoracic region: Secondary | ICD-10-CM | POA: Diagnosis not present

## 2015-10-18 DIAGNOSIS — M5412 Radiculopathy, cervical region: Secondary | ICD-10-CM | POA: Diagnosis not present

## 2015-10-18 DIAGNOSIS — M9901 Segmental and somatic dysfunction of cervical region: Secondary | ICD-10-CM | POA: Diagnosis not present

## 2015-10-21 DIAGNOSIS — M9901 Segmental and somatic dysfunction of cervical region: Secondary | ICD-10-CM | POA: Diagnosis not present

## 2015-10-21 DIAGNOSIS — M9902 Segmental and somatic dysfunction of thoracic region: Secondary | ICD-10-CM | POA: Diagnosis not present

## 2015-10-21 DIAGNOSIS — M5033 Other cervical disc degeneration, cervicothoracic region: Secondary | ICD-10-CM | POA: Diagnosis not present

## 2015-10-21 DIAGNOSIS — M5412 Radiculopathy, cervical region: Secondary | ICD-10-CM | POA: Diagnosis not present

## 2015-10-22 ENCOUNTER — Telehealth: Payer: Self-pay

## 2015-10-22 DIAGNOSIS — R4781 Slurred speech: Secondary | ICD-10-CM | POA: Diagnosis not present

## 2015-10-22 DIAGNOSIS — G451 Carotid artery syndrome (hemispheric): Secondary | ICD-10-CM | POA: Diagnosis not present

## 2015-10-22 DIAGNOSIS — R410 Disorientation, unspecified: Secondary | ICD-10-CM | POA: Diagnosis not present

## 2015-10-22 DIAGNOSIS — I10 Essential (primary) hypertension: Secondary | ICD-10-CM | POA: Diagnosis not present

## 2015-10-22 MED ORDER — LISINOPRIL 10 MG PO TABS: 20.0000 mg | ORAL_TABLET | Freq: Every day | ORAL | Status: DC

## 2015-10-22 NOTE — Telephone Encounter (Signed)
Patient advised. Appointment scheduled.  

## 2015-10-22 NOTE — Telephone Encounter (Signed)
I saw the BP in the neuro note through the EMR.  Inc lisinopril to 20mg .  Take 2 of the 10mg  tabs.  Would have him come in for BP check/OV in a few days.  It will take a few days for his BP to improve.   Thanks.

## 2015-10-22 NOTE — Telephone Encounter (Signed)
Pt left v/m said that [facility] on [address](pt cannot remember doctors name) would have Dr Damita Dunnings increase BP med. Pts wife said doctor name is Dr Manuella Ghazi. Pt was seen earlier today at [facility] clinic and BP was 187 Not sure bottom #the patient is not driving and wants to know if can increase med over phone. Pt is taking lisinopril 10 mg daily; pt has not missed any med. [address]. Pt request cb. No H/A, dizziness, CP or SOB. If condition changes or worsens prior to cb pt will call [facility].

## 2015-10-23 DIAGNOSIS — M5033 Other cervical disc degeneration, cervicothoracic region: Secondary | ICD-10-CM | POA: Diagnosis not present

## 2015-10-23 DIAGNOSIS — M9901 Segmental and somatic dysfunction of cervical region: Secondary | ICD-10-CM | POA: Diagnosis not present

## 2015-10-23 DIAGNOSIS — M9902 Segmental and somatic dysfunction of thoracic region: Secondary | ICD-10-CM | POA: Diagnosis not present

## 2015-10-23 DIAGNOSIS — M5412 Radiculopathy, cervical region: Secondary | ICD-10-CM | POA: Diagnosis not present

## 2015-10-25 ENCOUNTER — Encounter: Payer: Self-pay | Admitting: Family Medicine

## 2015-10-25 ENCOUNTER — Ambulatory Visit (INDEPENDENT_AMBULATORY_CARE_PROVIDER_SITE_OTHER): Payer: Medicare Other | Admitting: Family Medicine

## 2015-10-25 VITALS — BP 142/78 | HR 63 | Temp 97.9°F | Wt 186.8 lb

## 2015-10-25 DIAGNOSIS — M9901 Segmental and somatic dysfunction of cervical region: Secondary | ICD-10-CM | POA: Diagnosis not present

## 2015-10-25 DIAGNOSIS — I10 Essential (primary) hypertension: Secondary | ICD-10-CM | POA: Diagnosis not present

## 2015-10-25 DIAGNOSIS — M5033 Other cervical disc degeneration, cervicothoracic region: Secondary | ICD-10-CM | POA: Diagnosis not present

## 2015-10-25 DIAGNOSIS — M5412 Radiculopathy, cervical region: Secondary | ICD-10-CM | POA: Diagnosis not present

## 2015-10-25 DIAGNOSIS — M9902 Segmental and somatic dysfunction of thoracic region: Secondary | ICD-10-CM | POA: Diagnosis not present

## 2015-10-25 LAB — BASIC METABOLIC PANEL
BUN: 14 mg/dL (ref 6–23)
CALCIUM: 9.1 mg/dL (ref 8.4–10.5)
CO2: 28 meq/L (ref 19–32)
CREATININE: 1.42 mg/dL (ref 0.40–1.50)
Chloride: 107 mEq/L (ref 96–112)
GFR: 50.57 mL/min — ABNORMAL LOW (ref >60.00)
GLUCOSE: 113 mg/dL — AB (ref 70–99)
Potassium: 4.5 mEq/L (ref 3.5–5.1)
SODIUM: 140 meq/L (ref 135–145)

## 2015-10-25 NOTE — Assessment & Plan Note (Signed)
Much improved, continue lisinopril 20mg  a day (2x10mg ) and check bmet today.  We can refill med with 20mg  pills later on if BP stays stable.   Okay for outpatient f/u.  He doesn't have absolute contraindication to driving.  I encouraged him to talk with his family about driving.

## 2015-10-25 NOTE — Patient Instructions (Signed)
Don't change your meds for now.  Keep taking a total of 20mg  of lisinopril a day for now.  Go to the lab on the way out.  We'll contact you with your lab report. Take care.  Glad to see you.  I'll await the notes from your neurology doctor.

## 2015-10-25 NOTE — Progress Notes (Signed)
Pre visit review using our clinic review tool, if applicable. No additional management support is needed unless otherwise documented below in the visit note.  Hypertension:  BP was up at other MD clinic.  ACE inc'd to 20mg  a day in meantime.   Using medication without problems or lightheadedness: not lightheaded.   Chest pain with exertion:no Edema:no Short of breath:no  Meds, vitals, and allergies reviewed.   ROS: See HPI.  Otherwise, noncontributory.  GEN: nad, alert and oriented HEENT: mucous membranes moist NECK: supple w/o LA CV: rrr PULM: ctab, no inc wob ABD: soft, +bs EXT: no edema

## 2015-10-25 NOTE — Addendum Note (Signed)
Addended by: Marchia Bond on: 10/25/2015 01:35 PM   Modules accepted: Miquel Dunn

## 2015-10-28 ENCOUNTER — Other Ambulatory Visit: Payer: Self-pay | Admitting: *Deleted

## 2015-10-28 DIAGNOSIS — M9901 Segmental and somatic dysfunction of cervical region: Secondary | ICD-10-CM | POA: Diagnosis not present

## 2015-10-28 DIAGNOSIS — M5412 Radiculopathy, cervical region: Secondary | ICD-10-CM | POA: Diagnosis not present

## 2015-10-28 DIAGNOSIS — M5033 Other cervical disc degeneration, cervicothoracic region: Secondary | ICD-10-CM | POA: Diagnosis not present

## 2015-10-28 DIAGNOSIS — M9902 Segmental and somatic dysfunction of thoracic region: Secondary | ICD-10-CM | POA: Diagnosis not present

## 2015-10-28 MED ORDER — LISINOPRIL 20 MG PO TABS: 20.0000 mg | ORAL_TABLET | Freq: Every day | ORAL | Status: DC

## 2015-10-30 DIAGNOSIS — M5412 Radiculopathy, cervical region: Secondary | ICD-10-CM | POA: Diagnosis not present

## 2015-10-30 DIAGNOSIS — M9902 Segmental and somatic dysfunction of thoracic region: Secondary | ICD-10-CM | POA: Diagnosis not present

## 2015-10-30 DIAGNOSIS — M9901 Segmental and somatic dysfunction of cervical region: Secondary | ICD-10-CM | POA: Diagnosis not present

## 2015-10-30 DIAGNOSIS — M5033 Other cervical disc degeneration, cervicothoracic region: Secondary | ICD-10-CM | POA: Diagnosis not present

## 2015-10-31 DIAGNOSIS — R262 Difficulty in walking, not elsewhere classified: Secondary | ICD-10-CM | POA: Diagnosis not present

## 2015-10-31 DIAGNOSIS — G458 Other transient cerebral ischemic attacks and related syndromes: Secondary | ICD-10-CM | POA: Diagnosis not present

## 2015-11-05 ENCOUNTER — Encounter: Payer: Self-pay | Admitting: Family Medicine

## 2015-11-05 ENCOUNTER — Ambulatory Visit (INDEPENDENT_AMBULATORY_CARE_PROVIDER_SITE_OTHER): Payer: Medicare Other | Admitting: Family Medicine

## 2015-11-05 VITALS — BP 142/84 | HR 72 | Temp 97.6°F | Wt 183.5 lb

## 2015-11-05 DIAGNOSIS — S76311A Strain of muscle, fascia and tendon of the posterior muscle group at thigh level, right thigh, initial encounter: Secondary | ICD-10-CM

## 2015-11-05 DIAGNOSIS — I10 Essential (primary) hypertension: Secondary | ICD-10-CM | POA: Diagnosis not present

## 2015-11-05 DIAGNOSIS — R296 Repeated falls: Secondary | ICD-10-CM

## 2015-11-05 DIAGNOSIS — S76319A Strain of muscle, fascia and tendon of the posterior muscle group at thigh level, unspecified thigh, initial encounter: Secondary | ICD-10-CM | POA: Insufficient documentation

## 2015-11-05 NOTE — Progress Notes (Signed)
Pre visit review using our clinic review tool, if applicable. No additional management support is needed unless otherwise documented below in the visit note.  2 recent falls.   Initially was after his last blood draw here.  He was going to his car and missed the stop off the sidewalk.  He couldn't recover in time and went down, no injury, pain.  He caught himself on his hands on the ground.  Didn't hit his head.  Got up and went home.  He didn't tell anyone here, he just went home.    Second fall at home.  He was coming down some stairs, in low light.  He didn't count the steps correctly and tumbled down from the remaining steps from the landing.  This was about 3 days ago.  He has pain in the R thigh, "I felt a stretch in the leg at the time."  He can bear weight on it.  He didn't bruise.  No bleeding.  He didn't feel a pop or a snap.    He is using a cane today, but it wasn't adjusted to the right height.  I adjusted it at the [facility] and he felt better with it.  His wife has also fallen recently.    He had f/u with neuro.  Care everywhere notes resolved.  He has f/u EEG pending.    I offered PT and strength training, he has f/u pending with PT.  He has been taking aspirin and tylenol for the pain.    Meds, vitals, and allergies reviewed.   ROS: See HPI.  Otherwise, noncontributory.  nad ncat rrr ctab abd soft Back not ttp R hip normal ROM, both int and ext rotation, not ttp on bony prominences R hamstring proximally sore but no bruising.  No skin tear.  Able to bear weight.

## 2015-11-05 NOTE — Assessment & Plan Note (Signed)
Cane, ACE wrap, tylenol prn. WBAT and he'll f/u with PT.

## 2015-11-05 NOTE — Patient Instructions (Addendum)
I would get a fall alert button.  Call and see what they can offer.  Keep using the cane.  Likely right hamstring strain.  Should gradually resolve.  Follow up with physical therapy when possible.  You can use an ACE wrap if needed.  I'll await your neuro results.   Take care.  Glad to see you.

## 2015-11-05 NOTE — Assessment & Plan Note (Signed)
Cautions d/w pt.  I gave him the number for a company that I heard was reputable (I am unaffiliated, I have no conflict of interest) and he'll call about a fall button.  He'll f/u with PT.  Continue cane, now adjusted to the right height.

## 2015-11-05 NOTE — Assessment & Plan Note (Signed)
Reasonable control for now, continue as is.  I don't want to induce hypotension.  He agrees.

## 2015-11-07 DIAGNOSIS — R569 Unspecified convulsions: Secondary | ICD-10-CM | POA: Diagnosis not present

## 2015-12-04 DIAGNOSIS — R2681 Unsteadiness on feet: Secondary | ICD-10-CM | POA: Diagnosis not present

## 2015-12-04 DIAGNOSIS — R262 Difficulty in walking, not elsewhere classified: Secondary | ICD-10-CM | POA: Diagnosis not present

## 2015-12-17 ENCOUNTER — Telehealth: Payer: Self-pay | Admitting: Family Medicine

## 2015-12-17 DIAGNOSIS — R296 Repeated falls: Secondary | ICD-10-CM

## 2015-12-17 DIAGNOSIS — R4781 Slurred speech: Secondary | ICD-10-CM | POA: Diagnosis not present

## 2015-12-17 DIAGNOSIS — R410 Disorientation, unspecified: Secondary | ICD-10-CM | POA: Diagnosis not present

## 2015-12-17 DIAGNOSIS — F05 Delirium due to known physiological condition: Secondary | ICD-10-CM | POA: Diagnosis not present

## 2015-12-17 NOTE — Telephone Encounter (Signed)
Patient called and said he'd like to be referred to [facility].  Patient said he's not steady after the fall.  Patient has already made appointment on 12/25/15.

## 2015-12-17 NOTE — Telephone Encounter (Signed)
Ordered. Thanks

## 2015-12-25 DIAGNOSIS — R2681 Unsteadiness on feet: Secondary | ICD-10-CM | POA: Diagnosis not present

## 2015-12-25 DIAGNOSIS — R262 Difficulty in walking, not elsewhere classified: Secondary | ICD-10-CM | POA: Diagnosis not present

## 2015-12-26 DIAGNOSIS — D2271 Melanocytic nevi of right lower limb, including hip: Secondary | ICD-10-CM | POA: Diagnosis not present

## 2015-12-26 DIAGNOSIS — D225 Melanocytic nevi of trunk: Secondary | ICD-10-CM | POA: Diagnosis not present

## 2015-12-26 DIAGNOSIS — Z85828 Personal history of other malignant neoplasm of skin: Secondary | ICD-10-CM | POA: Diagnosis not present

## 2015-12-26 DIAGNOSIS — D2262 Melanocytic nevi of left upper limb, including shoulder: Secondary | ICD-10-CM | POA: Diagnosis not present

## 2015-12-31 DIAGNOSIS — R2681 Unsteadiness on feet: Secondary | ICD-10-CM | POA: Diagnosis not present

## 2015-12-31 DIAGNOSIS — R262 Difficulty in walking, not elsewhere classified: Secondary | ICD-10-CM | POA: Diagnosis not present

## 2016-01-01 DIAGNOSIS — F05 Delirium due to known physiological condition: Secondary | ICD-10-CM | POA: Diagnosis not present

## 2016-01-02 DIAGNOSIS — R262 Difficulty in walking, not elsewhere classified: Secondary | ICD-10-CM | POA: Diagnosis not present

## 2016-01-02 DIAGNOSIS — R2681 Unsteadiness on feet: Secondary | ICD-10-CM | POA: Diagnosis not present

## 2016-01-04 DIAGNOSIS — R2681 Unsteadiness on feet: Secondary | ICD-10-CM | POA: Diagnosis not present

## 2016-01-04 DIAGNOSIS — R262 Difficulty in walking, not elsewhere classified: Secondary | ICD-10-CM | POA: Diagnosis not present

## 2016-01-07 DIAGNOSIS — R262 Difficulty in walking, not elsewhere classified: Secondary | ICD-10-CM | POA: Diagnosis not present

## 2016-01-07 DIAGNOSIS — R2681 Unsteadiness on feet: Secondary | ICD-10-CM | POA: Diagnosis not present

## 2016-01-10 DIAGNOSIS — R262 Difficulty in walking, not elsewhere classified: Secondary | ICD-10-CM | POA: Diagnosis not present

## 2016-01-10 DIAGNOSIS — R2681 Unsteadiness on feet: Secondary | ICD-10-CM | POA: Diagnosis not present

## 2016-01-15 DIAGNOSIS — R262 Difficulty in walking, not elsewhere classified: Secondary | ICD-10-CM | POA: Diagnosis not present

## 2016-01-15 DIAGNOSIS — R2681 Unsteadiness on feet: Secondary | ICD-10-CM | POA: Diagnosis not present

## 2016-01-23 DIAGNOSIS — R2681 Unsteadiness on feet: Secondary | ICD-10-CM | POA: Diagnosis not present

## 2016-01-23 DIAGNOSIS — R262 Difficulty in walking, not elsewhere classified: Secondary | ICD-10-CM | POA: Diagnosis not present

## 2016-01-28 DIAGNOSIS — R262 Difficulty in walking, not elsewhere classified: Secondary | ICD-10-CM | POA: Diagnosis not present

## 2016-01-28 DIAGNOSIS — R2681 Unsteadiness on feet: Secondary | ICD-10-CM | POA: Diagnosis not present

## 2016-01-30 DIAGNOSIS — R2681 Unsteadiness on feet: Secondary | ICD-10-CM | POA: Diagnosis not present

## 2016-01-30 DIAGNOSIS — R262 Difficulty in walking, not elsewhere classified: Secondary | ICD-10-CM | POA: Diagnosis not present

## 2016-02-03 DIAGNOSIS — R2681 Unsteadiness on feet: Secondary | ICD-10-CM | POA: Diagnosis not present

## 2016-02-03 DIAGNOSIS — R262 Difficulty in walking, not elsewhere classified: Secondary | ICD-10-CM | POA: Diagnosis not present

## 2016-02-05 DIAGNOSIS — R262 Difficulty in walking, not elsewhere classified: Secondary | ICD-10-CM | POA: Diagnosis not present

## 2016-02-05 DIAGNOSIS — R2681 Unsteadiness on feet: Secondary | ICD-10-CM | POA: Diagnosis not present

## 2016-02-07 DIAGNOSIS — R262 Difficulty in walking, not elsewhere classified: Secondary | ICD-10-CM | POA: Diagnosis not present

## 2016-02-07 DIAGNOSIS — R2681 Unsteadiness on feet: Secondary | ICD-10-CM | POA: Diagnosis not present

## 2016-02-10 DIAGNOSIS — R2681 Unsteadiness on feet: Secondary | ICD-10-CM | POA: Diagnosis not present

## 2016-02-10 DIAGNOSIS — R262 Difficulty in walking, not elsewhere classified: Secondary | ICD-10-CM | POA: Diagnosis not present

## 2016-02-14 DIAGNOSIS — R2681 Unsteadiness on feet: Secondary | ICD-10-CM | POA: Diagnosis not present

## 2016-02-14 DIAGNOSIS — R262 Difficulty in walking, not elsewhere classified: Secondary | ICD-10-CM | POA: Diagnosis not present

## 2016-02-19 DIAGNOSIS — R262 Difficulty in walking, not elsewhere classified: Secondary | ICD-10-CM | POA: Diagnosis not present

## 2016-02-19 DIAGNOSIS — R2681 Unsteadiness on feet: Secondary | ICD-10-CM | POA: Diagnosis not present

## 2016-02-21 DIAGNOSIS — R262 Difficulty in walking, not elsewhere classified: Secondary | ICD-10-CM | POA: Diagnosis not present

## 2016-02-21 DIAGNOSIS — R2681 Unsteadiness on feet: Secondary | ICD-10-CM | POA: Diagnosis not present

## 2016-02-26 ENCOUNTER — Telehealth: Payer: Self-pay | Admitting: *Deleted

## 2016-02-26 DIAGNOSIS — M25512 Pain in left shoulder: Secondary | ICD-10-CM

## 2016-02-26 NOTE — Telephone Encounter (Signed)
I put in the order but if he has worsening pain and/or new/worsening tremor, then I want to check him at [facility] too.  Okay to do the xray same day as the OV, before the OV.  Thanks.

## 2016-02-26 NOTE — Telephone Encounter (Signed)
Spoke to pt and advised per Dr Damita Dunnings. Pt states that the pain and tremor have not worsened, and he will RTO 5/25 for XRAY

## 2016-02-26 NOTE — Telephone Encounter (Signed)
Patient called requesting an xray on his left shoulder. Patient stated that he has talked to you about the shoulder pain and now his left hand is shaking. Patient requested an order for the xray since he has discussed this with you at previous office visits.

## 2016-02-27 ENCOUNTER — Ambulatory Visit (INDEPENDENT_AMBULATORY_CARE_PROVIDER_SITE_OTHER)
Admission: RE | Admit: 2016-02-27 | Discharge: 2016-02-27 | Disposition: A | Discharge status: Home / Self Care | Payer: Medicare Other | Source: Ambulatory Visit | Attending: Family Medicine | Admitting: Family Medicine

## 2016-02-27 DIAGNOSIS — M25512 Pain in left shoulder: Secondary | ICD-10-CM | POA: Diagnosis not present

## 2016-02-27 DIAGNOSIS — M19012 Primary osteoarthritis, left shoulder: Secondary | ICD-10-CM | POA: Diagnosis not present

## 2016-03-06 ENCOUNTER — Ambulatory Visit (INDEPENDENT_AMBULATORY_CARE_PROVIDER_SITE_OTHER): Payer: Medicare Other | Admitting: Family Medicine

## 2016-03-06 ENCOUNTER — Encounter: Payer: Self-pay | Admitting: Family Medicine

## 2016-03-06 VITALS — BP 112/70 | HR 69 | Temp 97.8°F | Wt 183.2 lb

## 2016-03-06 DIAGNOSIS — R251 Tremor, unspecified: Secondary | ICD-10-CM | POA: Diagnosis not present

## 2016-03-06 LAB — COMPREHENSIVE METABOLIC PANEL
ALBUMIN: 4.2 g/dL (ref 3.6–5.1)
ALT: 31 U/L (ref 9–46)
AST: 26 U/L (ref 10–35)
Alkaline Phosphatase: 96 U/L (ref 40–115)
BUN: 17 mg/dL (ref 7–25)
CALCIUM: 9.6 mg/dL (ref 8.6–10.3)
CHLORIDE: 108 mmol/L (ref 98–110)
CO2: 26 mmol/L (ref 20–31)
Creat: 1.51 mg/dL — ABNORMAL HIGH (ref 0.70–1.11)
GLUCOSE: 99 mg/dL (ref 65–99)
POTASSIUM: 4.4 mmol/L (ref 3.5–5.3)
Sodium: 141 mmol/L (ref 135–146)
Total Bilirubin: 0.6 mg/dL (ref 0.2–1.2)
Total Protein: 7.8 g/dL (ref 6.1–8.1)

## 2016-03-06 LAB — CBC WITH DIFFERENTIAL/PLATELET
BASOS ABS: 57 {cells}/uL (ref 0–200)
Basophils Relative: 1 %
Eosinophils Absolute: 114 cells/uL (ref 15–500)
Eosinophils Relative: 2 %
HEMATOCRIT: 43.4 % (ref 38.5–50.0)
HEMOGLOBIN: 14.7 g/dL (ref 13.2–17.1)
LYMPHS ABS: 1596 {cells}/uL (ref 850–3900)
LYMPHS PCT: 28 %
MCH: 30.8 pg (ref 27.0–33.0)
MCHC: 33.9 g/dL (ref 32.0–36.0)
MCV: 91 fL (ref 80.0–100.0)
MONO ABS: 399 {cells}/uL (ref 200–950)
MPV: 9.4 fL (ref 7.5–12.5)
Monocytes Relative: 7 %
NEUTROS PCT: 62 %
Neutro Abs: 3534 cells/uL (ref 1500–7800)
Platelets: 197 10*3/uL (ref 140–400)
RBC: 4.77 MIL/uL (ref 4.20–5.80)
RDW: 14.5 % (ref 11.0–15.0)
WBC: 5.7 10*3/uL (ref 3.8–10.8)

## 2016-03-06 LAB — TSH: TSH: 1.61 mIU/L (ref 0.40–4.50)

## 2016-03-06 NOTE — Patient Instructions (Signed)
Go to the lab on the way out.  We'll contact you with your lab report. Don't change your meds for now.   We'll be in touch.   I would defer working up your shoulder more until we can get more information on the tremor.  Take care.  Glad to see you.

## 2016-03-06 NOTE — Progress Notes (Signed)
Pre visit review using our clinic review tool, if applicable. No additional management support is needed unless otherwise documented below in the visit note.  He was told he was likely having SZ activity, started on escalating dose of lamictal in the meantime, up to 50mg  BID now.    Tremor in L hand when hold a saucer, when holding it out from the body.  Tremor started this year, he isn't sure if before or after the lamictal start.  No jaw or tongue sx, no leg tremor.  No R hand tremor.  No dysphagia.  Walking less than prev but has the occ feeling of falling forward but corrects and usually doesn't fall.  Last fell months ago, in 10/2015.    He continues to have L shoulder pain but no dec in ROM.  Able to int/ext rotate the shoulder.  Prev imaging neg except for mild OA changes, images reviewed with patient.  No weakness in the L arm.    Meds, vitals, and allergies reviewed.   ROS: Per HPI unless specifically indicated in ROS section   GEN: nad, alert and oriented HEENT: mucous membranes moist NECK: supple w/o LA CV: rrr. PULM: ctab, no inc wob ABD: soft, +bs EXT: no edema CN 2-12 wnl B, S/S/DTR wnl x4 but L hand tremor noted with holding a cup (he brought cut and saucer from home). L shoulder- not ttp, able to int/ext rotate the shoulder w/o arm drop.  No impingement.  No weakness on supraspinatus testing.

## 2016-03-08 DIAGNOSIS — R251 Tremor, unspecified: Secondary | ICD-10-CM | POA: Insufficient documentation

## 2016-03-08 NOTE — Assessment & Plan Note (Signed)
He doesn't have typical sx for rotator cuff pathology, d/w pt.  I would defer working up his shoulder more until we can get more information on the tremor.  Would be atypical for lamictal ADE, based on available med info, especially only in one extremity.  Ddx d/w pt, w/o clear dx.   Would check basic labs today and then refer to neuro for eval.  Would appreciate input from Dr. Carles Collet.

## 2016-03-30 ENCOUNTER — Ambulatory Visit (INDEPENDENT_AMBULATORY_CARE_PROVIDER_SITE_OTHER): Payer: Medicare Other | Admitting: Neurology

## 2016-03-30 ENCOUNTER — Other Ambulatory Visit (INDEPENDENT_AMBULATORY_CARE_PROVIDER_SITE_OTHER): Payer: Medicare Other

## 2016-03-30 ENCOUNTER — Encounter: Payer: Self-pay | Admitting: Neurology

## 2016-03-30 VITALS — BP 132/88 | HR 64 | Ht 67.0 in | Wt 183.0 lb

## 2016-03-30 DIAGNOSIS — G250 Essential tremor: Secondary | ICD-10-CM | POA: Diagnosis not present

## 2016-03-30 DIAGNOSIS — E538 Deficiency of other specified B group vitamins: Secondary | ICD-10-CM

## 2016-03-30 LAB — VITAMIN B12: VITAMIN B 12: 150 pg/mL — AB (ref 211–911)

## 2016-03-30 NOTE — Patient Instructions (Signed)
1. Your provider has requested that you have labwork completed today. Please go to [facility] ([id number]) on the second floor of this building before leaving the office today. You do not need to check in. If you are not called within 15 minutes please check with the front desk.   

## 2016-03-30 NOTE — Progress Notes (Signed)
Justin Ford was seen today in the movement disorders clinic for neurologic consultation at the request of Elsie Stain, MD.  The consultation is for the evaluation of tremor.  Tremor started 1-2 years ago.  It is worse when something of weight is in the hand and he notes it when the L hand is resting on the steering wheel.  It is only in the L hand.  He is R hand dominant.     Specific Symptoms:  Tremor: Yes.      Affected by caffeine:  No. (2 lg cups coffee per day)  Affected by alcohol:  No. (3-4 glasses per week)  Affected by stress:  No.  Affected by fatigue:  No.  Spills soup if on spoon:  No. (no trouble because using R hand)  Spills glass of liquid if full:  No.  Affects ADL's (tying shoes, brushing teeth, etc):  No.  Family hx of similar:  Yes.   ("thinks" that his sister may have had PD as shook later in life) Voice: no change Sleep: no issue  Vivid Dreams:  No.  Acting out dreams:  No. Wet Pillows: No. Postural symptoms:  Yes.   - "I don't walk with the same stride and not as steady as I used to be"  Falls?  No. Bradykinesia symptoms: slow movements Loss of smell:  No. Loss of taste:  No. Urinary Incontinence:  No. (noturia x 2) Difficulty Swallowing:  No. Handwriting, micrographia: No., but has trouble writing as tapers off some at the end of word Depression:  No. Memory changes:  Yes.   (states that he doesn't remember as good as should but overall okay) Hallucinations:  No.  visual distortions: No. N/V:  No. Lightheaded:  No.  Syncope: No. Diplopia:  No.   Neuroimaging has previously been performed.  It is available for my review today.  It was done on 09/23/15.  It had severe WMD and mild atrophy.  PREVIOUS MEDICATIONS: none to date  ALLERGIES:   Allergies  Allergen Reactions  . Lipitor [Atorvastatin] Other (See Comments)    Reaction:  Unknown     CURRENT MEDICATIONS:  Outpatient Encounter Prescriptions as of 03/30/2016  Medication Sig  .  acetaminophen (TYLENOL) 325 MG tablet Take 325-650 mg by mouth 3 (three) times daily as needed (for pain).  [name] aspirin 81 MG EC tablet Take 1 tablet (81 mg total) by mouth daily.  . clopidogrel (PLAVIX) 75 MG tablet Take 1 tablet (75 mg total) by mouth daily.  [name] lamoTRIgine (LAMICTAL) 25 MG tablet Take 50 mg by mouth 2 (two) times daily.  [name] lisinopril (PRINIVIL,ZESTRIL) 20 MG tablet Take 1 tablet (20 mg total) by mouth daily.  . pantoprazole (PROTONIX) 40 MG tablet Take 1 tablet (40 mg total) by mouth daily.  . rosuvastatin (CRESTOR) 20 MG tablet Take 1 tablet (20 mg total) by mouth daily.   No facility-administered encounter medications on file as of 03/30/2016.    PAST MEDICAL HISTORY:   Past Medical History  Diagnosis Date  . Blood in stool     colonoscopy done ~2010  . GERD (gastroesophageal reflux disease)   . Hypertension   . Hyperlipidemia   . Cancer ([place])     basal cell CA per derm  . Kidney stones   . Barrett's esophagus   . TIA (transient ischemic attack)     2016    PAST SURGICAL HISTORY:   Past Surgical History  Procedure Laterality Date  .  Shoulder surgery      per Dr. Theda Sers, R shoulder  . Prostate biopsy      neg x2 per patient    SOCIAL HISTORY:   Social History   Social History  . Marital Status: Married    Spouse Name: N/A  . Number of Children: N/A  . Years of Education: N/A   Occupational History  . retired     Optometrist   Social History Main Topics  . Smoking status: Former Smoker -- 0.25 packs/day for 4 years    Quit date: 10/05/1950  . Smokeless tobacco: Never Used  . Alcohol Use: 1.2 oz/week    2 Cans of beer per week     Comment: wine 2-3 times weekly  . Drug Use: No  . Sexual Activity: Not on file   Other Topics Concern  . Not on file   Social History Narrative   Married 1951   From [place].    Retired Therapist, nutritional to read   2 kids, 2 grandkids (grandkids are local)    FAMILY  HISTORY:   Family Status  Relation Status Death Age  . Mother Deceased     dementia  . Father Deceased 24    ETOH  . Sister Deceased     3, lung issues (2), PD (possibly)  . Brother Alive     MI  . Sister Alive     muscle issues    ROS:  A complete 10 system review of systems was obtained and was unremarkable apart from what is mentioned above.  PHYSICAL EXAMINATION:    VITALS:   Filed Vitals:   03/30/16 1232  BP: 132/88  Pulse: 64  Height: 5\' 7"  (1.702 m)  Weight: 183 lb (83.008 kg)    GEN:  The patient appears stated age and is in NAD. HEENT:  Normocephalic, atraumatic.  The mucous membranes are moist. The superficial temporal arteries are without ropiness or tenderness. CV:  RRR Lungs:  CTAB Neck/HEME:  There are no carotid bruits bilaterally.  Neurological examination:  Orientation: The patient is alert and oriented x3. Fund of knowledge is appropriate.  Recent and remote memory are intact.  Attention and concentration are normal.    Able to name objects and repeat phrases. Cranial nerves: There is good facial symmetry. Pupils are equal round and reactive to light bilaterally. Fundoscopic exam reveals clear margins bilaterally. Extraocular muscles are intact. The visual fields are full to confrontational testing. The speech is fluent and clear. Soft palate rises symmetrically and there is no tongue deviation. Hearing is intact to conversational tone. Sensation: Sensation is intact to light and pinprick throughout (facial, trunk, extremities). Vibration is mildly decreased at the bilateral big toe. There is no extinction with double simultaneous stimulation. There is no sensory dermatomal level identified. Motor: Strength is 5/5 in the bilateral upper and lower extremities.   Shoulder shrug is equal and symmetric.  There is no pronator drift. Deep tendon reflexes: Deep tendon reflexes are 2/4 at the bilateral biceps, triceps, brachioradialis, patella and trace at the  bilateral achilles. Plantar responses are downgoing bilaterally.  Movement examination: Tone: There is normal tone in the bilateral upper extremities.  The tone in the lower extremities is normal.  Abnormal movements: There is mild resting tremor of the L thumb.  There is postural tremor on the R with outstretched hands. He has no trouble pouring full glass of water from one glass to another.  He does develop some tremor when given a weight and asked to hold arm straight out.  In wing beating position, much less tremor. Coordination:  There is mild decremation with RAM's, with finger taps on the L and heel taps on the L.   Gait and Station: The patient has no difficulty arising out of a deep-seated chair without the use of the hands. The patient's stride length is normal but wide based gait.       LABS  Lab Results  Component Value Date   TSH 1.61 03/06/2016     Chemistry      Component Value Date/Time   NA 141 03/06/2016 1601   K 4.4 03/06/2016 1601   K 4.2 08/02/2014 1605   CL 108 03/06/2016 1601   CO2 26 03/06/2016 1601   BUN 17 03/06/2016 1601   CREATININE 1.51* 03/06/2016 1601   CREATININE 1.42 10/25/2015 1124      Component Value Date/Time   CALCIUM 9.6 03/06/2016 1601   ALKPHOS 96 03/06/2016 1601   AST 26 03/06/2016 1601   ALT 31 03/06/2016 1601   BILITOT 0.6 03/06/2016 1601     Lab Results  Component Value Date   VITAMINB12 166* 06/22/2015     ASSESSMENT/PLAN:  1.  Tremor, likely essential in nature.  -He thought that this was only on the L but postural tremor was noted on the R.  He only had L hand tremor when holding a weight straight out.  However, he has some minor parkinsonian features including rest tremor.  I reiterated to him that he does not today meet criteria for PD, but we should watch this and would want to see him back q 6-8 months.  He wants no medication for tremor.  2.  B12 deficiency  -This was last checked in 06/2015.  Will recheck.  If still  low, start supplement.  3.  Follow up is anticipated in the next 6-8 months, sooner should new neurologic issues arise.  Much greater than 50% of this visit was spent in counseling and coordinating care.  Total face to face time:  45 min

## 2016-03-31 ENCOUNTER — Telehealth: Payer: Self-pay | Admitting: Neurology

## 2016-03-31 NOTE — Telephone Encounter (Signed)
-----   Message from Aniwa, DO sent at 03/30/2016  4:34 PM EDT ----- Let pt know that B12 is pretty low.  I think that I would recommend injections to get up and then maybe oral later.  Injections weekly x 1 month and then monthly.  Can likely arrange via PCP if more convenient

## 2016-03-31 NOTE — Telephone Encounter (Signed)
Left message on machine for patient to call back.

## 2016-03-31 NOTE — Telephone Encounter (Signed)
Patient made aware. He prefers to do this at Dr. Josefine Class office due to location. He will call their office to schedule- they can see our notes and recommendations in the system.   He did want to make Dr. Carles Collet aware he does have a slight "hissing" noise in his ears that comes and goes for the last two weeks. He compares this to a heated kettle on a stove. He just wanted her aware so it was in his chart. He will call with any other problems or questions.

## 2016-03-31 NOTE — Telephone Encounter (Signed)
Patient was returning your call. Will be available anytime. Thanks

## 2016-04-17 DIAGNOSIS — Z961 Presence of intraocular lens: Secondary | ICD-10-CM | POA: Diagnosis not present

## 2016-05-14 DIAGNOSIS — M9902 Segmental and somatic dysfunction of thoracic region: Secondary | ICD-10-CM | POA: Diagnosis not present

## 2016-05-14 DIAGNOSIS — M5033 Other cervical disc degeneration, cervicothoracic region: Secondary | ICD-10-CM | POA: Diagnosis not present

## 2016-05-14 DIAGNOSIS — M9901 Segmental and somatic dysfunction of cervical region: Secondary | ICD-10-CM | POA: Diagnosis not present

## 2016-05-14 DIAGNOSIS — M5412 Radiculopathy, cervical region: Secondary | ICD-10-CM | POA: Diagnosis not present

## 2016-05-18 ENCOUNTER — Telehealth: Payer: Self-pay | Admitting: Family Medicine

## 2016-05-18 NOTE — Telephone Encounter (Signed)
Pt states he has been getting bruises on his upper arm.  His pharmacist states the patient needs bloodwork done because they don't think Lisinopril would cause this.  Do you want the patient to get bloodwork and do you want him to have a visit? Also, Pt saw Dr. Carles Collet- He wants to know what is going on from his visit with her.

## 2016-05-18 NOTE — Telephone Encounter (Signed)
Needs OV re: bruising.   Per Dr. Doristine Devoid last OV note and labs: Tremor, likely essential in nature.                       -He thought that this was only on the L but postural tremor was noted on the R.  He only had L hand tremor when holding a weight straight out.  However, he has some minor parkinsonian features including rest tremor.  I reiterated to him that he does not today meet criteria for PD, but we should watch this and would want to see him back q 6-8 months.  He wants no medication for tremor.  B12 is pretty low. I think that I would recommend injections to get up and then maybe oral later. Injections weekly x 1 month and then monthly. Can likely arrange via PCP if more convenient.   I don't recall hearing from patient about setting anything up here.   Thanks.

## 2016-05-19 NOTE — Telephone Encounter (Signed)
Spoke with patient who seems to be more concerned about his bruising than anything else.  Appt scheduled with Dr. Damita Dunnings for bruising issues on arms and to implement Vit B12 injections.

## 2016-05-19 NOTE — Telephone Encounter (Signed)
Left detailed message on voicemail to return call. 

## 2016-05-20 ENCOUNTER — Ambulatory Visit (INDEPENDENT_AMBULATORY_CARE_PROVIDER_SITE_OTHER): Payer: Medicare Other | Admitting: Family Medicine

## 2016-05-20 ENCOUNTER — Encounter: Payer: Self-pay | Admitting: Family Medicine

## 2016-05-20 VITALS — BP 118/70 | HR 68 | Temp 98.6°F | Wt 179.0 lb

## 2016-05-20 DIAGNOSIS — T148XXA Other injury of unspecified body region, initial encounter: Secondary | ICD-10-CM

## 2016-05-20 DIAGNOSIS — T148 Other injury of unspecified body region: Secondary | ICD-10-CM

## 2016-05-20 DIAGNOSIS — E538 Deficiency of other specified B group vitamins: Secondary | ICD-10-CM | POA: Diagnosis not present

## 2016-05-20 MED ORDER — CYANOCOBALAMIN 1000 MCG/ML IJ SOLN
1000.0000 ug | Freq: Once | INTRAMUSCULAR | Status: AC
  Administered 2016-05-20: 1000 ug via INTRAMUSCULAR

## 2016-05-20 MED ORDER — CYANOCOBALAMIN 1000 MCG/ML IJ SOLN: 1000.0000 ug | INTRAMUSCULAR | 0 refills | Status: DC

## 2016-05-20 NOTE — Patient Instructions (Signed)
B12 shots: Weekly for 4 doses then monthly.  Recheck B12 level before a shot in about 4 months.   Go to the lab on the way out.  We'll contact you with your lab report. Bruising is likely from the combination of aspirin and plavix.  If it gets worse, then let me know.  I would continue both meds for now.  Take care.  Glad to see you.

## 2016-05-20 NOTE — Progress Notes (Signed)
Bruising noted on B arms.  On Aspirin and plavix.  No trauma.  No blood in stool, no blood in urine.    B12 def d/w pt.  Due for injection today.  Had been noted prev on labs.  Hasn't been started on oral replacement (d/w pt would likely be ineffective, given he doesn't avoid B12 containing foods in diet).  Prev labs d/w pt.   Meds, vitals, and allergies reviewed.   ROS: Per HPI unless specifically indicated in ROS section   nad ncat rrr ctab B arm bruising, lower biceps area, small area on each arm, about 3cm across No bruising o/w Ext w/o edema

## 2016-05-20 NOTE — Progress Notes (Signed)
Pre visit review using our clinic review tool, if applicable. No additional management support is needed unless otherwise documented below in the visit note. 

## 2016-05-21 DIAGNOSIS — T148XXA Other injury of unspecified body region, initial encounter: Secondary | ICD-10-CM | POA: Insufficient documentation

## 2016-05-21 DIAGNOSIS — E538 Deficiency of other specified B group vitamins: Secondary | ICD-10-CM | POA: Insufficient documentation

## 2016-05-21 LAB — CBC WITH DIFFERENTIAL/PLATELET
Basophils Absolute: 0.1 10*3/uL (ref 0.0–0.1)
Basophils Relative: 1 % (ref 0.0–3.0)
EOS ABS: 0.2 10*3/uL (ref 0.0–0.7)
EOS PCT: 3.5 % (ref 0.0–5.0)
HEMATOCRIT: 41.8 % (ref 39.0–52.0)
HEMOGLOBIN: 14.2 g/dL (ref 13.0–17.0)
LYMPHS PCT: 25.5 % (ref 12.0–46.0)
Lymphs Abs: 1.4 10*3/uL (ref 0.7–4.0)
MCHC: 34.1 g/dL (ref 30.0–36.0)
MCV: 91.4 fl (ref 78.0–100.0)
MONO ABS: 0.4 10*3/uL (ref 0.1–1.0)
Monocytes Relative: 6.4 % (ref 3.0–12.0)
Neutro Abs: 3.6 10*3/uL (ref 1.4–7.7)
Neutrophils Relative %: 63.6 % (ref 43.0–77.0)
Platelets: 171 10*3/uL (ref 150.0–400.0)
RBC: 4.57 Mil/uL (ref 4.22–5.81)
RDW: 14 % (ref 11.5–15.5)
WBC: 5.6 10*3/uL (ref 4.0–10.5)

## 2016-05-21 NOTE — Assessment & Plan Note (Signed)
B12 shots: Weekly for 4 doses then monthly.  Recheck B12 level before a shot in about 4 months.   He agrees.  1st shot done today.

## 2016-05-21 NOTE — Assessment & Plan Note (Signed)
Bruising is likely from the combination of aspirin and plavix.  If it gets worse, then he'll let me know.  I would continue both meds for now. Check cbc today.

## 2016-05-22 ENCOUNTER — Telehealth: Payer: Self-pay | Admitting: Family Medicine

## 2016-05-22 NOTE — Telephone Encounter (Signed)
Opened error

## 2016-05-27 ENCOUNTER — Other Ambulatory Visit: Payer: Medicare Other

## 2016-05-27 ENCOUNTER — Ambulatory Visit (INDEPENDENT_AMBULATORY_CARE_PROVIDER_SITE_OTHER): Payer: Medicare Other | Admitting: *Deleted

## 2016-05-27 DIAGNOSIS — E538 Deficiency of other specified B group vitamins: Secondary | ICD-10-CM

## 2016-05-27 MED ORDER — CYANOCOBALAMIN 1000 MCG/ML IJ SOLN
1000.0000 ug | Freq: Once | INTRAMUSCULAR | Status: AC
  Administered 2016-05-27: 1000 ug via INTRAMUSCULAR

## 2016-06-02 DIAGNOSIS — E538 Deficiency of other specified B group vitamins: Secondary | ICD-10-CM | POA: Diagnosis not present

## 2016-06-02 DIAGNOSIS — R569 Unspecified convulsions: Secondary | ICD-10-CM | POA: Diagnosis not present

## 2016-06-03 ENCOUNTER — Ambulatory Visit: Payer: Medicare Other

## 2016-06-03 ENCOUNTER — Ambulatory Visit (INDEPENDENT_AMBULATORY_CARE_PROVIDER_SITE_OTHER): Payer: Medicare Other | Admitting: Family Medicine

## 2016-06-03 DIAGNOSIS — E538 Deficiency of other specified B group vitamins: Secondary | ICD-10-CM | POA: Diagnosis not present

## 2016-06-03 MED ORDER — CYANOCOBALAMIN 1000 MCG/ML IJ SOLN
1000.0000 ug | Freq: Once | INTRAMUSCULAR | Status: AC
  Administered 2016-06-03: 1000 ug via INTRAMUSCULAR

## 2016-06-03 NOTE — Progress Notes (Signed)
Nurse visit

## 2016-06-11 ENCOUNTER — Ambulatory Visit (INDEPENDENT_AMBULATORY_CARE_PROVIDER_SITE_OTHER): Payer: Medicare Other | Admitting: Family Medicine

## 2016-06-11 DIAGNOSIS — E538 Deficiency of other specified B group vitamins: Secondary | ICD-10-CM

## 2016-06-11 MED ORDER — CYANOCOBALAMIN 1000 MCG/ML IJ SOLN
1000.0000 ug | Freq: Once | INTRAMUSCULAR | Status: AC
  Administered 2016-06-11: 1000 ug via INTRAMUSCULAR

## 2016-06-12 NOTE — Progress Notes (Signed)
Nurse visit for b12

## 2016-07-14 ENCOUNTER — Ambulatory Visit (INDEPENDENT_AMBULATORY_CARE_PROVIDER_SITE_OTHER): Payer: Medicare Other

## 2016-07-14 ENCOUNTER — Ambulatory Visit: Payer: Medicare Other

## 2016-07-14 DIAGNOSIS — Z23 Encounter for immunization: Secondary | ICD-10-CM

## 2016-07-14 DIAGNOSIS — E538 Deficiency of other specified B group vitamins: Secondary | ICD-10-CM | POA: Diagnosis not present

## 2016-07-14 MED ORDER — CYANOCOBALAMIN 1000 MCG/ML IJ SOLN
1000.0000 ug | Freq: Once | INTRAMUSCULAR | Status: AC
  Administered 2016-07-14: 1000 ug via INTRAMUSCULAR

## 2016-08-13 ENCOUNTER — Ambulatory Visit (INDEPENDENT_AMBULATORY_CARE_PROVIDER_SITE_OTHER): Payer: Medicare Other

## 2016-08-13 DIAGNOSIS — E538 Deficiency of other specified B group vitamins: Secondary | ICD-10-CM

## 2016-08-13 MED ORDER — CYANOCOBALAMIN 1000 MCG/ML IJ SOLN
1000.0000 ug | Freq: Once | INTRAMUSCULAR | Status: AC
  Administered 2016-08-13: 1000 ug via INTRAMUSCULAR

## 2016-08-31 ENCOUNTER — Telehealth: Payer: Self-pay | Admitting: Family Medicine

## 2016-08-31 MED ORDER — LISINOPRIL 20 MG PO TABS: 20.0000 mg | ORAL_TABLET | Freq: Every day | ORAL | 3 refills | Status: DC

## 2016-08-31 NOTE — Telephone Encounter (Signed)
Patient advised.

## 2016-08-31 NOTE — Telephone Encounter (Signed)
Pt called in to inquire about his lisinopril prescription - he picked it up and there was 30 pills in it and it had 90 pills in the past- can he have the 90 pill prescription called in?  Please contact him to to let him know if he can have the 90 day supply.

## 2016-08-31 NOTE — Telephone Encounter (Signed)
The last rx I see sent for lisinopril had #90.  Resent in the meantime.  Thanks.

## 2016-09-09 ENCOUNTER — Other Ambulatory Visit: Payer: Medicare Other

## 2016-09-09 ENCOUNTER — Ambulatory Visit: Payer: Medicare Other

## 2016-09-11 ENCOUNTER — Encounter: Payer: Self-pay | Admitting: Family Medicine

## 2016-09-11 ENCOUNTER — Ambulatory Visit (INDEPENDENT_AMBULATORY_CARE_PROVIDER_SITE_OTHER): Payer: Medicare Other | Admitting: Family Medicine

## 2016-09-11 VITALS — BP 134/80 | HR 80 | Temp 98.0°F | Wt 186.2 lb

## 2016-09-11 DIAGNOSIS — M109 Gout, unspecified: Secondary | ICD-10-CM | POA: Diagnosis not present

## 2016-09-11 DIAGNOSIS — E538 Deficiency of other specified B group vitamins: Secondary | ICD-10-CM | POA: Diagnosis not present

## 2016-09-11 LAB — URIC ACID: URIC ACID, SERUM: 7.5 mg/dL (ref 4.0–7.8)

## 2016-09-11 LAB — VITAMIN B12: VITAMIN B 12: 354 pg/mL (ref 211–911)

## 2016-09-11 MED ORDER — PREDNISONE 10 MG PO TABS: ORAL_TABLET | ORAL | 0 refills | Status: DC

## 2016-09-11 MED ORDER — CYANOCOBALAMIN 1000 MCG/ML IJ SOLN
1000.0000 ug | Freq: Once | INTRAMUSCULAR | Status: AC
Start: 2016-09-11 — End: 2016-09-11
  Administered 2016-09-11: 1000 ug via INTRAMUSCULAR

## 2016-09-11 NOTE — Addendum Note (Signed)
Addended by: Royann Shivers A on: 09/11/2016 10:46 AM   Modules accepted: Orders

## 2016-09-11 NOTE — Progress Notes (Signed)
Subjective:    Patient ID: Justin Ford, male    DOB: 1930-07-30, 82 y.o.   MRN: [id number]  HPI This is a pleasant 82 yo male who presents today with left foot pain. Pain is around and under great toe. Started over a week ago with sudden onset of pain with walking. No fall or injury. Area is red and swollen, got better but is now worse. Difficulty wearing shoes. Took acetaminophen without relief. No fever, no chest pain or SOB.  Takes Plavix and asa 81 mg for history TIA 2016.    Past Medical History:  Diagnosis Date  . Barrett's esophagus   . Blood in stool    colonoscopy done ~2010  . Cancer ([place])    basal cell CA per derm  . GERD (gastroesophageal reflux disease)   . Hyperlipidemia   . Hypertension   . Kidney stones   . TIA (transient ischemic attack)    2016   Past Surgical History:  Procedure Laterality Date  . PROSTATE BIOPSY     neg x2 per patient  . SHOULDER SURGERY     per Dr. Theda Sers, R shoulder   Family History  Problem Relation Age of Onset  . Dementia Mother   . Alcohol abuse Father   . Cancer Sister     possible breast cancer, pt was unsure  . Heart disease Brother   . Colon cancer Neg Hx   . Prostate cancer Neg Hx    Social History  Substance Use Topics  . Smoking status: Former Smoker    Packs/day: 0.25    Years: 4.00    Quit date: 10/05/1950  . Smokeless tobacco: Never Used  . Alcohol use 1.2 oz/week    2 Cans of beer per week     Comment: wine 2-3 times weekly       Review of Systems Per HPI    Objective:   Physical Exam  Constitutional: He is oriented to person, place, and time. He appears well-developed and well-nourished.  HENT:  Head: Normocephalic and atraumatic.  Eyes: Conjunctivae are normal.  Cardiovascular: Normal rate.   Pulmonary/Chest: Effort normal.  Musculoskeletal:       Left foot: There is tenderness and swelling.       Feet:  Neurological: He is alert and oriented to person, place, and time.  Skin: Skin is  warm and dry.  Psychiatric: He has a normal mood and affect. His behavior is normal. Judgment and thought content normal.  Vitals reviewed.     BP 134/80   Pulse 80   Temp 98 F (36.7 C) (Oral)   Wt 186 lb 4 oz (84.5 kg)   BMI 29.17 kg/m  Wt Readings from Last 3 Encounters:  09/11/16 186 lb 4 oz (84.5 kg)  05/20/16 179 lb (81.2 kg)  03/30/16 183 lb (83 kg)       Assessment & Plan:  1. Acute gout involving toe of left foot, unspecified cause - Uric acid - predniSONE (DELTASONE) 10 MG tablet; Take 4 tablets x 2 days, then 3 tablets x 2 days, then 2 tablets x 2 days then 1 tablet x 2 days  Dispense: 20 tablet; Refill: 0 - Provided written and verbal information regarding diagnosis and treatment. Discussed potential side effects from prednisone.   2. B12 deficiency - lab drawn prior to injection being given - Vitamin B12   Clarene Reamer, FNP-BC  [facility], [place]  09/11/2016 10:33  AM

## 2016-09-11 NOTE — Patient Instructions (Signed)
Prednisone tablets What is this medicine? PREDNISONE (PRED ni sone) is a corticosteroid. It is commonly used to treat inflammation of the skin, joints, lungs, and other organs. Common conditions treated include asthma, allergies, and arthritis. It is also used for other conditions, such as blood disorders and diseases of the adrenal glands. This medicine may be used for other purposes; ask your health care provider or pharmacist if you have questions. COMMON BRAND NAME(S): Deltasone, Predone, Sterapred, Sterapred DS What should I tell my health care provider before I take this medicine? They need to know if you have any of these conditions: -Cushing's syndrome -diabetes -glaucoma -heart disease -high blood pressure -infection (especially a virus infection such as chickenpox, cold sores, or herpes) -kidney disease -liver disease -mental illness -myasthenia gravis -osteoporosis -seizures -stomach or intestine problems -thyroid disease -an unusual or allergic reaction to lactose, prednisone, other medicines, foods, dyes, or preservatives -pregnant or trying to get pregnant -breast-feeding How should I use this medicine? Take this medicine by mouth with a glass of water. Follow the directions on the prescription label. Take this medicine with food. If you are taking this medicine once a day, take it in the morning. Do not take more medicine than you are told to take. Do not suddenly stop taking your medicine because you may develop a severe reaction. Your doctor will tell you how much medicine to take. If your doctor wants you to stop the medicine, the dose may be slowly lowered over time to avoid any side effects. Talk to your pediatrician regarding the use of this medicine in children. Special care may be needed. Overdosage: If you think you have taken too much of this medicine contact a poison control center or emergency room at once. NOTE: This medicine is only for you. Do not share this  medicine with others. What if I miss a dose? If you miss a dose, take it as soon as you can. If it is almost time for your next dose, talk to your doctor or health care professional. You may need to miss a dose or take an extra dose. Do not take double or extra doses without advice. What may interact with this medicine? Do not take this medicine with any of the following medications: -metyrapone -mifepristone This medicine may also interact with the following medications: -aminoglutethimide -amphotericin B -aspirin and aspirin-like medicines -barbiturates -certain medicines for diabetes, like glipizide or glyburide -cholestyramine -cholinesterase inhibitors -cyclosporine -digoxin -diuretics -ephedrine -male hormones, like estrogens and birth control pills -isoniazid -ketoconazole -NSAIDS, medicines for pain and inflammation, like ibuprofen or naproxen -phenytoin -rifampin -toxoids -vaccines -warfarin This list may not describe all possible interactions. Give your health care provider a list of all the medicines, herbs, non-prescription drugs, or dietary supplements you use. Also tell them if you smoke, drink alcohol, or use illegal drugs. Some items may interact with your medicine. What should I watch for while using this medicine? Visit your doctor or health care professional for regular checks on your progress. If you are taking this medicine over a prolonged period, carry an identification card with your name and address, the type and dose of your medicine, and your doctor's name and address. This medicine may increase your risk of getting an infection. Tell your doctor or health care professional if you are around anyone with measles or chickenpox, or if you develop sores or blisters that do not heal properly. If you are going to have surgery, tell your doctor or health care professional that  you have taken this medicine within the last twelve months. Ask your doctor or health  care professional about your diet. You may need to lower the amount of salt you eat. This medicine may affect blood sugar levels. If you have diabetes, check with your doctor or health care professional before you change your diet or the dose of your diabetic medicine. What side effects may I notice from receiving this medicine? Side effects that you should report to your doctor or health care professional as soon as possible: -allergic reactions like skin rash, itching or hives, swelling of the face, lips, or tongue -changes in emotions or moods -changes in vision -depressed mood -eye pain -fever or chills, cough, sore throat, pain or difficulty passing urine -increased thirst -swelling of ankles, feet Side effects that usually do not require medical attention (report to your doctor or health care professional if they continue or are bothersome): -confusion, excitement, restlessness -headache -nausea, vomiting -skin problems, acne, thin and shiny skin -trouble sleeping -weight gain This list may not describe all possible side effects. Call your doctor for medical advice about side effects. You may report side effects to FDA at 1-800-FDA-1088. Where should I keep my medicine? Keep out of the reach of children. Store at room temperature between 15 and 30 degrees C (59 and 86 degrees F). Protect from light. Keep container tightly closed. Throw away any unused medicine after the expiration date. NOTE: This sheet is a summary. It may not cover all possible information. If you have questions about this medicine, talk to your doctor, pharmacist, or health care provider.  2017 Elsevier/Gold Standard (2011-05-07 10:57:14) Gout Gout is painful swelling that can occur in some of your joints. Gout is a type of arthritis. This condition is caused by having too much uric acid in your body. Uric acid is a chemical that forms when your body breaks down substances called purines. Purines are important for  building body proteins. When your body has too much uric acid, sharp crystals can form and build up inside your joints. This causes pain and swelling. Gout attacks can happen quickly and be very painful (acute gout). Over time, the attacks can affect more joints and become more frequent (chronic gout). Gout can also cause uric acid to build up under your skin and inside your kidneys. What are the causes? This condition is caused by too much uric acid in your blood. This can occur because:  Your kidneys do not remove enough uric acid from your blood. This is the most common cause.  Your body makes too much uric acid. This can occur with some cancers and cancer treatments. It can also occur if your body is breaking down too many red blood cells (hemolytic anemia).  You eat too many foods that are high in purines. These foods include organ meats and some seafood. Alcohol, especially beer, is also high in purines. A gout attack may be triggered by trauma or stress. What increases the risk? This condition is more likely to develop in people who:  Have a family history of gout.  Are male and middle-aged.  Are male and have gone through menopause.  Are obese.  Frequently drink alcohol, especially beer.  Are dehydrated.  Lose weight too quickly.  Have an organ transplant.  Have lead poisoning.  Take certain medicines, including aspirin, cyclosporine, diuretics, levodopa, and niacin.  Have kidney disease or psoriasis. What are the signs or symptoms? An attack of acute gout happens quickly. It usually  occurs in just one joint. The most common place is the big toe. Attacks often start at night. Other joints that may be affected include joints of the feet, ankle, knee, fingers, wrist, or elbow. Symptoms may include:  Severe pain.  Warmth.  Swelling.  Stiffness.  Tenderness. The affected joint may be very painful to touch.  Shiny, red, or purple skin.  Chills and  fever. Chronic gout may cause symptoms more frequently. More joints may be involved. You may also have white or yellow lumps (tophi) on your hands or feet or in other areas near your joints. How is this diagnosed? This condition is diagnosed based on your symptoms, medical history, and physical exam. You may have tests, such as:  Blood tests to measure uric acid levels.  Removal of joint fluid with a needle (aspiration) to look for uric acid crystals.  X-rays to look for joint damage. How is this treated? Treatment for this condition has two phases: treating an acute attack and preventing future attacks. Acute gout treatment may include medicines to reduce pain and swelling, including:  NSAIDs.  Steroids. These are strong anti-inflammatory medicines that can be taken by mouth (orally) or injected into a joint.  Colchicine. This medicine relieves pain and swelling when it is taken soon after an attack. It can be given orally or through an IV tube. Preventive treatment may include:  Daily use of smaller doses of NSAIDs or colchicine.  Use of a medicine that reduces uric acid levels in your blood.  Changes to your diet. You may need to see a specialist about healthy eating (dietitian). Follow these instructions at home: During a Gout Attack  If directed, apply ice to the affected area:  Put ice in a plastic bag.  Place a towel between your skin and the bag.  Leave the ice on for 20 minutes, 2-3 times a day.  Rest the joint as much as possible. If the affected joint is in your leg, you may be given crutches to use.  Raise (elevate) the affected joint above the level of your heart as often as possible.  Drink enough fluids to keep your urine clear or pale yellow.  Take over-the-counter and prescription medicines only as told by your health care provider.  Do not drive or operate heavy machinery while taking prescription pain medicine.  Follow instructions from your health  care provider about eating or drinking restrictions.  Return to your normal activities as told by your health care provider. Ask your health care provider what activities are safe for you. Avoiding Future Gout Attacks  Follow a low-purine diet as told by your dietitian or health care provider. Avoid foods and drinks that are high in purines, including liver, kidney, anchovies, asparagus, herring, mushrooms, mussels, and beer.  Limit alcohol intake to no more than 1 drink a day for nonpregnant women and 2 drinks a day for men. One drink equals 12 oz of beer, 5 oz of wine, or 1 oz of hard liquor.  Maintain a healthy weight or lose weight if you are overweight. If you want to lose weight, talk with your health care provider. It is important that you do not lose weight too quickly.  Start or maintain an exercise program as told by your health care provider.  Drink enough fluids to keep your urine clear or pale yellow.  Take over-the-counter and prescription medicines only as told by your health care provider.  Keep all follow-up visits as told by your health  care provider. This is important. Contact a health care provider if:  You have another gout attack.  You continue to have symptoms of a gout attack after10 days of treatment.  You have side effects from your medicines.  You have chills or a fever.  You have burning pain when you urinate.  You have pain in your lower back or belly. Get help right away if:  You have severe or uncontrolled pain.  You cannot urinate. This information is not intended to replace advice given to you by your health care provider. Make sure you discuss any questions you have with your health care provider. Document Released: 09/18/2000 Document Revised: 02/27/2016 Document Reviewed: 07/04/2015 Elsevier Interactive Patient Education  2017 [employer].

## 2016-09-11 NOTE — Progress Notes (Signed)
Pre visit review using our clinic review tool, if applicable. No additional management support is needed unless otherwise documented below in the visit note. 

## 2016-09-13 ENCOUNTER — Other Ambulatory Visit: Payer: Self-pay | Admitting: Family Medicine

## 2016-09-13 DIAGNOSIS — E785 Hyperlipidemia, unspecified: Secondary | ICD-10-CM

## 2016-09-15 ENCOUNTER — Ambulatory Visit: Payer: Medicare Other

## 2016-09-15 ENCOUNTER — Other Ambulatory Visit: Payer: Medicare Other

## 2016-09-15 ENCOUNTER — Ambulatory Visit (INDEPENDENT_AMBULATORY_CARE_PROVIDER_SITE_OTHER): Payer: Medicare Other

## 2016-09-15 VITALS — BP 134/80 | HR 47 | Temp 97.5°F | Ht 67.0 in | Wt 186.5 lb

## 2016-09-15 DIAGNOSIS — Z23 Encounter for immunization: Secondary | ICD-10-CM

## 2016-09-15 DIAGNOSIS — Z0000 Encounter for general adult medical examination without abnormal findings: Secondary | ICD-10-CM

## 2016-09-15 DIAGNOSIS — E785 Hyperlipidemia, unspecified: Secondary | ICD-10-CM

## 2016-09-15 LAB — COMPREHENSIVE METABOLIC PANEL
ALK PHOS: 75 U/L (ref 39–117)
ALT: 45 U/L (ref 0–53)
AST: 34 U/L (ref 0–37)
Albumin: 4.1 g/dL (ref 3.5–5.2)
BILIRUBIN TOTAL: 0.5 mg/dL (ref 0.2–1.2)
BUN: 22 mg/dL (ref 6–23)
CALCIUM: 9.6 mg/dL (ref 8.4–10.5)
CO2: 26 meq/L (ref 19–32)
CREATININE: 1.42 mg/dL (ref 0.40–1.50)
Chloride: 107 mEq/L (ref 96–112)
GFR: 50.46 mL/min — AB (ref >60.00)
GLUCOSE: 178 mg/dL — AB (ref 70–99)
Potassium: 4.6 mEq/L (ref 3.5–5.1)
Sodium: 139 mEq/L (ref 135–145)
TOTAL PROTEIN: 7.6 g/dL (ref 6.0–8.3)

## 2016-09-15 LAB — LIPID PANEL
CHOL/HDL RATIO: 2
Cholesterol: 127 mg/dL (ref 0–200)
HDL: 64.8 mg/dL (ref >39.00)
LDL Cholesterol: 39 mg/dL (ref 0–99)
NONHDL: 62.43
TRIGLYCERIDES: 117 mg/dL (ref 0.0–149.0)
VLDL: 23.4 mg/dL (ref 0.0–40.0)

## 2016-09-15 NOTE — Progress Notes (Signed)
Subjective:   Justin Ford is a 82 y.o. male who presents for Medicare Annual/Subsequent preventive examination.  Review of Systems:  N/A Cardiac Risk Factors include: advanced age (>55men, >38 women);male gender;dyslipidemia;hypertension;sedentary lifestyle     Objective:    Vitals: BP 134/80 (BP Location: Left Arm, Patient Position: Sitting, Cuff Size: Normal)   Pulse (!) 47   Temp 97.5 F (36.4 C) (Oral)   Ht 5\' 7"  (1.702 m) Comment: no shoes  Wt 186 lb 8 oz (84.6 kg)   SpO2 97%   BMI 29.21 kg/m   Body mass index is 29.21 kg/m.  Tobacco History  Smoking Status  . Former Smoker  . Packs/day: 0.25  . Years: 4.00  . Quit date: 10/05/1950  Smokeless Tobacco  . Never Used     Counseling given: No   Past Medical History:  Diagnosis Date  . Barrett's esophagus   . Blood in stool    colonoscopy done ~2010  . Cancer ([place])    basal cell CA per derm  . GERD (gastroesophageal reflux disease)   . Hyperlipidemia   . Hypertension   . Kidney stones   . TIA (transient ischemic attack)    2016   Past Surgical History:  Procedure Laterality Date  . PROSTATE BIOPSY     neg x2 per patient  . SHOULDER SURGERY     per Dr. Theda Sers, R shoulder   Family History  Problem Relation Age of Onset  . Dementia Mother   . Alcohol abuse Father   . Heart disease Brother   . Cancer Sister     possible breast cancer, pt was unsure  . Colon cancer Neg Hx   . Prostate cancer Neg Hx    History  Sexual Activity  . Sexual activity: No    Outpatient Encounter Prescriptions as of 09/15/2016  Medication Sig  . acetaminophen (TYLENOL) 325 MG tablet Take 325-650 mg by mouth 3 (three) times daily as needed (for pain).  [name] aspirin 81 MG EC tablet Take 1 tablet (81 mg total) by mouth daily.  . clopidogrel (PLAVIX) 75 MG tablet Take 1 tablet (75 mg total) by mouth daily.  . cyanocobalamin (,VITAMIN B-12,) 1000 MCG/ML injection Inject 1 mL (1,000 mcg total) into the muscle as directed.  Weekly for 4 doses then monthly  . lamoTRIgine (LAMICTAL) 25 MG tablet Take 50 mg by mouth 2 (two) times daily.  [name] lisinopril (PRINIVIL,ZESTRIL) 20 MG tablet Take 1 tablet (20 mg total) by mouth daily.  . pantoprazole (PROTONIX) 40 MG tablet Take 1 tablet (40 mg total) by mouth daily.  . predniSONE (DELTASONE) 10 MG tablet Take 4 tablets x 2 days, then 3 tablets x 2 days, then 2 tablets x 2 days then 1 tablet x 2 days  . rosuvastatin (CRESTOR) 20 MG tablet Take 1 tablet (20 mg total) by mouth daily.   No facility-administered encounter medications on file as of 09/15/2016.     Activities of Daily Living In your present state of health, do you have any difficulty performing the following activities: 09/15/2016 09/23/2015  Hearing? Y N  Vision? N N  Difficulty concentrating or making decisions? Tempie Donning  Walking or climbing stairs? N N  Dressing or bathing? N N  Doing errands, shopping? N N  Preparing Food and eating ? N -  Using the Toilet? N -  In the past six months, have you accidently leaked urine? N -  Do you have problems with loss of bowel  control? N -  Managing your Medications? N -  Managing your Finances? N -  Housekeeping or managing your Housekeeping? N -  Some recent data might be hidden    Patient Care Team: [name], MD as PCP - General (Family Medicine) Vladimir Crofts, MD (Neurology) Ludwig Clarks, DO as Consulting Physician (Neurology) Kennieth Francois, MD as Consulting Physician (Dermatology) Estill Cotta, MD as Consulting Physician (Ophthalmology) Duard Brady as Consulting Physician (Dentistry)   Assessment:     Hearing Screening   125Hz  250Hz  500Hz  1000Hz  2000Hz  3000Hz  4000Hz  6000Hz  8000Hz   Right ear:   40 0 40  0    Left ear:   0 0 0  0    Vision Screening Comments: Last vision exam in July 2017 with Dr. Sandra Cockayne   Exercise Activities and Dietary recommendations Current Exercise Habits: The patient does not participate in regular exercise at  present, Exercise limited by: None identified  Goals    . Increase water intake          Starting 09/15/2016, I will continue to drink at least 5-6 glasses of water daily.       Fall Risk Fall Risk  09/15/2016 03/30/2016 08/08/2013  Falls in the past year? No No No   Depression Screen PHQ 2/9 Scores 09/15/2016 08/08/2013  PHQ - 2 Score 0 0    Cognitive Function MMSE - Mini Mental State Exam 09/15/2016  Orientation to time 5  Orientation to Place 5  Registration 3  Attention/ Calculation 0  Recall 1  Recall-comments pt was unable to recall 2 of 3 words  Language- name 2 objects 0  Language- repeat 1  Language- follow 3 step command 3  Language- read & follow direction 0  Write a sentence 0  Copy design 0  Total score 18       PLEASE NOTE: A Mini-Cog screen was completed. Maximum score is 20. A value of 0 denotes this part of Folstein MMSE was not completed or the patient failed this part of the Mini-Cog screening.   Mini-Cog Screening Orientation to Time - Max 5 pts Orientation to Place - Max 5 pts Registration - Max 3 pts Recall - Max 3 pts Language Repeat - Max 1 pts Language Follow 3 Step Command - Max 3 pts   Immunization History  Administered Date(s) Administered  . DT 03/15/2012  . Influenza, High Dose Seasonal PF 06/14/2014  . Influenza,inj,Quad PF,36+ Mos 08/08/2013, 06/27/2015, 07/14/2016  . Influenza-Unspecified 07/06/2011  . Pneumococcal Conjugate-13 08/23/2014  . Pneumococcal Polysaccharide-23 09/15/2016  . Zoster 06/14/2012   Screening Tests Health Maintenance  Topic Date Due  . TETANUS/TDAP  03/15/2022  . INFLUENZA VACCINE  Completed  . ZOSTAVAX  Completed  . PNA vac Low Risk Adult  Completed      Plan:     I have personally reviewed and addressed the Medicare Annual Wellness questionnaire and have noted the following in the patient's chart:  A. Medical and social history B. Use of alcohol, tobacco or illicit drugs  C. Current  medications and supplements D. Functional ability and status E.  Nutritional status F.  Physical activity G. Advance directives H. List of other physicians I.  Hospitalizations, surgeries, and ER visits in previous 12 months J.  [place] to include hearing, vision, cognitive, depression L. Referrals and appointments - none  In addition, I have reviewed and discussed with patient certain preventive protocols, quality metrics, and best practice recommendations. A written personalized  care plan for preventive services as well as general preventive health recommendations were provided to patient.  See attached scanned questionnaire for additional information.   Signed,   Lindell Noe, MHA, BS, LPN Health Coach

## 2016-09-15 NOTE — Patient Instructions (Signed)
Justin Ford , Thank you for taking time to come for your Medicare Wellness Visit. I appreciate your ongoing commitment to your health goals. Please review the following plan we discussed and let me know if I can assist you in the future.   These are the goals we discussed: Goals    . Increase water intake          Starting 09/15/2016, I will continue to drink at least 5-6 glasses of water daily.        This is a list of the screening recommended for you and due dates:  Health Maintenance  Topic Date Due  . Tetanus Vaccine  03/15/2022  . Flu Shot  Completed  . Shingles Vaccine  Completed  . Pneumonia vaccines  Completed   Preventive Care for Adults  A healthy lifestyle and preventive care can promote health and wellness. Preventive health guidelines for adults include the following key practices.  . A routine yearly physical is a good way to check with your health care provider about your health and preventive screening. It is a chance to share any concerns and updates on your health and to receive a thorough exam.  . Visit your dentist for a routine exam and preventive care every 6 months. Brush your teeth twice a day and floss once a day. Good oral hygiene prevents tooth decay and gum disease.  . The frequency of eye exams is based on your age, health, family medical history, use  of contact lenses, and other factors. Follow your health care provider's ecommendations for frequency of eye exams.  . Eat a healthy diet. Foods like vegetables, fruits, whole grains, low-fat dairy products, and lean protein foods contain the nutrients you need without too many calories. Decrease your intake of foods high in solid fats, added sugars, and salt. Eat the right amount of calories for you. Get information about a proper diet from your health care provider, if necessary.  . Regular physical exercise is one of the most important things you can do for your health. Most adults should get at least  150 minutes of moderate-intensity exercise (any activity that increases your heart rate and causes you to sweat) each week. In addition, most adults need muscle-strengthening exercises on 2 or more days a week.  Silver Sneakers may be a benefit available to you. To determine eligibility, you may visit the website: www.silversneakers.com or contact program at (949)225-2989 Mon-Fri between 8AM-8PM.   . Maintain a healthy weight. The body mass index (BMI) is a screening tool to identify possible weight problems. It provides an estimate of body fat based on height and weight. Your health care provider can find your BMI and can help you achieve or maintain a healthy weight.   For adults 20 years and older: ? A BMI below 18.5 is considered underweight. ? A BMI of 18.5 to 24.9 is normal. ? A BMI of 25 to 29.9 is considered overweight. ? A BMI of 30 and above is considered obese.   . Maintain normal blood lipids and cholesterol levels by exercising and minimizing your intake of saturated fat. Eat a balanced diet with plenty of fruit and vegetables. Blood tests for lipids and cholesterol should begin at age 69 and be repeated every 5 years. If your lipid or cholesterol levels are high, you are over 50, or you are at high risk for heart disease, you may need your cholesterol levels checked more frequently. Ongoing high lipid and cholesterol levels should be  treated with medicines if diet and exercise are not working.  . If you smoke, find out from your health care provider how to quit. If you do not use tobacco, please do not start.  . If you choose to drink alcohol, please do not consume more than 2 drinks per day. One drink is considered to be 12 ounces (355 mL) of beer, 5 ounces (148 mL) of wine, or 1.5 ounces (44 mL) of liquor.  . If you are 23-47 years old, ask your health care provider if you should take aspirin to prevent strokes.  . Use sunscreen. Apply sunscreen liberally and repeatedly  throughout the day. You should seek shade when your shadow is shorter than you. Protect yourself by wearing long sleeves, pants, a wide-brimmed hat, and sunglasses year round, whenever you are outdoors.  . Once a month, do a whole body skin exam, using a mirror to look at the skin on your back. Tell your health care provider of new moles, moles that have irregular borders, moles that are larger than a pencil eraser, or moles that have changed in shape or color.

## 2016-09-15 NOTE — Progress Notes (Signed)
Pre visit review using our clinic review tool, if applicable. No additional management support is needed unless otherwise documented below in the visit note. 

## 2016-09-15 NOTE — Progress Notes (Signed)
PCP notes:   Health maintenance:  PPSV23 - administered  Abnormal screenings:   Hearing - failed Mini-Cog score: 18/20  Patient concerns:   None  Nurse concerns:  None  Next PCP appt:   09/21/16 @ 1130  I reviewed health advisor's note, was available for consultation on the day of service listed in this note, and agree with documentation and plan. Elsie Stain, MD.

## 2016-09-21 ENCOUNTER — Ambulatory Visit: Payer: Medicare Other | Admitting: Family Medicine

## 2016-09-22 ENCOUNTER — Telehealth: Payer: Self-pay | Admitting: Family Medicine

## 2016-09-22 NOTE — Telephone Encounter (Signed)
Needs f/u when possible.  Thanks.

## 2016-09-22 NOTE — Telephone Encounter (Signed)
Patient did not come in for their appointment on 09/21/16 for medicare wellness  Please let me know if patient needs to be contacted immediately for follow up or no follow up needed.

## 2016-09-24 NOTE — Telephone Encounter (Signed)
Pt declined to schedule part 2 wellness exam. Pt doesn't believe in them.

## 2016-09-28 NOTE — Telephone Encounter (Addendum)
Please clarify this with the patient about the point of the visit.  You can explain this to the patient as delicately as needed, but it doesn't really matter what he believes. He has ongoing chronic medical problems that are going to need routine follow-up. I am not going to refill any of his medicines at any point unless he is willing to have follow-up regarding his ongoing chronic medical problems. That's what part two the Medicare wellness exam is. He either comes in for it or he doesn't, but it doesn't really matter what we/he call the visit or wether anyone believes in it.   And he's due to come in anyway for routine discussion of labs, especially with sugar of 178 recently.  Needs 30 min OV.  Thanks.

## 2016-09-29 NOTE — Telephone Encounter (Signed)
Left message on patient's voicemail to return call

## 2016-09-29 NOTE — Telephone Encounter (Signed)
Patient advised that a 30 minute OV was needed to discuss his labs, especially since sugar was 178.  Appointment scheduled.

## 2016-10-09 ENCOUNTER — Encounter: Payer: Self-pay | Admitting: Family Medicine

## 2016-10-09 ENCOUNTER — Ambulatory Visit (INDEPENDENT_AMBULATORY_CARE_PROVIDER_SITE_OTHER): Payer: Medicare Other | Admitting: Family Medicine

## 2016-10-09 VITALS — BP 114/64 | HR 61 | Temp 97.6°F | Ht 67.5 in | Wt 181.5 lb

## 2016-10-09 DIAGNOSIS — I10 Essential (primary) hypertension: Secondary | ICD-10-CM | POA: Diagnosis not present

## 2016-10-09 DIAGNOSIS — E785 Hyperlipidemia, unspecified: Secondary | ICD-10-CM | POA: Diagnosis not present

## 2016-10-09 DIAGNOSIS — M109 Gout, unspecified: Secondary | ICD-10-CM

## 2016-10-09 DIAGNOSIS — R739 Hyperglycemia, unspecified: Secondary | ICD-10-CM

## 2016-10-09 DIAGNOSIS — E538 Deficiency of other specified B group vitamins: Secondary | ICD-10-CM

## 2016-10-09 DIAGNOSIS — R05 Cough: Secondary | ICD-10-CM

## 2016-10-09 DIAGNOSIS — R059 Cough, unspecified: Secondary | ICD-10-CM

## 2016-10-09 DIAGNOSIS — K2270 Barrett's esophagus without dysplasia: Secondary | ICD-10-CM

## 2016-10-09 LAB — HEMOGLOBIN A1C: Hgb A1c MFr Bld: 5.7 % (ref 4.6–6.5)

## 2016-10-09 MED ORDER — LORATADINE 10 MG PO TABS: 10.0000 mg | ORAL_TABLET | Freq: Every day | ORAL | Status: DC

## 2016-10-09 MED ORDER — ROSUVASTATIN CALCIUM 20 MG PO TABS: 20.0000 mg | ORAL_TABLET | Freq: Every day | ORAL | 3 refills | Status: DC

## 2016-10-09 MED ORDER — CLOPIDOGREL BISULFATE 75 MG PO TABS: 75.0000 mg | ORAL_TABLET | Freq: Every day | ORAL | 3 refills | Status: DC

## 2016-10-09 MED ORDER — PANTOPRAZOLE SODIUM 40 MG PO TBEC: 40.0000 mg | DELAYED_RELEASE_TABLET | Freq: Every day | ORAL | 3 refills | Status: DC

## 2016-10-09 NOTE — Progress Notes (Signed)
Pre visit review using our clinic review tool, if applicable. No additional management support is needed unless otherwise documented below in the visit note. 

## 2016-10-09 NOTE — Patient Instructions (Addendum)
Go to the lab on the way out.  We'll contact you with your lab report. Add on claritin 10mg  a day.  See if that helps the cough.  If you continue to cough then we'll need to recheck you.  Keep taking monthly B12 shots.  Take care.  Glad to see you.

## 2016-10-09 NOTE — Progress Notes (Signed)
Hypertension:    Using medication without problems or lightheadedness: yes Chest pain with exertion:no Edema:no Short of breath:no Labs d/w pt.    Elevated Cholesterol: Using medications without problems:yes Muscle aches: noe Diet compliance: encouraged, d/w pt.  Exercise: limited, d/w pt.    Cough noted, for a few months.  Tickle in his throat.  No fevers, no chills.  Some sputum, not bloody.  Some occ rhinorrhea.   Hyperglycemia.  Labs d/w pt.  Due for A1c.  D/w pt.  Unclear if related to prev prednisone use.    B12 def. Monthly shots now, labs d/w pt.    H/o barrett's.  On PPI.  No ADE on med.    Gout.  Single prev flare, resolved with prednisone but with diarrhea on the medicine.  dw pt about tapering etoh, which he is doing.    PMH and SH reviewed  ROS: Per HPI unless specifically indicated in ROS section   Meds, vitals, and allergies reviewed.   GEN: nad, alert and oriented HEENT: mucous membranes moist, OP wnl, nasal exam with some clear discharge.  NECK: supple w/o LA CV: rrr.  no murmur PULM: ctab, no inc wob ABD: soft, +bs EXT: no edema SKIN: no acute rash L 1st MTP not inflamed or tender.

## 2016-10-11 DIAGNOSIS — M109 Gout, unspecified: Secondary | ICD-10-CM | POA: Insufficient documentation

## 2016-10-11 NOTE — Assessment & Plan Note (Signed)
Could be related postnasal drip. He is cleared and follow-up as needed. He agrees.Lungs are clear.

## 2016-10-11 NOTE — Assessment & Plan Note (Signed)
Controlled. Continue as is.

## 2016-10-11 NOTE — Assessment & Plan Note (Addendum)
No residual symptoms. He will update me if he has more symptoms. Gout diet given to patient. >25 minutes spent in face to face time with patient, >50% spent in counselling or coordination of care

## 2016-10-11 NOTE — Assessment & Plan Note (Signed)
Labs discussed with patient. Continue work on diet and exercise. No change in medicines.

## 2016-10-11 NOTE — Assessment & Plan Note (Signed)
See notes on labs. A1c pending.

## 2016-10-11 NOTE — Assessment & Plan Note (Signed)
Continue PPI ?

## 2016-10-11 NOTE — Assessment & Plan Note (Signed)
Improved with replacement. Labs discussed with patient. Continue as is.

## 2016-10-14 ENCOUNTER — Telehealth: Payer: Self-pay

## 2016-10-14 NOTE — Telephone Encounter (Signed)
Pt was seen 10/09/16 and wanted to know if Dr Damita Dunnings wanted pt to take loratadine and pantoprazole. I advised pt per office note that pt was to take the loratadine for cough. Pt asked if that was the OTC med and I advised yes. I also told pt Dr Damita Dunnings refilled the pantoprazole while pt was here on 10/09/16. Pt said that was all he needed.

## 2016-10-20 ENCOUNTER — Ambulatory Visit (INDEPENDENT_AMBULATORY_CARE_PROVIDER_SITE_OTHER): Payer: Medicare Other | Admitting: *Deleted

## 2016-10-20 DIAGNOSIS — E538 Deficiency of other specified B group vitamins: Secondary | ICD-10-CM | POA: Diagnosis not present

## 2016-10-20 MED ORDER — CYANOCOBALAMIN 1000 MCG/ML IJ SOLN
1000.0000 ug | Freq: Once | INTRAMUSCULAR | Status: AC
  Administered 2016-10-20: 1000 ug via INTRAMUSCULAR

## 2016-10-30 ENCOUNTER — Ambulatory Visit (INDEPENDENT_AMBULATORY_CARE_PROVIDER_SITE_OTHER): Payer: Medicare Other | Admitting: Family Medicine

## 2016-10-30 ENCOUNTER — Telehealth: Payer: Self-pay

## 2016-10-30 ENCOUNTER — Encounter: Payer: Self-pay | Admitting: Family Medicine

## 2016-10-30 ENCOUNTER — Ambulatory Visit
Admission: RE | Admit: 2016-10-30 | Discharge: 2016-10-30 | Disposition: A | Discharge status: Home / Self Care | Payer: Medicare Other | Source: Ambulatory Visit | Attending: Family Medicine | Admitting: Family Medicine

## 2016-10-30 ENCOUNTER — Telehealth: Payer: Self-pay | Admitting: Family Medicine

## 2016-10-30 VITALS — BP 116/80 | HR 58 | Temp 97.8°F | Ht 67.5 in | Wt 183.0 lb

## 2016-10-30 DIAGNOSIS — R2242 Localized swelling, mass and lump, left lower limb: Secondary | ICD-10-CM | POA: Diagnosis not present

## 2016-10-30 DIAGNOSIS — M660 Rupture of popliteal cyst: Secondary | ICD-10-CM

## 2016-10-30 DIAGNOSIS — M7989 Other specified soft tissue disorders: Secondary | ICD-10-CM | POA: Diagnosis not present

## 2016-10-30 NOTE — Telephone Encounter (Addendum)
Butch Penny at ultrasound called report for lower leg. There is a correction to report the popliteal fossa  Measuring 2.3 x 13x1.0 cm. Butch Penny said that would be corrected in report. Report in epic and taken to Dr Diona Browner. Pt is waiting. Dr Diona Browner will cb to [phone]. Pt will wait there for cb. Butch Penny said while waiting on me she asked Dr Barbie Banner about the measurement and Dr Barbie Banner said the 2.3 x 1.3x 4.0 cm was correct.

## 2016-10-30 NOTE — Patient Instructions (Signed)
Stop at front desk to set up [place] of left leg.  In meantime, elevated leg when sitting.

## 2016-10-30 NOTE — Telephone Encounter (Signed)
Spoke with pt about likely rupture baker's cyst.  Use tylenol extra strength every 6 hours as needed for pain.  Elevate and rest.  I will refer him to ortho for follow up.  Cell [phone]

## 2016-10-30 NOTE — Telephone Encounter (Signed)
Pt has appt with Dr Diona Browner on 10/30/16 at 11 AM.

## 2016-10-30 NOTE — Assessment & Plan Note (Addendum)
No clear sign of gout at thie point.. No redness no joint pain, no warmth.  Given inactivity... Worrisome for DVT. Refer for ASAP [place].   Addendum: no DVT of [place].  Shows large 13 cm collection of fluid in calf.. likely ruptured baker cyst. Will treat with  Rest, elevation and analgesics.. Will refer to [facility] for re-eval next week.

## 2016-10-30 NOTE — Addendum Note (Signed)
Addended byEliezer Lofts E on: 10/30/2016 11:53 AM   Modules accepted: Orders

## 2016-10-30 NOTE — Progress Notes (Signed)
   Subjective:    Patient ID: Justin Ford, male    DOB: 1930-08-17, 83 y.o.   MRN: [id number]  HPI   83 year old male pt of Dr. Damita Dunnings with history of  CVA on plavix, HTN, high cholesterol presents with new onset left leg swelling in  Last 15 days.  No known injury, no fall.   Hx of gout. Last flare 09/11/2016. Treated with prednisone taper.  uric acid 7.5 Gave him SE.  Resolved 90%.[name] Until now.  Now with pain in calf, swelling in left lower leg and foot.  No redness, no pain in toe.  He is not very active.  No SOB, No chest pain.   Review of Systems  Constitutional: Negative for fatigue.  HENT: Negative for ear pain.   Eyes: Negative for pain.  Respiratory: Negative for shortness of breath.   Cardiovascular: Positive for leg swelling. Negative for chest pain and palpitations.       Objective:   Physical Exam  Constitutional: Vital signs are normal. He appears well-developed and well-nourished.  HENT:  Head: Normocephalic.  Right Ear: Hearing normal.  Left Ear: Hearing normal.  Nose: Nose normal.  Mouth/Throat: Oropharynx is clear and moist and mucous membranes are normal.  Neck: Trachea normal. Carotid bruit is not present. No thyroid mass and no thyromegaly present.  Cardiovascular: Normal rate, regular rhythm and normal pulses.  Exam reveals no gallop, no distant heart sounds and no friction rub.   No murmur heard. Left leg pitting edema up to knee, bruising at ankle bilaterally  Positive Homan's sign.  no redness, no ttp at 1st MCP joint  no ttp at ankle  Pulmonary/Chest: Effort normal and breath sounds normal. No respiratory distress.  Musculoskeletal:       Left knee: Normal. He exhibits normal range of motion. No tenderness found. No medial joint line, no lateral joint line, no MCL and no LCL tenderness noted.       Left ankle: He exhibits normal range of motion. No tenderness. No lateral malleolus and no medial malleolus tenderness found. Achilles tendon  normal.  Skin: Skin is warm, dry and intact. No rash noted.  Psychiatric: He has a normal mood and affect. His speech is normal and behavior is normal. Thought content normal.          Assessment & Plan:

## 2016-10-30 NOTE — Telephone Encounter (Signed)
Patient Name: Justin Ford  DOB: [birth year]    Initial Comment Caller says, had gout a month ago, now left calf is swelling 20% larger than the other one. Foot is also pooling in foot, hard to walk. Needs an appt today.    Nurse Assessment  Nurse: Raphael Gibney, RN, Vanita Ingles Date/Time (Eastern Time): 10/30/2016 9:04:49 AM  Confirm and document reason for call. If symptomatic, describe symptoms. ---Caller states he had gout recently. His left calf and foot is more swollen than his right foot and leg Has a little bit of blood on his left foot. Painful to walk.  Does the patient have any new or worsening symptoms? ---Yes  Will a triage be completed? ---Yes  Related visit to physician within the last 2 weeks? ---No  Does the PT have any chronic conditions? (i.e. diabetes, asthma, etc.) ---Yes  List chronic conditions. ---gout  Is this a behavioral health or substance abuse call? ---No     Guidelines    Guideline Title Affirmed Question Affirmed Notes  Leg Swelling and Edema [1] Thigh or calf pain AND [2] only 1 side AND [3] present > 1 hour    Final Disposition User   See Physician within 4 Hours (or PCP triage) Raphael Gibney, RN, Vera    Comments  No appts available within 4 hrs at [facility]. Pt does not want to go to urgent care. Please call pt back regarding appt. States he will need at least 30 min to get there as he is at the nursing home with his wife.   Referrals  GO TO FACILITY REFUSED   Disagree/Comply: Comply

## 2016-10-30 NOTE — Progress Notes (Signed)
Pre visit review using our clinic review tool, if applicable. No additional management support is needed unless otherwise documented below in the visit note. 

## 2016-10-30 NOTE — Addendum Note (Signed)
Addended by: Eliezer Lofts E on: 10/30/2016 04:18 PM   Modules accepted: Orders

## 2016-11-01 NOTE — Telephone Encounter (Signed)
I thank all involved.  I didn't put in any orders, since the referral is already in the EMR.

## 2016-11-03 DIAGNOSIS — M660 Rupture of popliteal cyst: Secondary | ICD-10-CM | POA: Diagnosis not present

## 2016-11-10 ENCOUNTER — Telehealth: Payer: Self-pay | Admitting: Family Medicine

## 2016-11-10 NOTE — Telephone Encounter (Signed)
Noted. Thanks.

## 2016-11-10 NOTE — Telephone Encounter (Signed)
Spoke with Justin Ford and advised we had received a letter from [facility] about Plavix being recalled.  He states he also received notification in the mail and has already taken his medication to [facility].  They verified that he did not have the recalled Lot # [id number].

## 2016-11-10 NOTE — Telephone Encounter (Signed)
Pt called and stated he is returning your call  Please call [phone] [facility]

## 2016-11-24 ENCOUNTER — Ambulatory Visit: Payer: Medicare Other

## 2016-11-25 ENCOUNTER — Ambulatory Visit (INDEPENDENT_AMBULATORY_CARE_PROVIDER_SITE_OTHER): Payer: Medicare Other

## 2016-11-25 DIAGNOSIS — E538 Deficiency of other specified B group vitamins: Secondary | ICD-10-CM | POA: Diagnosis not present

## 2016-11-25 MED ORDER — CYANOCOBALAMIN 1000 MCG/ML IJ SOLN
1000.0000 ug | Freq: Once | INTRAMUSCULAR | Status: AC
  Administered 2016-11-25: 1000 ug via INTRAMUSCULAR

## 2016-11-30 NOTE — Progress Notes (Signed)
Justin Ford was seen today in the movement disorders clinic for neurologic consultation at the request of Elsie Stain, MD.  The consultation is for the evaluation of tremor.  Tremor started 1-2 years ago.  It is worse when something of weight is in the hand and he notes it when the L hand is resting on the steering wheel.  It is only in the L hand.  He is R hand dominant.     Specific Symptoms:  Tremor: Yes.      Affected by caffeine:  No. (2 lg cups coffee per day)  Affected by alcohol:  No. (3-4 glasses per week)  Affected by stress:  No.  Affected by fatigue:  No.  Spills soup if on spoon:  No. (no trouble because using R hand)  Spills glass of liquid if full:  No.  Affects ADL's (tying shoes, brushing teeth, etc):  No.  Family hx of similar:  Yes.   ("thinks" that his sister may have had PD as shook later in life) Voice: no change Sleep: no issue  Vivid Dreams:  No.  Acting out dreams:  No. Wet Pillows: No. Postural symptoms:  Yes.   - "I don't walk with the same stride and not as steady as I used to be"  Falls?  No. Bradykinesia symptoms: slow movements Loss of smell:  No. Loss of taste:  No. Urinary Incontinence:  No. (noturia x 2) Difficulty Swallowing:  No. Handwriting, micrographia: No., but has trouble writing as tapers off some at the end of word Depression:  No. Memory changes:  Yes.   (states that he doesn't remember as good as should but overall okay) Hallucinations:  No.  visual distortions: No. N/V:  No. Lightheaded:  No.  Syncope: No. Diplopia:  No.   12/01/16 update:  The patient follows up today in regards to tremor.  The patient states that the tremor has gotten a little worse since our last visit.  He is not spilling things.  He is R hand dominant but only notes the tremor in the L hand.   He is scheduled to see Dr. Manuella Ghazi tomorrow for seizure for which he is on Lamictal but states that he accidentally showed up there today and cannot go tomorrow, so  had to reschedule the appointment.  He has had no further spells.  I reviewed Dr. Janyth Pupa records.  He is getting B12 injections montly from Dr. Damita Dunnings.  His last injection was on 11/25/16.     Neuroimaging has previously been performed.  It is available for my review today.  It was done on 09/23/15.  It had severe WMD and mild atrophy.  PREVIOUS MEDICATIONS: none to date  ALLERGIES:   Allergies  Allergen Reactions  . Lipitor [Atorvastatin] Other (See Comments)    Reaction:  Unknown     CURRENT MEDICATIONS:  Outpatient Encounter Prescriptions as of 12/01/2016  Medication Sig  . acetaminophen (TYLENOL) 325 MG tablet Take 325-650 mg by mouth 3 (three) times daily as needed (for pain).  [name] aspirin 81 MG EC tablet Take 1 tablet (81 mg total) by mouth daily.  . clopidogrel (PLAVIX) 75 MG tablet Take 1 tablet (75 mg total) by mouth daily.  . cyanocobalamin (,VITAMIN B-12,) 1000 MCG/ML injection Inject 1 mL (1,000 mcg total) into the muscle as directed. Weekly for 4 doses then monthly  . lamoTRIgine (LAMICTAL) 25 MG tablet Take 50 mg by mouth 2 (two) times daily.  [name] lisinopril (PRINIVIL,ZESTRIL) 20 MG  tablet Take 1 tablet (20 mg total) by mouth daily.  [name] loratadine (CLARITIN) 10 MG tablet Take 1 tablet (10 mg total) by mouth daily.  . pantoprazole (PROTONIX) 40 MG tablet Take 1 tablet (40 mg total) by mouth daily.  . rosuvastatin (CRESTOR) 20 MG tablet Take 1 tablet (20 mg total) by mouth daily.   No facility-administered encounter medications on file as of 12/01/2016.     PAST MEDICAL HISTORY:   Past Medical History:  Diagnosis Date  . Barrett's esophagus   . Blood in stool    colonoscopy done ~2010  . Cancer ([place])    basal cell CA per derm  . GERD (gastroesophageal reflux disease)   . Hyperlipidemia   . Hypertension   . Kidney stones   . TIA (transient ischemic attack)    2016    PAST SURGICAL HISTORY:   Past Surgical History:  Procedure Laterality Date  . PROSTATE BIOPSY      neg x2 per patient  . SHOULDER SURGERY     per Dr. Theda Sers, R shoulder    SOCIAL HISTORY:   Social History   Social History  . Marital status: Married    Spouse name: N/A  . Number of children: N/A  . Years of education: N/A   Occupational History  . retired     Optometrist   Social History Main Topics  . Smoking status: Former Smoker    Packs/day: 0.25    Years: 4.00    Quit date: 10/05/1950  . Smokeless tobacco: Never Used  . Alcohol use 1.2 oz/week    2 Cans of beer per week     Comment: wine 2-3 times weekly  . Drug use: No  . Sexual activity: No   Other Topics Concern  . Not on file   Social History Narrative   Married 1951   From [place].    Retired Optometrist   2 kids, 2 grandkids (grandkids are Radiation protection practitioner)    FAMILY HISTORY:   Family Status  Relation Status  . Mother Deceased   dementia  . Father Deceased at age 33   ETOH  . Sister Deceased   3, lung issues (2), PD (possibly)  . Brother Alive   MI  . Sister Alive   muscle issues  . Sister   . Neg Hx     ROS:  A complete 10 system review of systems was obtained and was unremarkable apart from what is mentioned above.  PHYSICAL EXAMINATION:    VITALS:   Vitals:   12/01/16 1439  BP: 132/72  Pulse: 60  SpO2: 97%  Weight: 178 lb (80.7 kg)  Height: 5' 7.5" (1.715 m)    GEN:  The patient appears stated age and is in NAD. HEENT:  Normocephalic, atraumatic.  The mucous membranes are moist. The superficial temporal arteries are without ropiness or tenderness. CV:  RRR Lungs:  CTAB Neck/HEME:  There are no carotid bruits bilaterally.  Neurological examination:  Orientation: The patient is alert and oriented x3. Fund of knowledge is appropriate.  Recent and remote memory are intact.  Attention and concentration are normal.    Able to name objects and repeat phrases. Cranial nerves: There is good facial symmetry.  Extraocular muscles are intact. The visual fields are  full to confrontational testing. The speech is fluent and clear. Soft palate rises symmetrically and there is no tongue deviation. Hearing is intact to conversational tone. Sensation: Sensation is intact to  light and pinprick throughout (facial, trunk, extremities). Vibration is mildly decreased at the bilateral big toe. There is no extinction with double simultaneous stimulation. There is no sensory dermatomal level identified. Motor: Strength is 5/5 in the bilateral upper and lower extremities.   Shoulder shrug is equal and symmetric.  There is no pronator drift.  Movement examination: Tone: There is normal tone in the bilateral upper extremities.  The tone in the lower extremities is normal.  Abnormal movements: There is mild resting tremor of the L thumb.  There is postural tremor on the R with outstretched hands. He has no trouble pouring full glass of water from one glass to another.  He does develop some tremor when given a weight and asked to hold arm straight out.  In wing beating position, much less tremor.  He brings in a bag filled with soup cans and when he holds that he has tremor on the L.  When he has a glass of water in the L hand there is tremor (actually brought in his own glass to show me) Coordination:  There is good RAM's today Gait and Station: The patient has no difficulty arising out of a deep-seated chair without the use of the hands. The patient's stride length is normal with good arm swing.   LABS  Lab Results  Component Value Date   TSH 1.61 03/06/2016     Chemistry      Component Value Date/Time   NA 139 09/15/2016 1110   K 4.6 09/15/2016 1110   K 4.2 08/02/2014 1605   CL 107 09/15/2016 1110   CO2 26 09/15/2016 1110   BUN 22 09/15/2016 1110   CREATININE 1.42 09/15/2016 1110   CREATININE 1.51 (H) 03/06/2016 1601      Component Value Date/Time   CALCIUM 9.6 09/15/2016 1110   ALKPHOS 75 09/15/2016 1110   AST 34 09/15/2016 1110   ALT 45 09/15/2016 1110    BILITOT 0.5 09/15/2016 1110     Lab Results  Component Value Date   VITAMINB12 354 09/11/2016     ASSESSMENT/PLAN:  1.  Tremor, likely essential in nature.  -He has asymmetric essential tremor.  At this point, I do not see features of idiopathic Parkinson's disease and does not meet the criteria for this.  I asked him if he wanted any medication, as his tremor can be at least moderate when he hold a weight in the hand, but he did not want medication.  We talked about surgical therapy and he was not interested in this either.  I told him that he really did not need to different neurologists to follow him, and he could continue just to follow with Dr. Manuella Ghazi, whom he sees for seizure.  However, the patient stated that he would prefer to continue to follow here for the tremor, which is okay with me.  2.  B12 deficiency  -would like to see over 400.  Was only 150 in June and started injections.  Was 354 in December and that was a trough level.  Suspect is higher during other times of month.  3.  Follow up is anticipated in the next 6-8 months, sooner should new neurologic issues arise.    Total face to face time:  20 min

## 2016-12-01 ENCOUNTER — Ambulatory Visit: Payer: Medicare Other | Admitting: Neurology

## 2016-12-01 ENCOUNTER — Ambulatory Visit (INDEPENDENT_AMBULATORY_CARE_PROVIDER_SITE_OTHER): Payer: Medicare Other | Admitting: Neurology

## 2016-12-01 ENCOUNTER — Encounter: Payer: Self-pay | Admitting: Neurology

## 2016-12-01 VITALS — BP 132/72 | HR 60 | Ht 67.5 in | Wt 178.0 lb

## 2016-12-01 DIAGNOSIS — G250 Essential tremor: Secondary | ICD-10-CM | POA: Diagnosis not present

## 2016-12-01 DIAGNOSIS — E538 Deficiency of other specified B group vitamins: Secondary | ICD-10-CM | POA: Diagnosis not present

## 2016-12-04 ENCOUNTER — Telehealth: Payer: Self-pay | Admitting: Family Medicine

## 2016-12-04 DIAGNOSIS — E538 Deficiency of other specified B group vitamins: Secondary | ICD-10-CM

## 2016-12-04 NOTE — Telephone Encounter (Signed)
Pt would like to know how many b12 injections he need to have. Pt request cb

## 2016-12-06 NOTE — Telephone Encounter (Signed)
Monthly. Recheck B12 level prior to injection to get a trough level in May or June of this year.  Ordered lab.

## 2016-12-07 NOTE — Telephone Encounter (Signed)
Patient notified as instructed by telephone and verbalized understanding. Patient stated that he already has an appointment scheduled in March for B12 injection.

## 2016-12-11 DIAGNOSIS — L814 Other melanin hyperpigmentation: Secondary | ICD-10-CM | POA: Diagnosis not present

## 2016-12-11 DIAGNOSIS — X32XXXA Exposure to sunlight, initial encounter: Secondary | ICD-10-CM | POA: Diagnosis not present

## 2016-12-11 DIAGNOSIS — D225 Melanocytic nevi of trunk: Secondary | ICD-10-CM | POA: Diagnosis not present

## 2016-12-11 DIAGNOSIS — Z85828 Personal history of other malignant neoplasm of skin: Secondary | ICD-10-CM | POA: Diagnosis not present

## 2016-12-24 ENCOUNTER — Ambulatory Visit (INDEPENDENT_AMBULATORY_CARE_PROVIDER_SITE_OTHER): Payer: Medicare Other

## 2016-12-24 DIAGNOSIS — E538 Deficiency of other specified B group vitamins: Secondary | ICD-10-CM | POA: Diagnosis not present

## 2016-12-24 MED ORDER — CYANOCOBALAMIN 1000 MCG/ML IJ SOLN
1000.0000 ug | Freq: Once | INTRAMUSCULAR | Status: AC
  Administered 2016-12-24: 1000 ug via INTRAMUSCULAR

## 2017-02-26 ENCOUNTER — Ambulatory Visit (INDEPENDENT_AMBULATORY_CARE_PROVIDER_SITE_OTHER): Payer: Medicare Other | Admitting: Family Medicine

## 2017-02-26 ENCOUNTER — Encounter: Payer: Self-pay | Admitting: Family Medicine

## 2017-02-26 DIAGNOSIS — M21371 Foot drop, right foot: Secondary | ICD-10-CM | POA: Diagnosis not present

## 2017-02-26 NOTE — Patient Instructions (Signed)
We'll notify Dr. Manuella Ghazi with neuro about your right foot drop.  If you have any new symptoms in the meantime, then let us know.  Take care.  Glad to see you.

## 2017-02-26 NOTE — Progress Notes (Signed)
Wife broke her leg and he is caring for her.  He has been losing weight in the meantime, with the strain of caring for her.  D/w pt.  He is monitoring his weight in the meantime.  He isn't lightheaded, d/w pt about cautions re: BP with weight loss.    R foot issue.  He doesn't have a specific injury but in the last few months his forefoot will drop down after his heel hits but not before.  He has started to have a foot drop that drags the toes, but that is mild.   No pain with the issue.  No falls.    He occ sits R leg crossed.  Only on the R foot- no L foot or B hand sx.  No leg weakness o/w.  No back pain.  No trauma.    PMH and SH reviewed  ROS: Per HPI unless specifically indicated in ROS section   Meds, vitals, and allergies reviewed.   GEN: nad, alert and oriented HEENT: mucous membranes moist NECK: supple w/o LA CV: rrr. PULM: ctab, no inc wob ABD: soft, +bs EXT: no edema CN 2-12 wnl B, S/S wnl except for R foot drop and weak dorsiflexion with normal plantar flexion.  Normal strength in B hip flexion and L foot ROM.

## 2017-02-28 DIAGNOSIS — M21371 Foot drop, right foot: Secondary | ICD-10-CM | POA: Insufficient documentation

## 2017-02-28 NOTE — Assessment & Plan Note (Addendum)
D/w pt about possible causes.  Advised him not to cross his legs, in case he has any peripheral compression contributing.  I need neurology input.  ddx d/w pt.  No sign of CVA at this point and given the timeline doesn't need urgent brain imaging.  Okay for outpatient f/u.  He has no back pain, so I didn't order MRI L spine.  >25 minutes spent in face to face time with patient, >50% spent in counselling or coordination of care.

## 2017-03-10 ENCOUNTER — Telehealth: Payer: Self-pay | Admitting: *Deleted

## 2017-03-10 NOTE — Telephone Encounter (Signed)
Spoke to pt who states he was recently seen for a foot drop. He was advised "not to do anything" until he heard from Dr Damita Dunnings or Dr Trena Platt office. He has not received a call from either and is wanting to know how he should proceed. Pls advise

## 2017-03-10 NOTE — Telephone Encounter (Signed)
I sent the note to Dr. Manuella Ghazi- please call the neuro clinic and get them to call patient about f/u (or I can talk with Dr. Manuella Ghazi if needed).  Thanks.

## 2017-03-11 NOTE — Telephone Encounter (Signed)
Dr. Trena Platt office says they have no record of a note but will phone the patient for an appointment.

## 2017-03-17 ENCOUNTER — Ambulatory Visit (INDEPENDENT_AMBULATORY_CARE_PROVIDER_SITE_OTHER): Payer: Medicare Other

## 2017-03-17 DIAGNOSIS — E538 Deficiency of other specified B group vitamins: Secondary | ICD-10-CM | POA: Diagnosis not present

## 2017-03-17 MED ORDER — CYANOCOBALAMIN 1000 MCG/ML IJ SOLN
1000.0000 ug | Freq: Once | INTRAMUSCULAR | Status: AC
  Administered 2017-03-17: 1000 ug via INTRAMUSCULAR

## 2017-03-26 DIAGNOSIS — E538 Deficiency of other specified B group vitamins: Secondary | ICD-10-CM | POA: Diagnosis not present

## 2017-03-26 DIAGNOSIS — R251 Tremor, unspecified: Secondary | ICD-10-CM | POA: Diagnosis not present

## 2017-03-26 DIAGNOSIS — R569 Unspecified convulsions: Secondary | ICD-10-CM | POA: Diagnosis not present

## 2017-03-26 DIAGNOSIS — R200 Anesthesia of skin: Secondary | ICD-10-CM | POA: Diagnosis not present

## 2017-03-26 DIAGNOSIS — M21371 Foot drop, right foot: Secondary | ICD-10-CM | POA: Diagnosis not present

## 2017-03-30 ENCOUNTER — Telehealth: Payer: Self-pay

## 2017-03-30 ENCOUNTER — Telehealth: Payer: Self-pay | Admitting: Neurology

## 2017-03-30 NOTE — Telephone Encounter (Signed)
Pt request information from Dr Tat to be sent to Dr Manuella Ghazi; transferred pt to Dr Doristine Devoid office. Pt voiced understanding.

## 2017-03-30 NOTE — Telephone Encounter (Signed)
Caller: Justin Ford  Urgent? No  Reason for the call: He called asking could his last 2 office notes from where he was seen by Dr. Carles Collet be sent to Dr. Manuella Ghazi at [facility] ([place] Thanks

## 2017-03-30 NOTE — Telephone Encounter (Signed)
They have access to our notes in the system but I also sent his last two office notes to Dr. Manuella Ghazi.

## 2017-03-31 DIAGNOSIS — M21371 Foot drop, right foot: Secondary | ICD-10-CM | POA: Diagnosis not present

## 2017-04-02 DIAGNOSIS — M21371 Foot drop, right foot: Secondary | ICD-10-CM | POA: Diagnosis not present

## 2017-04-06 ENCOUNTER — Other Ambulatory Visit: Payer: Self-pay | Admitting: Neurology

## 2017-04-06 DIAGNOSIS — M21371 Foot drop, right foot: Secondary | ICD-10-CM

## 2017-04-06 DIAGNOSIS — R200 Anesthesia of skin: Secondary | ICD-10-CM

## 2017-04-09 DIAGNOSIS — M21371 Foot drop, right foot: Secondary | ICD-10-CM | POA: Diagnosis not present

## 2017-04-12 DIAGNOSIS — M21371 Foot drop, right foot: Secondary | ICD-10-CM | POA: Diagnosis not present

## 2017-04-13 ENCOUNTER — Ambulatory Visit
Admission: RE | Admit: 2017-04-13 | Discharge: 2017-04-13 | Disposition: A | Discharge status: Home / Self Care | Payer: Medicare Other | Source: Ambulatory Visit | Attending: Neurology | Admitting: Neurology

## 2017-04-13 DIAGNOSIS — R200 Anesthesia of skin: Secondary | ICD-10-CM

## 2017-04-13 DIAGNOSIS — M21371 Foot drop, right foot: Secondary | ICD-10-CM | POA: Insufficient documentation

## 2017-04-13 DIAGNOSIS — I6782 Cerebral ischemia: Secondary | ICD-10-CM | POA: Diagnosis not present

## 2017-04-16 DIAGNOSIS — M21371 Foot drop, right foot: Secondary | ICD-10-CM | POA: Diagnosis not present

## 2017-04-20 ENCOUNTER — Ambulatory Visit (INDEPENDENT_AMBULATORY_CARE_PROVIDER_SITE_OTHER): Payer: Medicare Other

## 2017-04-20 DIAGNOSIS — E538 Deficiency of other specified B group vitamins: Secondary | ICD-10-CM

## 2017-04-20 MED ORDER — CYANOCOBALAMIN 1000 MCG/ML IJ SOLN
1000.0000 ug | Freq: Once | INTRAMUSCULAR | Status: AC
  Administered 2017-04-20: 1000 ug via INTRAMUSCULAR

## 2017-04-21 DIAGNOSIS — M21371 Foot drop, right foot: Secondary | ICD-10-CM | POA: Diagnosis not present

## 2017-04-23 ENCOUNTER — Telehealth: Payer: Self-pay

## 2017-04-23 DIAGNOSIS — M21371 Foot drop, right foot: Secondary | ICD-10-CM | POA: Diagnosis not present

## 2017-04-23 NOTE — Telephone Encounter (Signed)
Pt called and said he was returning call from this number Mentioned Dr Manuella Ghazi, gave him [facility] number

## 2017-04-28 DIAGNOSIS — M21371 Foot drop, right foot: Secondary | ICD-10-CM | POA: Diagnosis not present

## 2017-05-05 DIAGNOSIS — M21371 Foot drop, right foot: Secondary | ICD-10-CM | POA: Diagnosis not present

## 2017-05-07 DIAGNOSIS — M21371 Foot drop, right foot: Secondary | ICD-10-CM | POA: Diagnosis not present

## 2017-05-10 DIAGNOSIS — M21371 Foot drop, right foot: Secondary | ICD-10-CM | POA: Diagnosis not present

## 2017-05-12 DIAGNOSIS — G5731 Lesion of lateral popliteal nerve, right lower limb: Secondary | ICD-10-CM | POA: Diagnosis not present

## 2017-05-12 DIAGNOSIS — G608 Other hereditary and idiopathic neuropathies: Secondary | ICD-10-CM | POA: Diagnosis not present

## 2017-05-13 DIAGNOSIS — M21371 Foot drop, right foot: Secondary | ICD-10-CM | POA: Diagnosis not present

## 2017-05-19 DIAGNOSIS — M21371 Foot drop, right foot: Secondary | ICD-10-CM | POA: Diagnosis not present

## 2017-05-21 DIAGNOSIS — M21371 Foot drop, right foot: Secondary | ICD-10-CM | POA: Diagnosis not present

## 2017-05-25 ENCOUNTER — Ambulatory Visit (INDEPENDENT_AMBULATORY_CARE_PROVIDER_SITE_OTHER): Payer: Medicare Other

## 2017-05-25 ENCOUNTER — Ambulatory Visit: Payer: Medicare Other

## 2017-05-25 DIAGNOSIS — E538 Deficiency of other specified B group vitamins: Secondary | ICD-10-CM | POA: Diagnosis not present

## 2017-05-25 MED ORDER — CYANOCOBALAMIN 1000 MCG/ML IJ SOLN
1000.0000 ug | Freq: Once | INTRAMUSCULAR | Status: AC
  Administered 2017-05-25: 1000 ug via INTRAMUSCULAR

## 2017-06-15 DIAGNOSIS — M21371 Foot drop, right foot: Secondary | ICD-10-CM | POA: Diagnosis not present

## 2017-06-29 ENCOUNTER — Ambulatory Visit: Payer: Medicare Other

## 2017-06-29 DIAGNOSIS — R251 Tremor, unspecified: Secondary | ICD-10-CM | POA: Diagnosis not present

## 2017-06-29 DIAGNOSIS — G5731 Lesion of lateral popliteal nerve, right lower limb: Secondary | ICD-10-CM | POA: Diagnosis not present

## 2017-06-29 DIAGNOSIS — G608 Other hereditary and idiopathic neuropathies: Secondary | ICD-10-CM | POA: Diagnosis not present

## 2017-06-29 DIAGNOSIS — R569 Unspecified convulsions: Secondary | ICD-10-CM | POA: Diagnosis not present

## 2017-06-30 ENCOUNTER — Ambulatory Visit (INDEPENDENT_AMBULATORY_CARE_PROVIDER_SITE_OTHER): Payer: Medicare Other | Admitting: *Deleted

## 2017-06-30 DIAGNOSIS — E538 Deficiency of other specified B group vitamins: Secondary | ICD-10-CM

## 2017-06-30 DIAGNOSIS — Z23 Encounter for immunization: Secondary | ICD-10-CM

## 2017-06-30 MED ORDER — CYANOCOBALAMIN 1000 MCG/ML IJ SOLN
1000.0000 ug | Freq: Once | INTRAMUSCULAR | Status: AC
  Administered 2017-06-30: 1000 ug via INTRAMUSCULAR

## 2017-07-02 DIAGNOSIS — M21371 Foot drop, right foot: Secondary | ICD-10-CM | POA: Diagnosis not present

## 2017-07-05 ENCOUNTER — Encounter: Payer: Self-pay | Admitting: Internal Medicine

## 2017-07-05 ENCOUNTER — Telehealth: Payer: Self-pay | Admitting: Family Medicine

## 2017-07-05 ENCOUNTER — Ambulatory Visit (INDEPENDENT_AMBULATORY_CARE_PROVIDER_SITE_OTHER): Payer: Medicare Other | Admitting: Internal Medicine

## 2017-07-05 VITALS — BP 118/76 | HR 53 | Temp 97.7°F | Wt 165.5 lb

## 2017-07-05 DIAGNOSIS — R0789 Other chest pain: Secondary | ICD-10-CM | POA: Diagnosis not present

## 2017-07-05 DIAGNOSIS — S2020XA Contusion of thorax, unspecified, initial encounter: Secondary | ICD-10-CM | POA: Diagnosis not present

## 2017-07-05 NOTE — Telephone Encounter (Signed)
[facility] Patient Name: Justin Ford DOB: Jul 21, 1930 Initial Comment Caller States the patient is having bruising and chest pain. (thinks it is anxiety related) Nurse Assessment Nurse: Markus Daft, RN, Windy Date/Time (Eastern Time): 07/05/2017 8:43:07 AM Confirm and document reason for call. If symptomatic, describe symptoms. ---Caller states he is feeling anxious, concerned about bruise. Denies CP or SOB. c/o chest pain 2 wks ago. Bruises on his back, not painful, but it is large - about 10% of back on one side. -- h/ o falling 3-4 times d/t drop foot, and last time was 2 wks ago (he did not go to office). Does the patient have any new or worsening symptoms? ---Yes Will a triage be completed? ---Yes Related visit to physician within the last 2 weeks? ---No Does the PT have any chronic conditions? (i.e. diabetes, asthma, etc.) ---Yes List chronic conditions. ---falling, drop foot, HTN, on blood thinners - TIA Is this a behavioral health or substance abuse call? ---No Guidelines Guideline Title Affirmed Question Affirmed Notes Bruises Taking Coumadin (warfarin) or other strong blood thinner, or known bleeding disorder (e.g., thrombocytopenia) Final Disposition User See Physician within 4 Hours (or PCP triage) Markus Daft, RN, Windy Comments Appt made for 07/05/17 at 10 am for [facility] as there are no appts available with Dr. Damita Dunnings. RN upgraded pt to be seen within 4 hrs due to large flat bruise on his back. Referrals REFERRED TO PCP OFFICE Caller Disagree/Comply Comply Caller Understands Yes PreDisposition Go to Urgent Care/Walk-In Clinic

## 2017-07-05 NOTE — Telephone Encounter (Signed)
Pt has appt 07/05/17 at 10 Am with Avie Echevaria NP.

## 2017-07-05 NOTE — Progress Notes (Signed)
Subjective:    Patient ID: Justin Ford, male    DOB: 30-May-1930, 83 y.o.   MRN: [id number]  HPI  Pt presents to the clinic today with c/o a bruise to the right side of his back. He reports his wife noticed this 1-2 weeks ago. He denies any pain in the area of the bruise. He reports he did fall 2 weeks ago, but he fell forward and did not recall hitting the right side of his back. He denies s/s of bleeding elsewhere. He does take Plavix daily, history of TIA.  He also c/o chest tightness. He reports this occurred out of the blue about 2 weeks ago. He did not have any associated dizziness, shortness of breath or sweating. He reports the chest pressure only occurred for a few seconds and he has not had any issues since that time. He has been under a lot of stress, he is trying to sell his home, and move in with his daughter. He has never had any issues with chest pain. He has GERD, but well controlled with Protonix. He has a history of HTN and HLD, last ECG 2016.  Review of Systems      Past Medical History:  Diagnosis Date  . Barrett's esophagus   . Blood in stool    colonoscopy done ~2010  . Cancer ([place])    basal cell CA per derm  . GERD (gastroesophageal reflux disease)   . Hyperlipidemia   . Hypertension   . Kidney stones   . TIA (transient ischemic attack)    2016    Current Outpatient Prescriptions  Medication Sig Dispense Refill  . acetaminophen (TYLENOL) 325 MG tablet Take 325-650 mg by mouth 3 (three) times daily as needed (for pain).    [name] aspirin 81 MG EC tablet Take 1 tablet (81 mg total) by mouth daily.    . clopidogrel (PLAVIX) 75 MG tablet Take 1 tablet (75 mg total) by mouth daily. 90 tablet 3  . cyanocobalamin (,VITAMIN B-12,) 1000 MCG/ML injection Inject 1 mL (1,000 mcg total) into the muscle as directed. Weekly for 4 doses then monthly 1 mL 0  . lamoTRIgine (LAMICTAL) 100 MG tablet Take 1 tablet by mouth daily.    [name] lisinopril (PRINIVIL,ZESTRIL) 20 MG tablet  Take 1 tablet (20 mg total) by mouth daily. 90 tablet 3  . loratadine (CLARITIN) 10 MG tablet Take 1 tablet (10 mg total) by mouth daily.    . pantoprazole (PROTONIX) 40 MG tablet Take 1 tablet (40 mg total) by mouth daily. 90 tablet 3  . rosuvastatin (CRESTOR) 20 MG tablet Take 1 tablet (20 mg total) by mouth daily. 90 tablet 3   No current facility-administered medications for this visit.     Allergies  Allergen Reactions  . Lipitor [Atorvastatin] Other (See Comments)    Reaction:  Unknown     Family History  Problem Relation Age of Onset  . Dementia Mother   . Alcohol abuse Father   . Heart disease Brother   . Cancer Sister        possible breast cancer, pt was unsure  . Colon cancer Neg Hx   . Prostate cancer Neg Hx     Social History   Social History  . Marital status: Married    Spouse name: N/A  . Number of children: N/A  . Years of education: N/A   Occupational History  . retired     Optometrist   Social History Main Topics  .  Smoking status: Former Smoker    Packs/day: 0.25    Years: 4.00    Quit date: 10/05/1950  . Smokeless tobacco: Never Used  . Alcohol use 1.2 oz/week    2 Cans of beer per week     Comment: wine 2-3 times weekly  . Drug use: No  . Sexual activity: No   Other Topics Concern  . Not on file   Social History Narrative   Married 1951   From [place].    Retired Optometrist   2 kids, 2 grandkids (grandkids are local)     Constitutional: Denies fever, malaise, fatigue, headache or abrupt weight changes.  Respiratory: Denies difficulty breathing, shortness of breath, cough or sputum production.   Cardiovascular: Pt reports chest tightness. Denies chest pain, palpitations or swelling in the hands or feet.  Skin: Pt report bruise of back. Denies redness, rashes, lesions or ulcercations.  Psych: Pt reports stress. Denies anxiety, depression, SI/HI.  No other specific complaints in a complete review of  systems (except as listed in HPI above).  Objective:   Physical Exam   BP 118/76   Pulse (!) 53   Temp 97.7 F (36.5 C) (Oral)   Wt 165 lb 8 oz (75.1 kg)   SpO2 97%   BMI 25.54 kg/m  Wt Readings from Last 3 Encounters:  07/05/17 165 lb 8 oz (75.1 kg)  02/26/17 161 lb 12 oz (73.4 kg)  12/01/16 178 lb (80.7 kg)    General: Appears his stated age, in NAD. Skin: Warm, dry and intact. Large dark purple bruise noted of right parathoracic region. Cardiovascular: Normal rate and rhythm. Occasional ectopic beat noted.  No murmur, rubs or gallops noted.  Pulmonary/Chest: Normal effort and positive vesicular breath sounds. No respiratory distress. No wheezes, rales or ronchi noted.  Neurological: Alert and oriented. Seems a bit confused at times, repeating himself.   BMET    Component Value Date/Time   NA 139 09/15/2016 1110   K 4.6 09/15/2016 1110   K 4.2 08/02/2014 1605   CL 107 09/15/2016 1110   CO2 26 09/15/2016 1110   GLUCOSE 178 (H) 09/15/2016 1110   BUN 22 09/15/2016 1110   CREATININE 1.42 09/15/2016 1110   CREATININE 1.51 (H) 03/06/2016 1601   CALCIUM 9.6 09/15/2016 1110   GFRNONAA 40 (L) 09/24/2015 0517   GFRAA 46 (L) 09/24/2015 0517    Lipid Panel     Component Value Date/Time   CHOL 127 09/15/2016 1110   TRIG 117.0 09/15/2016 1110   HDL 64.80 09/15/2016 1110   CHOLHDL 2 09/15/2016 1110   VLDL 23.4 09/15/2016 1110   LDLCALC 39 09/15/2016 1110    CBC    Component Value Date/Time   WBC 5.6 05/20/2016 1921   RBC 4.57 05/20/2016 1921   HGB 14.2 05/20/2016 1921   HCT 41.8 05/20/2016 1921   PLT 171.0 05/20/2016 1921   MCV 91.4 05/20/2016 1921   MCH 30.8 03/06/2016 1601   MCHC 34.1 05/20/2016 1921   RDW 14.0 05/20/2016 1921   LYMPHSABS 1.4 05/20/2016 1921   MONOABS 0.4 05/20/2016 1921   EOSABS 0.2 05/20/2016 1921   BASOSABS 0.1 05/20/2016 1921    Hgb A1C Lab Results  Component Value Date   HGBA1C 5.7 10/09/2016           Assessment & Plan:     Bruise of Back:  Self limiting Advised him his body would absorb this will time Discussed the benefits  vs the risk of the Plavix and we both agree he should continue  Chest Tightness:  Likely stress related ECG today shows right bundle branch block, unchanged from prior No further intervention at this time  Return precautions discussed Webb Silversmith, NP

## 2017-07-05 NOTE — Patient Instructions (Signed)
Contusion A contusion is a deep bruise. Contusions happen when an injury causes bleeding under the skin. Symptoms of bruising include pain, swelling, and discolored skin. The skin may turn blue, purple, or yellow. Follow these instructions at home:  Rest the injured area.  If told, put ice on the injured area. ? Put ice in a plastic bag. ? Place a towel between your skin and the bag. ? Leave the ice on for 20 minutes, 2-3 times per day.  If told, put light pressure (compression) on the injured area using an elastic bandage. Make sure the bandage is not too tight. Remove it and put it back on as told by your doctor.  If possible, raise (elevate) the injured area above the level of your heart while you are sitting or lying down.  Take over-the-counter and prescription medicines only as told by your doctor. Contact a doctor if:  Your symptoms do not get better after several days of treatment.  Your symptoms get worse.  You have trouble moving the injured area. Get help right away if:  You have very bad pain.  You have a loss of feeling (numbness) in a hand or foot.  Your hand or foot turns pale or cold. This information is not intended to replace advice given to you by your health care provider. Make sure you discuss any questions you have with your health care provider. Document Released: 03/09/2008 Document Revised: 02/27/2016 Document Reviewed: 02/06/2015 Elsevier Interactive Patient Education  2018 [employer]  

## 2017-07-06 ENCOUNTER — Ambulatory Visit: Payer: Self-pay | Admitting: Primary Care

## 2017-07-07 DIAGNOSIS — M21371 Foot drop, right foot: Secondary | ICD-10-CM | POA: Diagnosis not present

## 2017-07-27 DIAGNOSIS — D369 Benign neoplasm, unspecified site: Secondary | ICD-10-CM | POA: Diagnosis not present

## 2017-07-27 DIAGNOSIS — R208 Other disturbances of skin sensation: Secondary | ICD-10-CM | POA: Diagnosis not present

## 2017-07-27 DIAGNOSIS — D225 Melanocytic nevi of trunk: Secondary | ICD-10-CM | POA: Diagnosis not present

## 2017-07-27 DIAGNOSIS — L821 Other seborrheic keratosis: Secondary | ICD-10-CM | POA: Diagnosis not present

## 2017-08-03 ENCOUNTER — Ambulatory Visit (INDEPENDENT_AMBULATORY_CARE_PROVIDER_SITE_OTHER): Payer: Medicare Other

## 2017-08-03 DIAGNOSIS — E538 Deficiency of other specified B group vitamins: Secondary | ICD-10-CM | POA: Diagnosis not present

## 2017-08-03 MED ORDER — CYANOCOBALAMIN 1000 MCG/ML IJ SOLN
1000.0000 ug | Freq: Once | INTRAMUSCULAR | Status: AC
  Administered 2017-08-03: 1000 ug via INTRAMUSCULAR

## 2017-08-20 IMAGING — CT CT HEAD W/O CM
1 series · 16 of 30 positions shown, 20 images · non-contrast
Comparison: 06/21/2015 as well as MRI 06/22/2015.

CLINICAL DATA: Slurred speech and confusion beginning yesterday.
Some improvement today. History of recent stroke 2 months ago.

EXAM:
CT HEAD WITHOUT CONTRAST
TECHNIQUE: Contiguous axial images were obtained from the base of the skull
through the vertex without intravenous contrast.

[Series 2: head wo · axial · 0.42mm/px · z∈[-115,+29]mm · 16 of 36 slices shown, 20 images]
[im 2/36  brain]
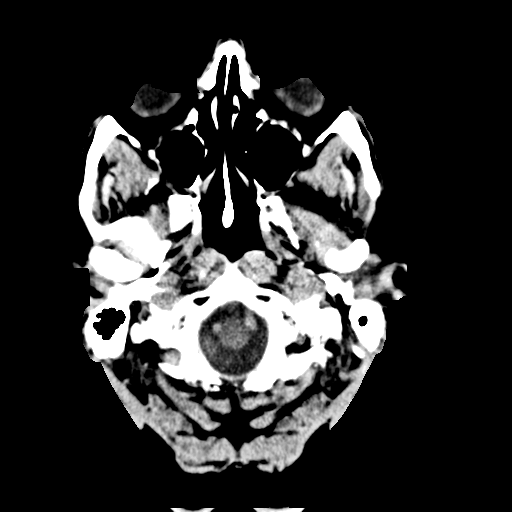
[im 2/36  bone]
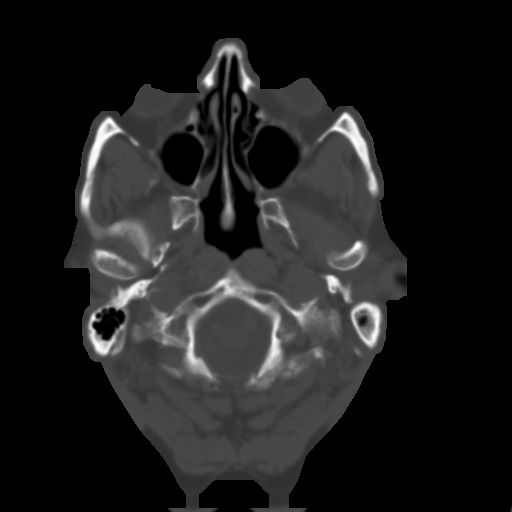
[im 4/36  brain]
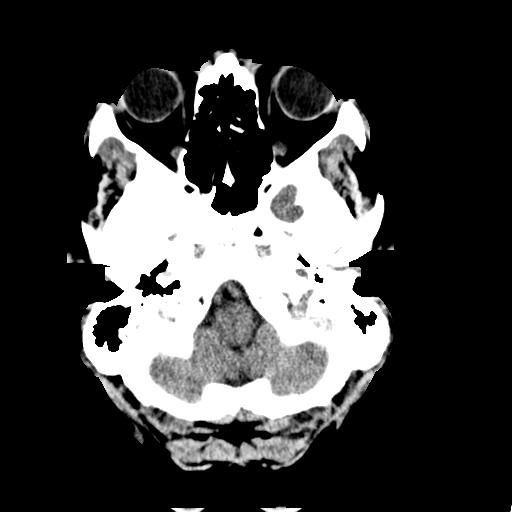
[im 7/36  brain]
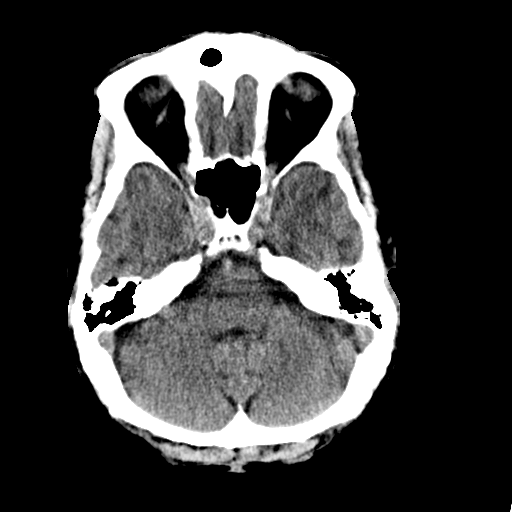
[im 9/36  brain]
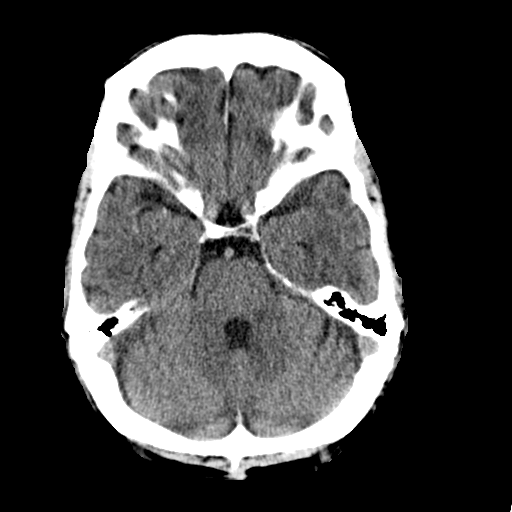
[im 10/36  brain]
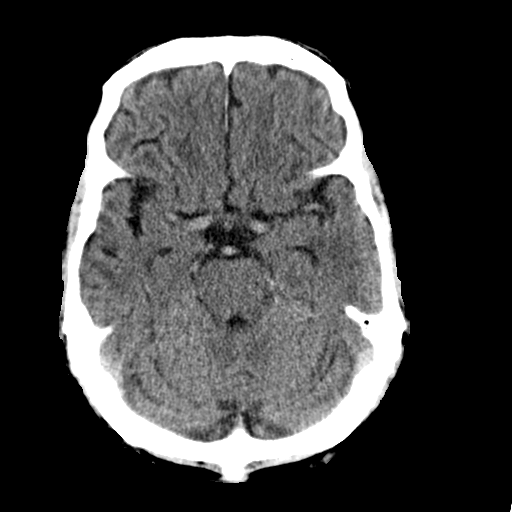
[im 10/36  bone]
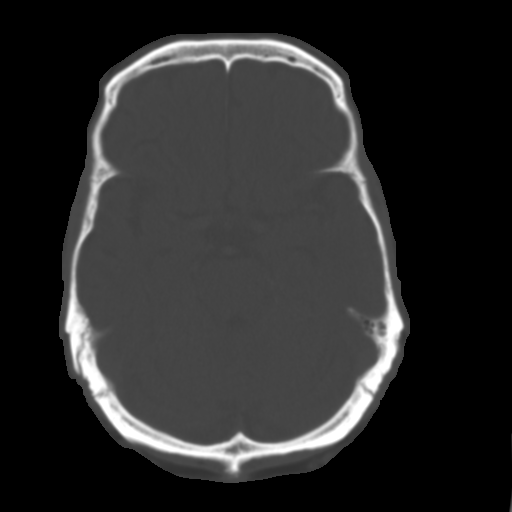
[im 13/36  brain]
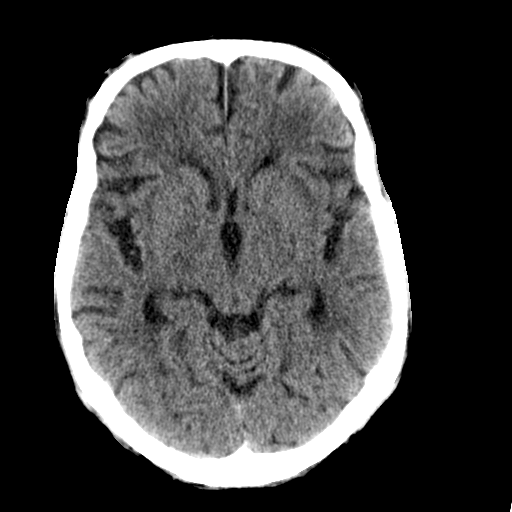
[im 15/36  brain]
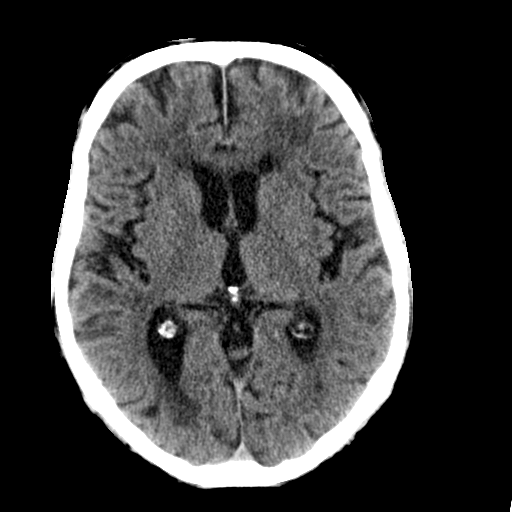
[im 17/36  brain]
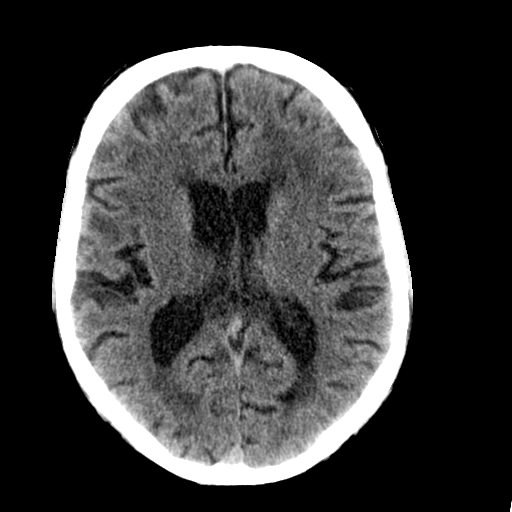
[im 19/36  brain]
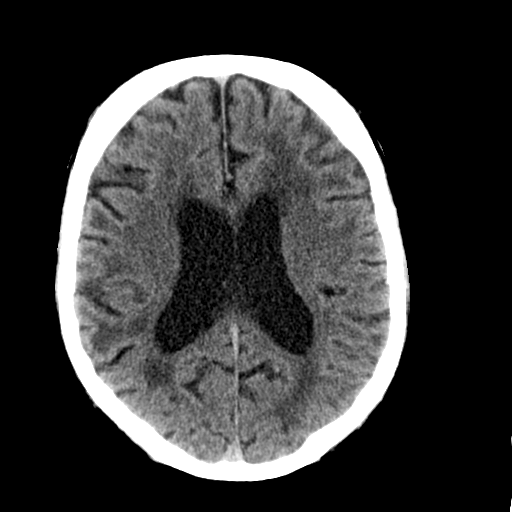
[im 19/36  bone]
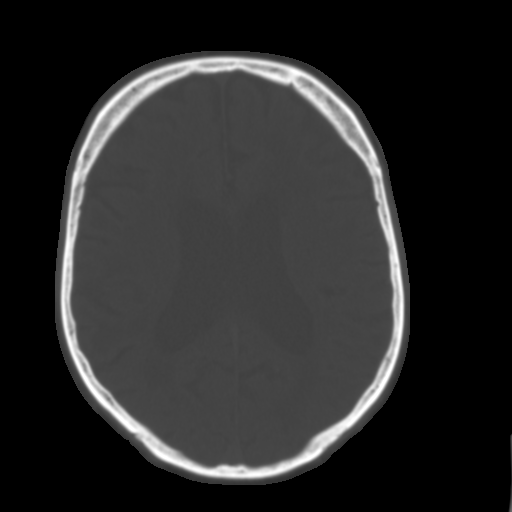
[im 21/36  brain]
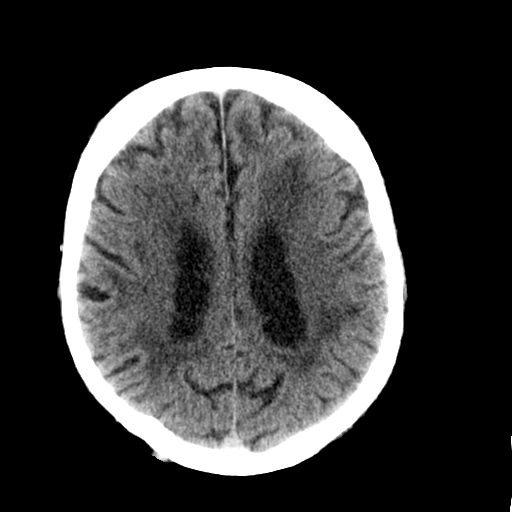
[im 23/36  brain]
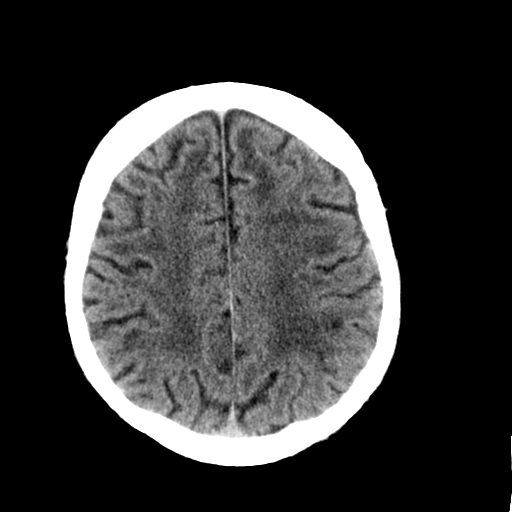
[im 26/36  brain]
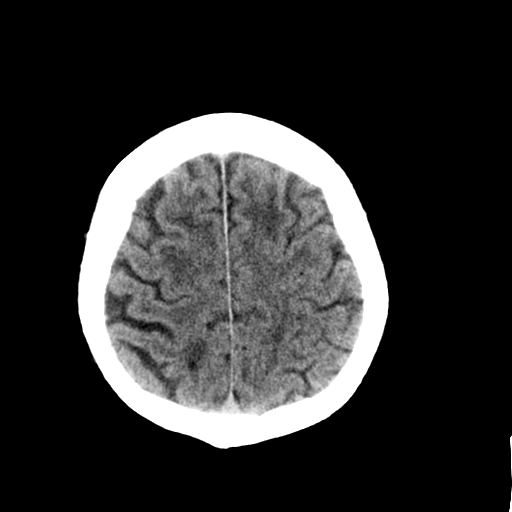
[im 27/36  brain]
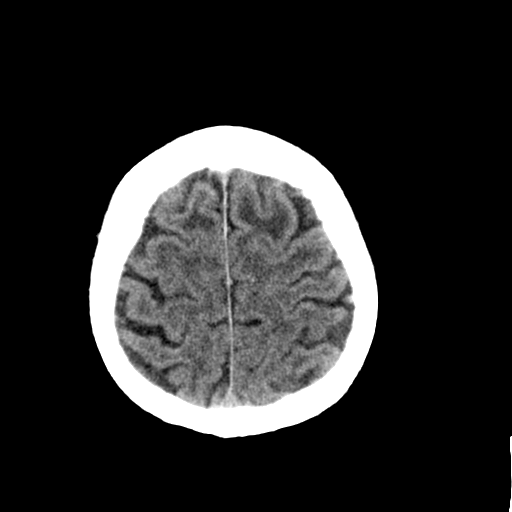
[im 27/36  bone]
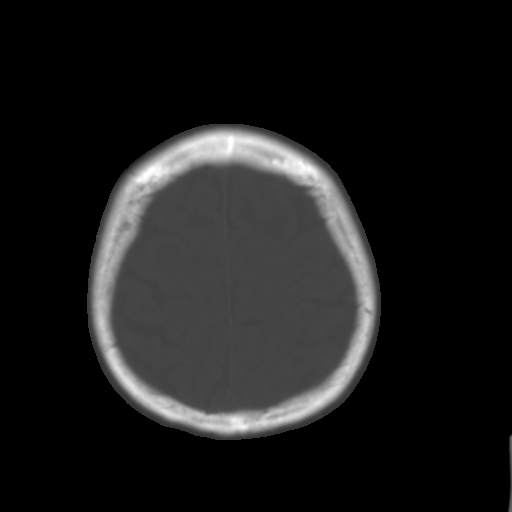
[im 29/36  brain]
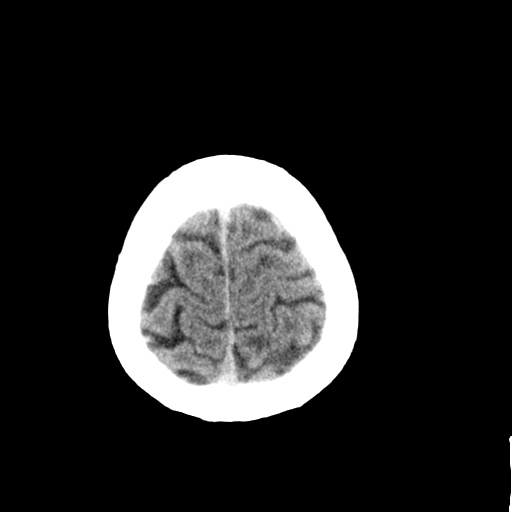
[im 32/36  brain]
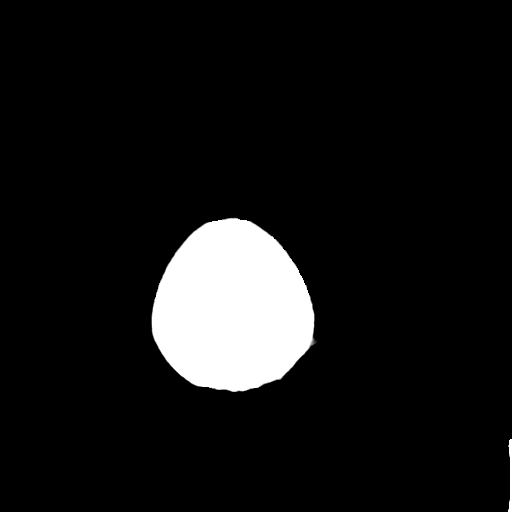
[im 34/36  brain]
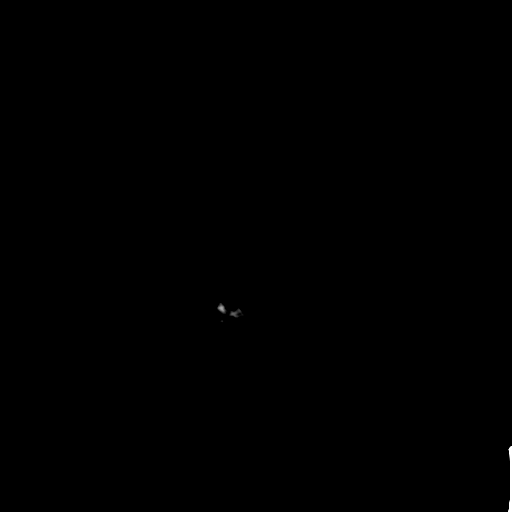

[16 of 30 positions shown; findings below may reference images not displayed]

FINDINGS: Ventricles, cisterns and other CSF spaces are within normal. There
is moderate bilateral chronic ischemic microvascular disease
unchanged. No evidence of mass, mass effect, shift of midline
structures or acute hemorrhage. No definite acute infarction.
Remaining bones and soft tissues are within normal.
IMPRESSION: No acute intracranial findings.

Moderate chronic ischemic microvascular disease unchanged.

## 2017-08-20 IMAGING — MR MR HEAD W/O CM
10 series · 48 of 48 positions shown · non-contrast
Comparison: 09/23/2015 CT.  06/22/2015 MR.

CLINICAL DATA: 85-year-old hypertensive male with difficulty
finding words, slurred speech and confusion onset yesterday. Stroke
2 months ago. Subsequent encounter.

EXAM:
MRI HEAD WITHOUT CONTRAST
TECHNIQUE: Multiplanar, multiecho pulse sequences of the brain and surrounding
structures were obtained without intravenous contrast.

[Series 2: GRE · sagittal · 5.0mm · 0.47mm/px · 4 of 27 slices shown (1 of 2)]
[im 1/27]
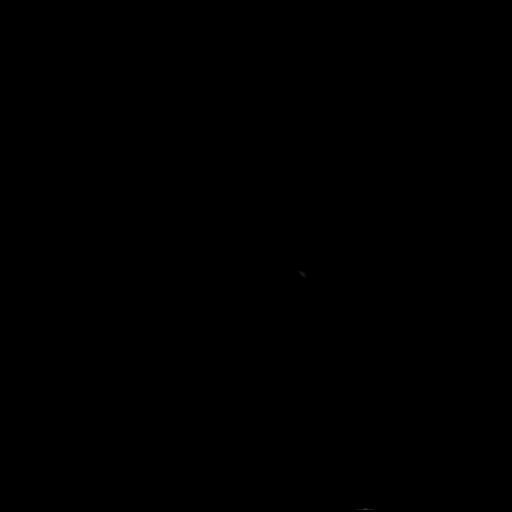
[im 9/27]
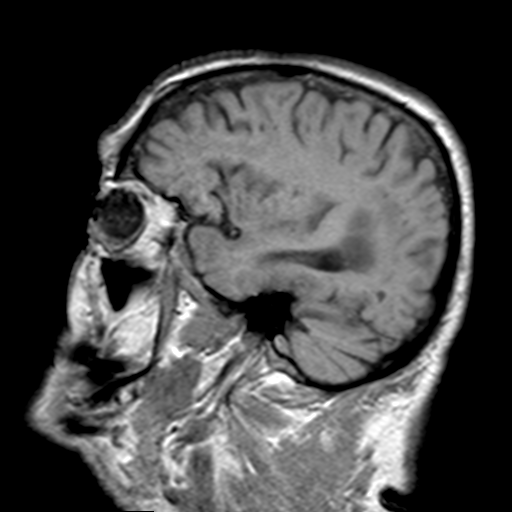
[im 18/27]
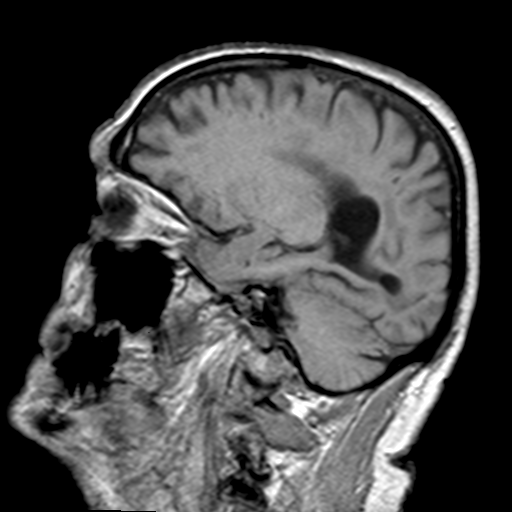
[im 27/27]
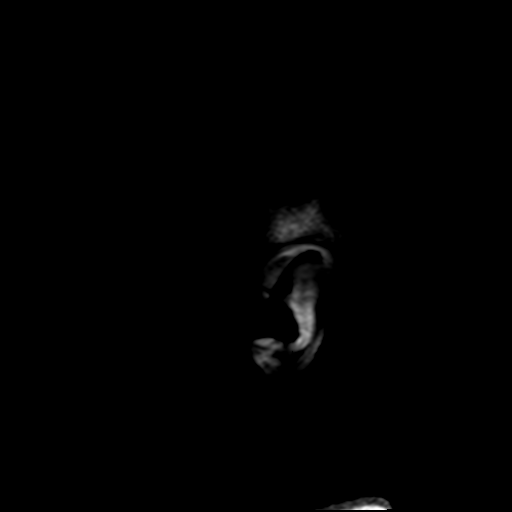

[Series 4: DWI · axial · 3.0mm · 1.80mm/px · z∈[-36,+114]mm · 7 of 53 slices shown (1 of 4)]
[im 1/53]
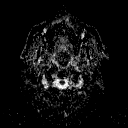
[im 9/53]
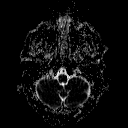
[im 18/53]
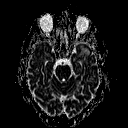
[im 27/53]
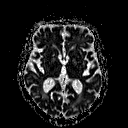
[im 35/53]
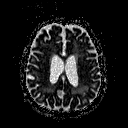
[im 44/53]
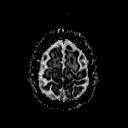
[im 53/53]
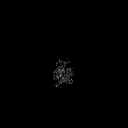

[Series 6: DWI · coronal · 3.0mm · 1.80mm/px · 7 of 51 slices shown (2 of 4)]
[im 1/51]
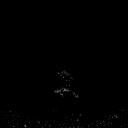
[im 9/51]
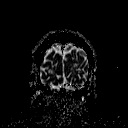
[im 17/51]
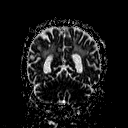
[im 26/51]
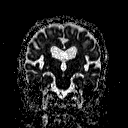
[im 34/51]
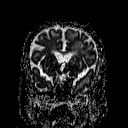
[im 42/51]
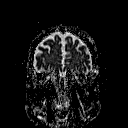
[im 51/51]
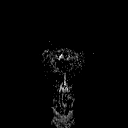

[Series 7: T2 · axial · 5.0mm · 0.45mm/px · z∈[-39,+117]mm · 3 of 27 slices shown (1 of 3)]
[im 1/27]
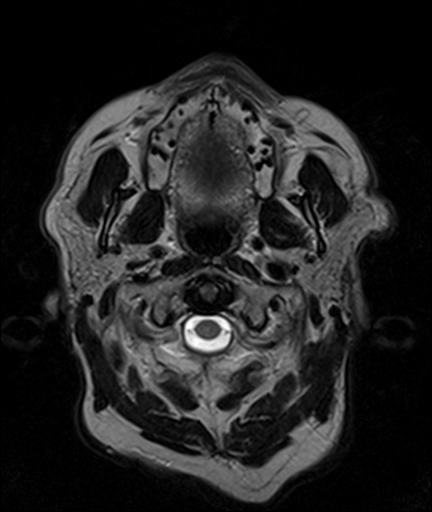
[im 14/27]
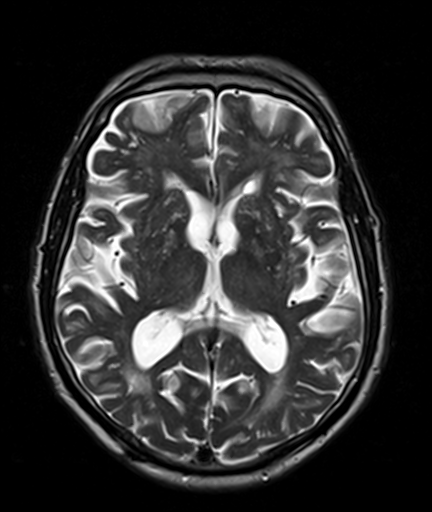
[im 27/27]
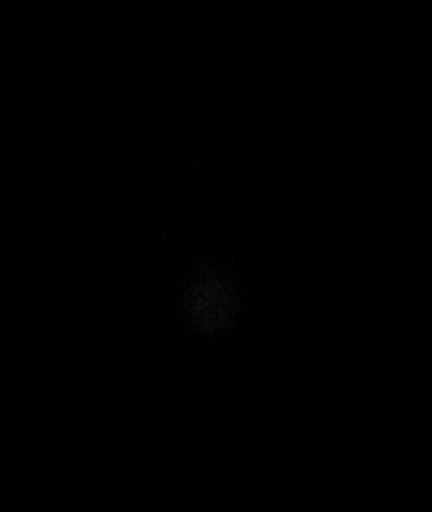

[Series 8: FLAIR · axial · 5.0mm · 0.45mm/px · z∈[-39,+117]mm · 3 of 27 slices shown]
[im 1/27]
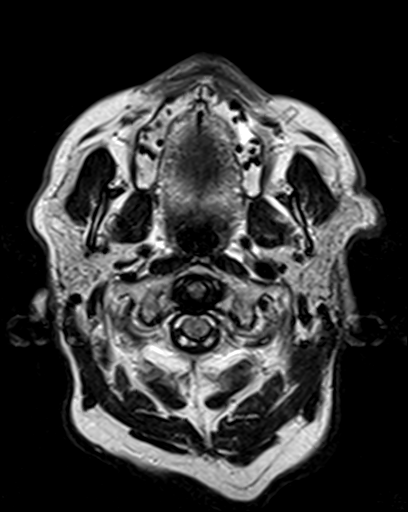
[im 14/27]
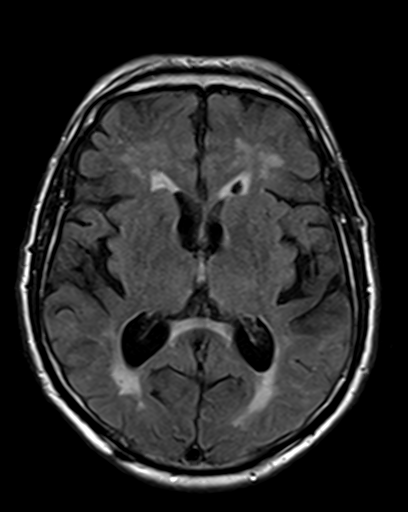
[im 27/27]
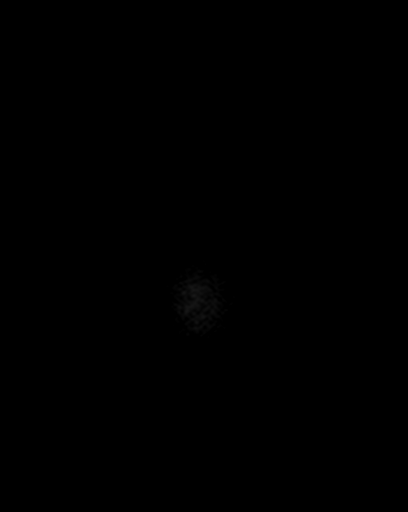

[Series 9: T2 · axial · 5.0mm · 1.20mm/px · z∈[-39,+117]mm · 3 of 27 slices shown (2 of 3)]
[im 1/27]
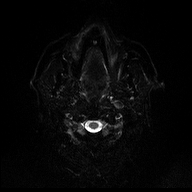
[im 14/27]
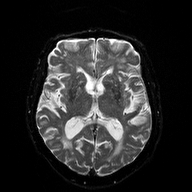
[im 27/27]
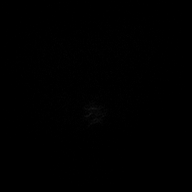

[Series 10: GRE · axial · 5.0mm · 0.45mm/px · z∈[-39,+117]mm · 3 of 27 slices shown (2 of 2)]
[im 1/27]
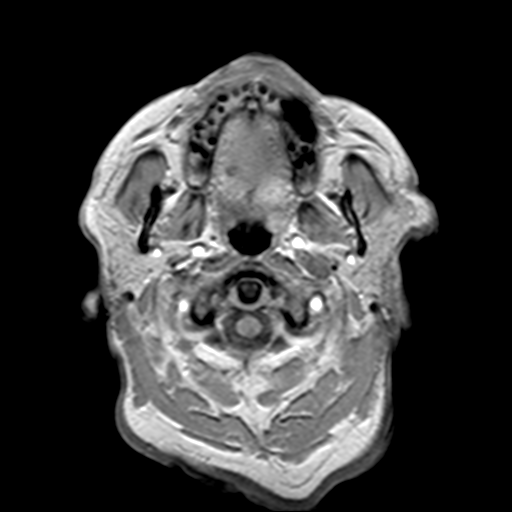
[im 14/27]
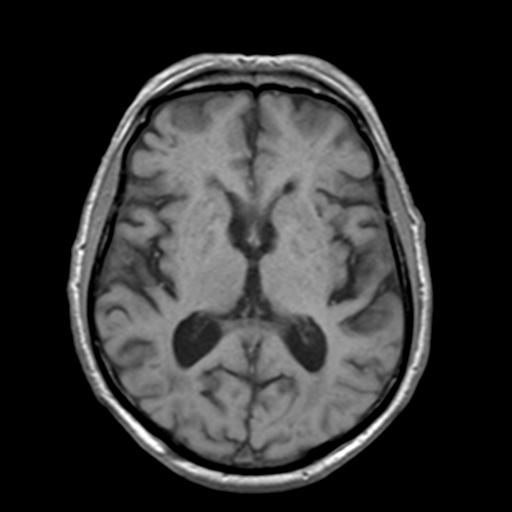
[im 27/27]
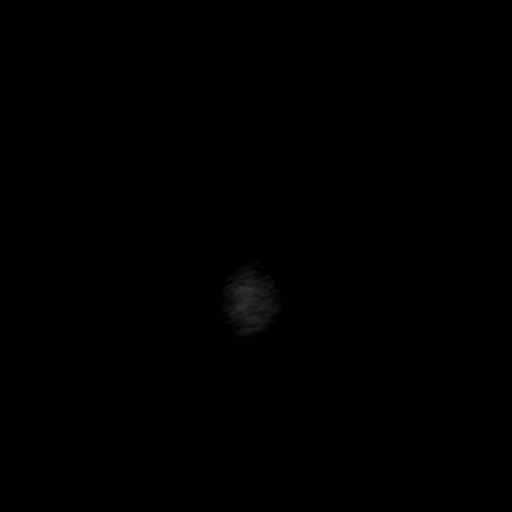

[Series 11: T2 · coronal · 5.0mm · 0.45mm/px · 4 of 31 slices shown (3 of 3)]
[im 1/31]
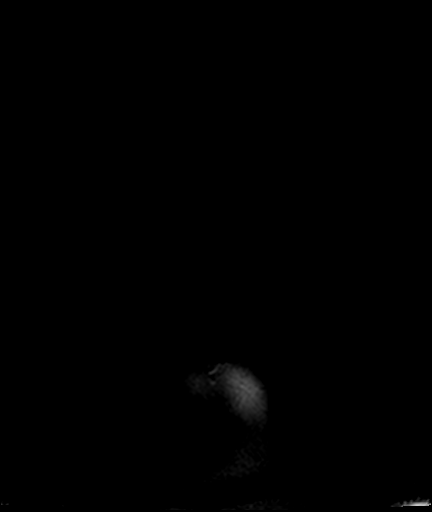
[im 11/31]
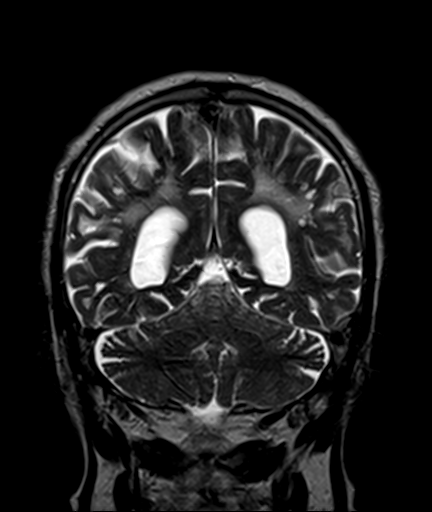
[im 21/31]
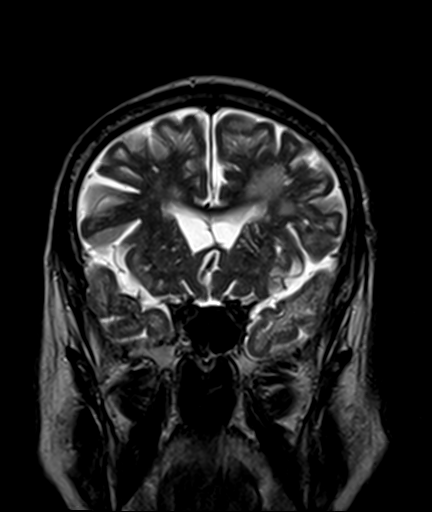
[im 31/31]
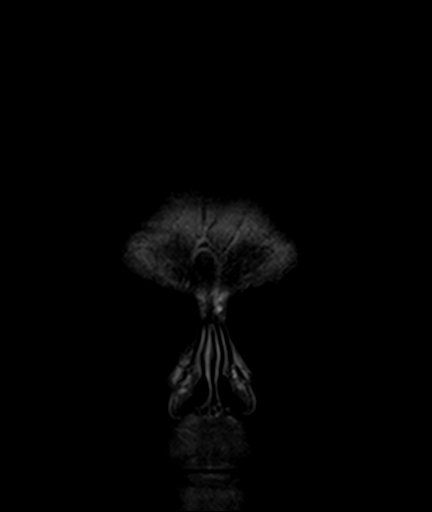

[Series 100: DWI · axial · 3.0mm · 1.80mm/px · z∈[-36,+114]mm · 7 of 55 slices shown (3 of 4)]
[im 1/55]
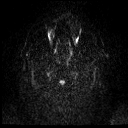
[im 10/55]
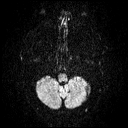
[im 19/55]
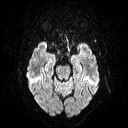
[im 28/55]
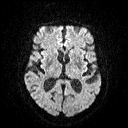
[im 37/55]
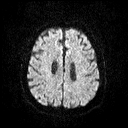
[im 46/55]
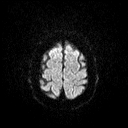
[im 55/55]
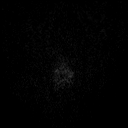

[Series 101: DWI · coronal · 3.0mm · 1.80mm/px · 7 of 51 slices shown (4 of 4)]
[im 1/51]
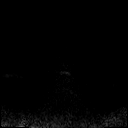
[im 9/51]
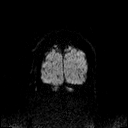
[im 17/51]
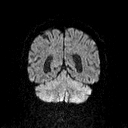
[im 26/51]
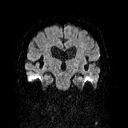
[im 34/51]
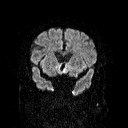
[im 42/51]
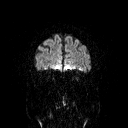
[im 51/51]
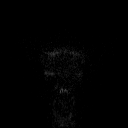

[48 of 48 positions shown; findings below may reference images not displayed]

FINDINGS: No acute infarct.

No intracranial hemorrhage.

Significant diffuse white matter changes most notable
periventricular and subcortical region consistent with result of
small vessel disease and similar to the prior exam.

Global atrophy without hydrocephalus.

No intracranial mass lesion noted on this unenhanced exam.

Major intracranial vascular structures are patent.

Minimal partial opacification inferior left mastoid air cells. Mild
mucosal thickening right maxillary sinus and ethmoid sinus air cells
bilaterally.

C3-4 bulge with mild spinal stenosis and minimal cord contact. Facet
degenerative changes greater on the right.

Post lens replacement otherwise orbital structures unremarkable.

Cervical medullary junction, pituitary region and pineal region
unremarkable.
IMPRESSION: No acute infarct.

No intracranial hemorrhage.

Significant diffuse white matter changes most notable
periventricular and subcortical region consistent with result of
small vessel disease and similar to the prior exam.

Global atrophy without hydrocephalus.

No intracranial mass lesion noted on this unenhanced exam.

Minimal partial opacification inferior left mastoid air cells. Mild
mucosal thickening right maxillary sinus and ethmoid sinus air cells
bilaterally.

C3-4 bulge with mild spinal stenosis.

## 2017-08-31 ENCOUNTER — Ambulatory Visit: Payer: Medicare Other

## 2017-09-08 ENCOUNTER — Ambulatory Visit: Payer: Medicare Other

## 2017-09-09 ENCOUNTER — Ambulatory Visit (INDEPENDENT_AMBULATORY_CARE_PROVIDER_SITE_OTHER): Payer: Medicare Other | Admitting: *Deleted

## 2017-09-09 DIAGNOSIS — E538 Deficiency of other specified B group vitamins: Secondary | ICD-10-CM | POA: Diagnosis not present

## 2017-09-09 MED ORDER — CYANOCOBALAMIN 1000 MCG/ML IJ SOLN
1000.0000 ug | Freq: Once | INTRAMUSCULAR | Status: AC
  Administered 2017-09-09: 1000 ug via INTRAMUSCULAR

## 2017-09-14 ENCOUNTER — Ambulatory Visit: Payer: Self-pay

## 2017-09-14 NOTE — Telephone Encounter (Signed)
Pt. called to report a "mild pain in left lower back, just above the hip area."  Stated the pain is intermittent, and feels "like a tingling".  Voiced concern that it is coming from his kidneys; stated the doctor has been monitoring his kidneys.  Denied fever or chills.  Denied any difficulty with urination or blood in urine.  Denied any radiation of the pain up higher in the back, or down into the left leg.  Denied any numbness or weakness of left LE.  Stated the pain has been there about 2-3 weeks. (corrected the time of occurrence from initial documentation) Per protocol, appt. made for pt. 09/30/17 with Dr. Damita Dunnings.  Care advice given per protocol.   Encouraged to call back if symptoms worsen.  Pt. verb. understanding.       Reason for Disposition . Back pain present > 2 weeks  Answer Assessment - Initial Assessment Questions 1. ONSET: "When did the pain begin?"      1-2 weeks ago  2. LOCATION: "Where does it hurt?" (upper, mid or lower back)     Just above left hip in lower back 3. SEVERITY: "How bad is the pain?"  (e.g., Scale 1-10; mild, moderate, or severe)   - MILD (1-3): doesn't interfere with normal activities    - MODERATE (4-7): interferes with normal activities or awakens from sleep    - SEVERE (8-10): excruciating pain, unable to do any normal activities      mild 4. PATTERN: "Is the pain constant?" (e.g., yes, no; constant, intermittent)     Comes and goes. 5. RADIATION: "Does the pain shoot into your legs or elsewhere?"     Very mild and moves lateral to the hip 6. CAUSE:  "What do you think is causing the back pain?"      Worried that it is caused by kidneys 7. BACK OVERUSE:  "Any recent lifting of heavy objects, strenuous work or exercise?"     No strenuous work 8. MEDICATIONS: "What have you taken so far for the pain?" (e.g., nothing, acetaminophen, NSAIDS)     Uses Acetaminophen for lower leg pain 9. NEUROLOGIC SYMPTOMS: "Do you have any weakness, numbness, or problems  with bowel/bladder control?"     Tingling type pain; denied weakness of left LE; has right foot drop  10. OTHER SYMPTOMS: "Do you have any other symptoms?" (e.g., fever, abdominal pain, burning with urination, blood in urine)       No fever; has had left lower quad. Intermittent abdominal pain recently; denies any urinary problems  11. PREGNANCY: "Is there any chance you are pregnant?" (e.g., yes, no; LMP)      n/a  Protocols used: BACK PAIN-A-AH

## 2017-09-27 ENCOUNTER — Other Ambulatory Visit: Payer: Self-pay | Admitting: Family Medicine

## 2017-09-30 ENCOUNTER — Encounter: Payer: Self-pay | Admitting: Family Medicine

## 2017-09-30 ENCOUNTER — Ambulatory Visit (INDEPENDENT_AMBULATORY_CARE_PROVIDER_SITE_OTHER): Payer: Medicare Other | Admitting: Family Medicine

## 2017-09-30 DIAGNOSIS — M7062 Trochanteric bursitis, left hip: Secondary | ICD-10-CM | POA: Diagnosis not present

## 2017-09-30 NOTE — Progress Notes (Signed)
His wife had broken hip earlier this year, she is doing better.   Lamictal rx per Dr. Manuella Ghazi.  He had some mild L groin pain and some mild L lower back pain.  Prev called triage on 09/14/17.  Groin pain resolved in the meantime.    He had some L hip area pain.  That was the most concerning issue for patient, since it was present more than the other concerns but it isn't severe.  Noted more when sleeping on the L side.    He had a prickling sensation at a lesion that was removed per derm.  That resolved but then he had some similar sx in other regions of the back, present intermittently.    No sx radiating into the legs.  No FCNAVD.  No R hip pain like the L side.    Meds, vitals, and allergies reviewed.   ROS: Per HPI unless specifically indicated in ROS section   nad ncat rrr ctab abd soft, not ttp Possible dermatomal distribution with change in sensation  on the R lower back but patient is sure at the time of the OV.  No rash.  Normal L hip ROM, able to bear weight but ttp near L greater troch bursa.

## 2017-09-30 NOTE — Patient Instructions (Signed)
Likely bursitis near the left hip.  Likely not a hip issue otherwise.  Use ice, 5 minutes on/off, but not directly on the skin.  If the tingling/prickling sensation in your back gets worse then let me know.  Take care.  Glad to see you.

## 2017-10-01 NOTE — Assessment & Plan Note (Signed)
Likely L greater troch bursitis.  D/w pt.  Not severe, would ice and update me as needed.  He agrees.    He does have some possible dermatomal sensation changes on the back but no rash and mild sx overall.  He could have mild nerve root compression, I don't suspect shingles at this point, no need for antivirals at this point.  D/w pt.  Update me as needed.

## 2017-10-07 ENCOUNTER — Ambulatory Visit: Payer: Medicare Other

## 2017-10-13 ENCOUNTER — Ambulatory Visit: Payer: Medicare Other

## 2017-10-14 ENCOUNTER — Ambulatory Visit (INDEPENDENT_AMBULATORY_CARE_PROVIDER_SITE_OTHER): Payer: Medicare Other

## 2017-10-14 DIAGNOSIS — E538 Deficiency of other specified B group vitamins: Secondary | ICD-10-CM | POA: Diagnosis not present

## 2017-10-14 MED ORDER — CYANOCOBALAMIN 1000 MCG/ML IJ SOLN
1000.0000 ug | Freq: Once | INTRAMUSCULAR | Status: AC
  Administered 2017-10-14: 1000 ug via INTRAMUSCULAR

## 2017-11-01 ENCOUNTER — Other Ambulatory Visit: Payer: Self-pay | Admitting: Family Medicine

## 2017-11-15 ENCOUNTER — Encounter: Payer: Self-pay | Admitting: *Deleted

## 2017-11-15 ENCOUNTER — Other Ambulatory Visit: Payer: Self-pay | Admitting: Family Medicine

## 2017-11-17 ENCOUNTER — Ambulatory Visit: Payer: Medicare Other

## 2017-11-19 ENCOUNTER — Telehealth: Payer: Self-pay | Admitting: Family Medicine

## 2017-11-19 MED ORDER — ROSUVASTATIN CALCIUM 20 MG PO TABS: 20.0000 mg | ORAL_TABLET | Freq: Every day | ORAL | 0 refills | Status: DC

## 2017-11-19 NOTE — Telephone Encounter (Signed)
Copied from [place]. Topic: Quick Communication - See Telephone Encounter >> Nov 19, 2017  1:17 PM Ether Griffins B wrote: CRM for notification. See Telephone encounter for:  pt is upset he cant get refills until he comes in for a cpe with Dr. Damita Dunnings. He states he was just in there a month ago and Dr. Damita Dunnings took care of his problems at that appt. I informed him he needed a physical with lab work. He stated he would have to just go with out the medication for awhile because he has lost everything on his computer trying to do his taxes and his nerves cant handle dealing with all this at one time.  11/19/17.

## 2017-11-19 NOTE — Telephone Encounter (Signed)
Spoke with patient who agreed to call for a CPE with labs prior.  The medication that he needed refilled was his Crestor.  Rx sent

## 2017-11-19 NOTE — Telephone Encounter (Signed)
Due for labs prior to yearly visit.  Okay to fill meds until that point, if he schedules a visit.   I didn't cut him off from his rxs.  Some meds recently refilled.  . Thanks.

## 2017-12-01 NOTE — Telephone Encounter (Signed)
A physical appt was made for the patient on 12/13/17 and labs on 12/07/17.  Patient refused a Medicare Wellness Visit.

## 2017-12-06 ENCOUNTER — Other Ambulatory Visit: Payer: Self-pay | Admitting: Family Medicine

## 2017-12-06 DIAGNOSIS — I10 Essential (primary) hypertension: Secondary | ICD-10-CM

## 2017-12-07 ENCOUNTER — Other Ambulatory Visit (INDEPENDENT_AMBULATORY_CARE_PROVIDER_SITE_OTHER): Payer: Medicare Other

## 2017-12-07 DIAGNOSIS — I10 Essential (primary) hypertension: Secondary | ICD-10-CM | POA: Diagnosis not present

## 2017-12-07 LAB — LIPID PANEL
CHOLESTEROL: 127 mg/dL (ref 0–200)
HDL: 57.3 mg/dL (ref >39.00)
LDL Cholesterol: 52 mg/dL (ref 0–99)
NonHDL: 69.75
TRIGLYCERIDES: 89 mg/dL (ref 0.0–149.0)
Total CHOL/HDL Ratio: 2
VLDL: 17.8 mg/dL (ref 0.0–40.0)

## 2017-12-07 LAB — COMPREHENSIVE METABOLIC PANEL
ALBUMIN: 4.4 g/dL (ref 3.5–5.2)
ALK PHOS: 92 U/L (ref 39–117)
ALT: 18 U/L (ref 0–53)
AST: 21 U/L (ref 0–37)
BUN: 19 mg/dL (ref 6–23)
CO2: 29 mEq/L (ref 19–32)
CREATININE: 1.53 mg/dL — AB (ref 0.40–1.50)
Calcium: 10.1 mg/dL (ref 8.4–10.5)
Chloride: 106 mEq/L (ref 96–112)
GFR: 46.16 mL/min — ABNORMAL LOW (ref >60.00)
Glucose, Bld: 112 mg/dL — ABNORMAL HIGH (ref 70–99)
Potassium: 4.8 mEq/L (ref 3.5–5.1)
SODIUM: 140 meq/L (ref 135–145)
TOTAL PROTEIN: 8.4 g/dL — AB (ref 6.0–8.3)
Total Bilirubin: 0.6 mg/dL (ref 0.2–1.2)

## 2017-12-10 DIAGNOSIS — D225 Melanocytic nevi of trunk: Secondary | ICD-10-CM | POA: Diagnosis not present

## 2017-12-10 DIAGNOSIS — Z85828 Personal history of other malignant neoplasm of skin: Secondary | ICD-10-CM | POA: Diagnosis not present

## 2017-12-10 DIAGNOSIS — C44612 Basal cell carcinoma of skin of right upper limb, including shoulder: Secondary | ICD-10-CM | POA: Diagnosis not present

## 2017-12-10 DIAGNOSIS — D2261 Melanocytic nevi of right upper limb, including shoulder: Secondary | ICD-10-CM | POA: Diagnosis not present

## 2017-12-10 DIAGNOSIS — D485 Neoplasm of uncertain behavior of skin: Secondary | ICD-10-CM | POA: Diagnosis not present

## 2017-12-10 DIAGNOSIS — L821 Other seborrheic keratosis: Secondary | ICD-10-CM | POA: Diagnosis not present

## 2017-12-13 ENCOUNTER — Ambulatory Visit (INDEPENDENT_AMBULATORY_CARE_PROVIDER_SITE_OTHER): Payer: Medicare Other | Admitting: Family Medicine

## 2017-12-13 ENCOUNTER — Encounter: Payer: Self-pay | Admitting: Family Medicine

## 2017-12-13 VITALS — BP 122/64 | HR 68 | Temp 97.8°F | Ht 68.0 in | Wt 168.8 lb

## 2017-12-13 DIAGNOSIS — G459 Transient cerebral ischemic attack, unspecified: Secondary | ICD-10-CM | POA: Diagnosis not present

## 2017-12-13 DIAGNOSIS — K2270 Barrett's esophagus without dysplasia: Secondary | ICD-10-CM | POA: Diagnosis not present

## 2017-12-13 DIAGNOSIS — E785 Hyperlipidemia, unspecified: Secondary | ICD-10-CM

## 2017-12-13 DIAGNOSIS — E538 Deficiency of other specified B group vitamins: Secondary | ICD-10-CM

## 2017-12-13 DIAGNOSIS — M25512 Pain in left shoulder: Secondary | ICD-10-CM | POA: Diagnosis not present

## 2017-12-13 DIAGNOSIS — I10 Essential (primary) hypertension: Secondary | ICD-10-CM | POA: Diagnosis not present

## 2017-12-13 DIAGNOSIS — Z0000 Encounter for general adult medical examination without abnormal findings: Secondary | ICD-10-CM

## 2017-12-13 DIAGNOSIS — Z789 Other specified health status: Secondary | ICD-10-CM

## 2017-12-13 MED ORDER — CLOPIDOGREL BISULFATE 75 MG PO TABS: 75.0000 mg | ORAL_TABLET | Freq: Every day | ORAL | 3 refills | Status: DC

## 2017-12-13 MED ORDER — CYANOCOBALAMIN 1000 MCG/ML IJ SOLN
1000.0000 ug | Freq: Once | INTRAMUSCULAR | Status: AC
  Administered 2017-12-13: 1000 ug via INTRAMUSCULAR

## 2017-12-13 MED ORDER — ROSUVASTATIN CALCIUM 20 MG PO TABS: 20.0000 mg | ORAL_TABLET | Freq: Every day | ORAL | 3 refills | Status: DC

## 2017-12-13 MED ORDER — LISINOPRIL 20 MG PO TABS: 20.0000 mg | ORAL_TABLET | Freq: Every day | ORAL | 3 refills | Status: DC

## 2017-12-13 MED ORDER — PANTOPRAZOLE SODIUM 40 MG PO TBEC: 40.0000 mg | DELAYED_RELEASE_TABLET | Freq: Every day | ORAL | 3 refills | Status: DC

## 2017-12-13 MED ORDER — CYANOCOBALAMIN 1000 MCG/ML IJ SOLN: 1000.0000 ug | INTRAMUSCULAR | Status: DC

## 2017-12-13 NOTE — Patient Instructions (Signed)
Don't change your meds for now.  Work on shoulder shrugs to get your back stronger- 10 in the AM and 10 in the PM.  That can help your left shoulder.  Update me as needed.  We can recheck B12 prior an injection here in the clinic.  Take care.  Glad to see you.

## 2017-12-13 NOTE — Progress Notes (Signed)
I have personally reviewed the Medicare Annual Wellness questionnaire and have noted 1. The patient's medical and social history 2. Their use of alcohol, tobacco or illicit drugs 3. Their current medications and supplements 4. The patient's functional ability including ADL's, fall risks, home safety risks and hearing or visual             impairment. 5. Diet and physical activities 6. Evidence for depression or mood disorders  The patients weight, height, BMI have been recorded in the chart and visual acuity is per eye clinic.  I have made referrals, counseling and provided education to the patient based review of the above and I have provided the pt with a written personalized care plan for preventive services.  Provider list updated- see scanned forms.  Routine anticipatory guidance given to patient.  See health maintenance. The possibility exists that previously documented standard health maintenance information may have been brought forward from a previous encounter into this note.  If needed, that same information has been updated to reflect the current situation based on today's encounter.    See scanned forms. Routine anticipatory guidance given to patient. See health maintenance. Flu 2018 Shingles 2013 PNA prev done Tetanus 2013 Colonoscopy not indicated given age. Pt agrees.  Prostate cancer screening not indicated given age, prev neg biopsy x2. Pt agrees. Prostate cancer screening and PSA options (with potential risks and benefits of testing vs not testing) were discussed along with recent recs/guidelines. He declined testing PSA at this point. Advance directive d/w pt, on file.  Would have wife or daughter designated if patient were incapacitated.  Cognitive function addressed- see scanned forms- and if abnormal then additional documentation follows.   Some L shoulder discomfort. Some clicking and some discomfort with int rotation and inferior posterior movement.  . No  falls, no trauma. No severe pain.  Minimal pain sleeping on L side.   Prev foot drop resolved.   No residual sx.  No new sx.    H/o  Barrett's on PPI.  No dysphagia, no ADE on med.  Compliant.    H/o TIA in 2016.  D/w pt about secondary prevention.  On ACE, statin, ASA/plavix.  No ADE on meds.  Labs d/w pt.   H/o B12 def. On replacement monthly.    Hypertension:    Using medication without problems or lightheadedness: yes Chest pain with exertion:no Edema:no Short of breath:no  Elevated Cholesterol: Using medications without problems:yes Muscle aches: occ leg aches at night, mild.   Diet compliance: yes Exercise: encouraged.    He and his wife moved into a house with his daughter.   PMH and SH reviewed  Meds, vitals, and allergies reviewed.   ROS: Per HPI.  Unless specifically indicated otherwise in HPI, the patient denies:  General: fever. Eyes: acute vision changes ENT: sore throat Cardiovascular: chest pain Respiratory: SOB GI: vomiting GU: dysuria Musculoskeletal: acute back pain Derm: acute rash Neuro: acute motor dysfunction Psych: worsening mood Endocrine: polydipsia Heme: bleeding Allergy: hayfever  GEN: nad, alert and oriented HEENT: mucous membranes moist NECK: supple w/o LA CV: rrr. PULM: ctab, no inc wob ABD: soft, +bs EXT: no edema SKIN: no acute rash L shoulder click with int but not ext rotation.  He has anterior rotation of the scapula B.  No arm drop.  L supraspinatus testing not painful.  AC not ttp or sore on testing.

## 2017-12-14 ENCOUNTER — Other Ambulatory Visit: Payer: Self-pay | Admitting: Family Medicine

## 2017-12-14 ENCOUNTER — Telehealth: Payer: Self-pay

## 2017-12-14 DIAGNOSIS — E538 Deficiency of other specified B group vitamins: Secondary | ICD-10-CM

## 2017-12-14 MED ORDER — CYANOCOBALAMIN 1000 MCG/ML IJ SOLN: 1000.0000 ug | INTRAMUSCULAR | Status: DC

## 2017-12-14 NOTE — Telephone Encounter (Signed)
-----   Message from [name], MD sent at 12/14/2017  6:48 AM EDT ----- Regarding: B12 level. I need patient to get a B12 level prior to his next injection.  There is an order in the EMR for the lab.  Thanks.  Brigitte Pulse

## 2017-12-14 NOTE — Assessment & Plan Note (Signed)
No adverse effect on medication.  Labs discussed with patient.  Continue as is.  He agrees.

## 2017-12-14 NOTE — Assessment & Plan Note (Signed)
H/o  Barrett's on PPI.  No dysphagia, no ADE on med.  Compliant.   Continue as is.  D/w pt.

## 2017-12-14 NOTE — Assessment & Plan Note (Signed)
L shoulder click with int but not ext rotation.  He has anterior rotation of the scapula B.  No arm drop.  L supraspinatus testing not painful.  AC not ttp or sore on testing.  Discussed with patient about shoulder shrugs to strengthen the muscles on the back as that may help.  Rationale discussed with patient.  He understood.

## 2017-12-14 NOTE — Assessment & Plan Note (Signed)
H/o TIA in 2016.  D/w pt about secondary prevention.  On ACE, statin, ASA/plavix.  No ADE on meds.  Labs d/w pt. would continue current medications.  He agrees.

## 2017-12-14 NOTE — Assessment & Plan Note (Signed)
Advance directive d/w pt, on file.  Would have wife or daughter designated if patient were incapacitated.

## 2017-12-14 NOTE — Assessment & Plan Note (Signed)
Injection done today.  We can recheck B12 level later on.

## 2017-12-14 NOTE — Telephone Encounter (Signed)
I spoke with pt and lab and nurse visit scheduled for 01/18/18; B12 level prior to b12 inj. Pt wrote down info and voiced understanding.

## 2017-12-14 NOTE — Assessment & Plan Note (Signed)
See scanned forms. Routine anticipatory guidance given to patient. See health maintenance. Flu 2018 Shingles 2013 PNA prev done Tetanus 2013 Colonoscopy not indicated given age. Pt agrees.  Prostate cancer screening not indicated given age, prev neg biopsy x2. Pt agrees. Prostate cancer screening and PSA options (with potential risks and benefits of testing vs not testing) were discussed along with recent recs/guidelines. He declined testing PSA at this point. Advance directive d/w pt, on file.  Would have wife or daughter designated if patient were incapacitated.  Cognitive function addressed- see scanned forms- and if abnormal then additional documentation follows.

## 2017-12-22 ENCOUNTER — Ambulatory Visit: Payer: Medicare Other

## 2018-01-09 ENCOUNTER — Other Ambulatory Visit: Payer: Self-pay | Admitting: Family Medicine

## 2018-01-18 ENCOUNTER — Ambulatory Visit (INDEPENDENT_AMBULATORY_CARE_PROVIDER_SITE_OTHER): Payer: Medicare Other

## 2018-01-18 ENCOUNTER — Other Ambulatory Visit (INDEPENDENT_AMBULATORY_CARE_PROVIDER_SITE_OTHER): Payer: Medicare Other

## 2018-01-18 DIAGNOSIS — E538 Deficiency of other specified B group vitamins: Secondary | ICD-10-CM

## 2018-01-18 LAB — VITAMIN B12: VITAMIN B 12: 479 pg/mL (ref 211–911)

## 2018-01-18 MED ORDER — CYANOCOBALAMIN 1000 MCG/ML IJ SOLN
1000.0000 ug | Freq: Once | INTRAMUSCULAR | Status: AC
  Administered 2018-01-18: 1000 ug via INTRAMUSCULAR

## 2018-02-22 ENCOUNTER — Ambulatory Visit: Payer: Medicare Other

## 2018-02-22 DIAGNOSIS — C44612 Basal cell carcinoma of skin of right upper limb, including shoulder: Secondary | ICD-10-CM | POA: Diagnosis not present

## 2018-02-23 ENCOUNTER — Ambulatory Visit (INDEPENDENT_AMBULATORY_CARE_PROVIDER_SITE_OTHER): Payer: Medicare Other | Admitting: *Deleted

## 2018-02-23 DIAGNOSIS — E538 Deficiency of other specified B group vitamins: Secondary | ICD-10-CM

## 2018-02-23 MED ORDER — CYANOCOBALAMIN 1000 MCG/ML IJ SOLN
1000.0000 ug | Freq: Once | INTRAMUSCULAR | Status: AC
  Administered 2018-02-23: 1000 ug via INTRAMUSCULAR

## 2018-03-22 ENCOUNTER — Telehealth: Payer: Self-pay | Admitting: Family Medicine

## 2018-03-22 NOTE — Telephone Encounter (Signed)
Copied from [place] [id number]. Topic: Quick Communication - See Telephone Encounter >> Mar 22, 2018  2:25 PM Justin Ford wrote: Pt asking which medication Dr. Damita Dunnings prescribes pt that causes bruising. Please call pt to discuss.

## 2018-03-23 NOTE — Telephone Encounter (Signed)
Thanks

## 2018-03-23 NOTE — Telephone Encounter (Signed)
Please call pt.   Aspirin and plavix can make it worse but any 84 year old patient would likely have increased risk of bruising at baseline, even w/o the meds.  Please get info on severity, location, and any other bleeding.   If sig sx, then set up OV to get checked.  We may or may not need to do labs at the visit.  Wouldn't need to fast.   Wouldn't stop meds in the meantime unless sig bleeding.   Thanks.

## 2018-03-23 NOTE — Telephone Encounter (Signed)
Patient notified as instructed by telephone and verbalized understanding. Patient stated that he has a bruise on his back and does not have any bleeding. Patient stated that he had surgery on his arm by dermatologist on 03/08/18 and is doing well with that. Patient stated that he will keep and eye on the bruise on his back and if it does not continue to improve he will call back for an appointment. Patient stated that he does not feel that he needs to come in to be checked at this time.

## 2018-03-29 ENCOUNTER — Ambulatory Visit (INDEPENDENT_AMBULATORY_CARE_PROVIDER_SITE_OTHER): Payer: Medicare Other

## 2018-03-29 DIAGNOSIS — E538 Deficiency of other specified B group vitamins: Secondary | ICD-10-CM

## 2018-03-29 MED ORDER — CYANOCOBALAMIN 1000 MCG/ML IJ SOLN
1000.0000 ug | Freq: Once | INTRAMUSCULAR | Status: AC
  Administered 2018-03-29: 1000 ug via INTRAMUSCULAR

## 2018-03-29 NOTE — Progress Notes (Signed)
Per orders of Dr. Gutierrez, injection of Vit B 12 given by Rena Isley. Patient tolerated injection well.  

## 2018-03-30 ENCOUNTER — Ambulatory Visit: Payer: Medicare Other

## 2018-05-03 ENCOUNTER — Ambulatory Visit (INDEPENDENT_AMBULATORY_CARE_PROVIDER_SITE_OTHER): Payer: Medicare Other | Admitting: *Deleted

## 2018-05-03 DIAGNOSIS — E538 Deficiency of other specified B group vitamins: Secondary | ICD-10-CM

## 2018-05-03 MED ORDER — CYANOCOBALAMIN 1000 MCG/ML IJ SOLN
1000.0000 ug | Freq: Once | INTRAMUSCULAR | Status: AC
  Administered 2018-05-03: 1000 ug via INTRAMUSCULAR

## 2018-05-03 NOTE — Progress Notes (Signed)
Per orders of Dr. Damita Dunnings, injection of b12 given by Modena Nunnery. Patient tolerated injection well.

## 2018-06-08 ENCOUNTER — Ambulatory Visit (INDEPENDENT_AMBULATORY_CARE_PROVIDER_SITE_OTHER): Payer: Medicare Other

## 2018-06-08 DIAGNOSIS — E538 Deficiency of other specified B group vitamins: Secondary | ICD-10-CM

## 2018-06-08 MED ORDER — CYANOCOBALAMIN 1000 MCG/ML IJ SOLN
1000.0000 ug | Freq: Once | INTRAMUSCULAR | Status: AC
  Administered 2018-06-08: 1000 ug via INTRAMUSCULAR

## 2018-06-08 NOTE — Progress Notes (Signed)
Per orders of Dr. Glori Bickers, injection of Vitamin B 12 given by Ozzie Hoyle. Patient tolerated injection well.

## 2018-06-27 DIAGNOSIS — L570 Actinic keratosis: Secondary | ICD-10-CM | POA: Diagnosis not present

## 2018-06-27 DIAGNOSIS — D225 Melanocytic nevi of trunk: Secondary | ICD-10-CM | POA: Diagnosis not present

## 2018-06-27 DIAGNOSIS — D2262 Melanocytic nevi of left upper limb, including shoulder: Secondary | ICD-10-CM | POA: Diagnosis not present

## 2018-06-27 DIAGNOSIS — Z08 Encounter for follow-up examination after completed treatment for malignant neoplasm: Secondary | ICD-10-CM | POA: Diagnosis not present

## 2018-06-27 DIAGNOSIS — D2271 Melanocytic nevi of right lower limb, including hip: Secondary | ICD-10-CM | POA: Diagnosis not present

## 2018-06-27 DIAGNOSIS — D2272 Melanocytic nevi of left lower limb, including hip: Secondary | ICD-10-CM | POA: Diagnosis not present

## 2018-06-27 DIAGNOSIS — X32XXXA Exposure to sunlight, initial encounter: Secondary | ICD-10-CM | POA: Diagnosis not present

## 2018-06-27 DIAGNOSIS — Z85828 Personal history of other malignant neoplasm of skin: Secondary | ICD-10-CM | POA: Diagnosis not present

## 2018-06-27 DIAGNOSIS — L821 Other seborrheic keratosis: Secondary | ICD-10-CM | POA: Diagnosis not present

## 2018-06-27 DIAGNOSIS — D2261 Melanocytic nevi of right upper limb, including shoulder: Secondary | ICD-10-CM | POA: Diagnosis not present

## 2018-06-29 DIAGNOSIS — R569 Unspecified convulsions: Secondary | ICD-10-CM | POA: Diagnosis not present

## 2018-06-29 DIAGNOSIS — G451 Carotid artery syndrome (hemispheric): Secondary | ICD-10-CM | POA: Diagnosis not present

## 2018-06-29 DIAGNOSIS — R251 Tremor, unspecified: Secondary | ICD-10-CM | POA: Diagnosis not present

## 2018-06-29 DIAGNOSIS — G608 Other hereditary and idiopathic neuropathies: Secondary | ICD-10-CM | POA: Diagnosis not present

## 2018-07-04 ENCOUNTER — Ambulatory Visit (INDEPENDENT_AMBULATORY_CARE_PROVIDER_SITE_OTHER): Payer: Medicare Other | Admitting: Family Medicine

## 2018-07-04 ENCOUNTER — Encounter: Payer: Self-pay | Admitting: Family Medicine

## 2018-07-04 VITALS — BP 158/80 | HR 55 | Temp 97.6°F | Ht 68.0 in | Wt 168.0 lb

## 2018-07-04 DIAGNOSIS — M109 Gout, unspecified: Secondary | ICD-10-CM

## 2018-07-04 MED ORDER — PREDNISONE 10 MG PO TABS: ORAL_TABLET | ORAL | 0 refills | Status: DC

## 2018-07-04 NOTE — Patient Instructions (Signed)
Good to see you today  I have sent in prednisone for your toe- I think it is a gout flare  If not better in a couple of days or if worse, please follow up

## 2018-07-04 NOTE — Progress Notes (Signed)
Subjective:    Patient ID: Justin Ford, male    DOB: December 23, 1929, 84 y.o.   MRN: [id number]  HPI This is an 84 yo male who presents today with left toe pain x 3-4 days. First started with some sharp pains that were intermittent. Was seen for similar symptoms 12/17 and diagnosed with gout. Normal uric acid.   Past Medical History:  Diagnosis Date  . Barrett's esophagus   . Blood in stool    colonoscopy done ~2010  . Cancer ([place])    basal cell CA per derm  . GERD (gastroesophageal reflux disease)   . Hyperlipidemia   . Hypertension   . Kidney stones   . TIA (transient ischemic attack)    2016   Past Surgical History:  Procedure Laterality Date  . PROSTATE BIOPSY     neg x2 per patient  . SHOULDER SURGERY     per Dr. Theda Sers, R shoulder   Family History  Problem Relation Age of Onset  . Dementia Mother   . Alcohol abuse Father   . Heart disease Brother   . Cancer Sister        possible breast cancer, pt was unsure  . Colon cancer Neg Hx   . Prostate cancer Neg Hx    Social History   Tobacco Use  . Smoking status: Former Smoker    Packs/day: 0.25    Years: 4.00    Pack years: 1.00    Last attempt to quit: 10/05/1950    Years since quitting: 67.7  . Smokeless tobacco: Never Used  Substance Use Topics  . Alcohol use: Yes    Alcohol/week: 2.0 standard drinks    Types: 2 Cans of beer per week    Comment: wine 2-3 times weekly  . Drug use: No      Review of Systems Per HPI    Objective:   Physical Exam  Constitutional: He is oriented to person, place, and time. He appears well-nourished.  HENT:  Head: Normocephalic and atraumatic.  Eyes: Conjunctivae are normal.  Cardiovascular: Normal rate.  Pulmonary/Chest: Effort normal.  Musculoskeletal:  Left great toe with erythema and pain at MTP. No warmth, no streaking.   Neurological: He is alert and oriented to person, place, and time.  Skin: Skin is warm and dry.  Psychiatric: He has a normal mood and  affect. His behavior is normal. Judgment and thought content normal.  Vitals reviewed.     BP (!) 158/80 (BP Location: Left Arm, Patient Position: Sitting, Cuff Size: Normal)   Pulse (!) 55   Temp 97.6 F (36.4 C) (Oral)   Ht 5\' 8"  (1.727 m)   Wt 168 lb (76.2 kg)   SpO2 98%   BMI 25.54 kg/m      Wt Readings from Last 3 Encounters:  07/04/18 168 lb (76.2 kg)  12/13/17 168 lb 12 oz (76.5 kg)  09/30/17 167 lb 12 oz (76.1 kg)   BP Readings from Last 3 Encounters:  07/04/18 (!) 158/80  12/13/17 122/64  09/30/17 (!) 142/78    Assessment & Plan:  1. Acute gout involving toe of left foot, unspecified cause - Provided written and verbal information regarding diagnosis and treatment. - RTC precautions reviewed - discussed pot side effects of medication and encouraged him to take with food and full glass of water - predniSONE (DELTASONE) 10 MG tablet; Take 6 tablets x 1 days, 5 x1, 4x1, 3x1, 2x1, 1x1  Dispense: 21 tablet; Refill: 0 - advised  him to not get influenza vaccine until 2 weeks after he finishes prednisone  Clarene Reamer, FNP-BC  [facility] [place]  07/04/2018 3:32 PM

## 2018-07-12 ENCOUNTER — Telehealth: Payer: Self-pay | Admitting: Family Medicine

## 2018-07-12 ENCOUNTER — Ambulatory Visit (INDEPENDENT_AMBULATORY_CARE_PROVIDER_SITE_OTHER): Payer: Medicare Other | Admitting: *Deleted

## 2018-07-12 DIAGNOSIS — Z23 Encounter for immunization: Secondary | ICD-10-CM

## 2018-07-12 DIAGNOSIS — E538 Deficiency of other specified B group vitamins: Secondary | ICD-10-CM

## 2018-07-12 MED ORDER — CYANOCOBALAMIN 1000 MCG/ML IJ SOLN
1000.0000 ug | Freq: Once | INTRAMUSCULAR | Status: AC
  Administered 2018-07-12: 1000 ug via INTRAMUSCULAR

## 2018-07-12 NOTE — Progress Notes (Signed)
Per orders of Dr. Damita Dunnings, injection of b12 given by Modena Nunnery. Patient tolerated injection well.

## 2018-07-12 NOTE — Telephone Encounter (Signed)
Please triage this.  According to EMR, he just had a course of prednisone.  Did he improve?  What is his status?  Thanks.

## 2018-07-12 NOTE — Telephone Encounter (Signed)
I spoke with pt; pt seen 07/04/18 and has finished prednisone and the top of lt foot is more swollen, red, hot to touch,and pain level of 3. Pt said last night was in bed 9 hrs and this morning no improvement in swelling of upper foot. Dr Damita Dunnings said pt needs to be rechecked today. There are no available appts at [facility] or [place] and pt does not want to go to [place]. Pt prefers not to go to [facility] and requested an appt at [facility] on 07/13/18. Pt scheduled appt with D Gessner FNP 07/13/18 at 8 AM. If pt condition worsens prior to appt pt will go to UC. FYI to Dr Damita Dunnings and Glenda Chroman FNP.

## 2018-07-12 NOTE — Telephone Encounter (Signed)
Patient came into the office for B-12 injection today and requested. He requested a refill of Prednisone 10 MG for gout in left foot. Please advise

## 2018-07-13 ENCOUNTER — Ambulatory Visit (INDEPENDENT_AMBULATORY_CARE_PROVIDER_SITE_OTHER)
Admission: RE | Admit: 2018-07-13 | Discharge: 2018-07-13 | Disposition: A | Discharge status: Home / Self Care | Payer: Medicare Other | Source: Ambulatory Visit | Attending: Family Medicine | Admitting: Family Medicine

## 2018-07-13 ENCOUNTER — Other Ambulatory Visit: Payer: Self-pay | Admitting: Family Medicine

## 2018-07-13 ENCOUNTER — Ambulatory Visit (INDEPENDENT_AMBULATORY_CARE_PROVIDER_SITE_OTHER): Payer: Medicare Other | Admitting: Family Medicine

## 2018-07-13 ENCOUNTER — Encounter: Payer: Self-pay | Admitting: Family Medicine

## 2018-07-13 VITALS — BP 126/80 | HR 63

## 2018-07-13 DIAGNOSIS — M109 Gout, unspecified: Secondary | ICD-10-CM

## 2018-07-13 DIAGNOSIS — M79675 Pain in left toe(s): Secondary | ICD-10-CM

## 2018-07-13 MED ORDER — PREDNISONE 20 MG PO TABS: ORAL_TABLET | ORAL | 0 refills | Status: DC

## 2018-07-13 NOTE — Telephone Encounter (Signed)
Noted  

## 2018-07-13 NOTE — Telephone Encounter (Signed)
Noted. Thanks.

## 2018-07-13 NOTE — Progress Notes (Signed)
Subjective:    Patient ID: Justin Ford, male    DOB: 09/17/1930, 84 y.o.   MRN: [id number]  HPI This is an 84 yo male who presents with continued left great toe swelling and pain. Was seen 07/04/18 and treated with prednisone for suspected recurrent gout. He reports that his swelling decreased for first couple of days on prednisone then returned. Not worse than before, pain about a 1, no problems with walking. Some difficulty wearing some of his shoes. No fever or chills, otherwise feels well. Tolerated prednisone without side effects.    Past Medical History:  Diagnosis Date  . Barrett's esophagus   . Blood in stool    colonoscopy done ~2010  . Cancer ([place])    basal cell CA per derm  . GERD (gastroesophageal reflux disease)   . Hyperlipidemia   . Hypertension   . Kidney stones   . TIA (transient ischemic attack)    2016   Past Surgical History:  Procedure Laterality Date  . PROSTATE BIOPSY     neg x2 per patient  . SHOULDER SURGERY     per Dr. Theda Sers, R shoulder   Family History  Problem Relation Age of Onset  . Dementia Mother   . Alcohol abuse Father   . Heart disease Brother   . Cancer Sister        possible breast cancer, pt was unsure  . Colon cancer Neg Hx   . Prostate cancer Neg Hx    Social History   Tobacco Use  . Smoking status: Former Smoker    Packs/day: 0.25    Years: 4.00    Pack years: 1.00    Last attempt to quit: 10/05/1950    Years since quitting: 67.8  . Smokeless tobacco: Never Used  Substance Use Topics  . Alcohol use: Yes    Alcohol/week: 2.0 standard drinks    Types: 2 Cans of beer per week    Comment: wine 2-3 times weekly  . Drug use: No      Review of Systems Per HPI    Objective:   Physical Exam  Constitutional: He is oriented to person, place, and time. He appears well-developed and well-nourished. No distress.  HENT:  Head: Normocephalic and atraumatic.  Eyes: Conjunctivae are normal.  Cardiovascular: Normal rate  and regular rhythm.  Pulmonary/Chest: Effort normal.  Musculoskeletal:  First MTP joint with mild swelling and erythema that extends across 2,3 rd digits. Good ROM, pain with palpation of joint. ? Mildly increased temperature. No streaking. No lesions. Foot warm, normal color, palpable PT/DP pulses.   Neurological: He is alert and oriented to person, place, and time.  Skin: Skin is warm and dry. He is not diaphoretic.  Psychiatric: He has a normal mood and affect. His behavior is normal. Judgment and thought content normal.  Vitals reviewed.    BP 126/80 (BP Location: Left Arm, Patient Position: Sitting, Cuff Size: Normal)   Pulse 63   SpO2 95%       Assessment & Plan:  1. Acute gout involving toe of left foot, unspecified cause - DG Foot Complete Left; Future  2. Pain of toe of left foot - will get xray since little improvement, do not suspect septic joint as not very painful, no fever/systemic symptoms.  - DG Foot Complete Left; Future - I will notify him of results and additional treatment  Clarene Reamer, FNP-BC  [facility]  07/13/2018 8:16  AM

## 2018-07-13 NOTE — Patient Instructions (Signed)
I will notify you of results later today

## 2018-07-14 ENCOUNTER — Other Ambulatory Visit: Payer: Self-pay | Admitting: *Deleted

## 2018-07-17 NOTE — Progress Notes (Signed)
Agree. Thanks

## 2018-07-28 DIAGNOSIS — R569 Unspecified convulsions: Secondary | ICD-10-CM | POA: Diagnosis not present

## 2018-08-17 ENCOUNTER — Ambulatory Visit (INDEPENDENT_AMBULATORY_CARE_PROVIDER_SITE_OTHER): Payer: Medicare Other | Admitting: *Deleted

## 2018-08-17 DIAGNOSIS — E538 Deficiency of other specified B group vitamins: Secondary | ICD-10-CM

## 2018-08-17 MED ORDER — CYANOCOBALAMIN 1000 MCG/ML IJ SOLN
1000.0000 ug | Freq: Once | INTRAMUSCULAR | Status: AC
  Administered 2018-08-17: 1000 ug via INTRAMUSCULAR

## 2018-08-17 NOTE — Progress Notes (Signed)
Per orders of Dr. Duncan, injection of Vitamin B12 given by Lisa-Marie Rueger Simpson. Patient tolerated injection well.  

## 2018-08-30 ENCOUNTER — Telehealth: Payer: Self-pay

## 2018-08-30 DIAGNOSIS — M109 Gout, unspecified: Secondary | ICD-10-CM

## 2018-08-30 NOTE — Telephone Encounter (Signed)
Please let him know that I sent in prescription. If symptoms return, please follow up with PCP.

## 2018-08-30 NOTE — Telephone Encounter (Signed)
Pt was seen 07/04/18 and 07/13/18 with gout; pt symptoms completely went away but lt big toe has started to get red with slight swelling and minimal pain. Pt requesting refill prednisone 20 mg to [facility] [address].Please advise. Pt said 08/31/18 would be OK for refill; pt said no major symptoms yet.

## 2018-08-31 NOTE — Telephone Encounter (Signed)
Called and spoke with patient, understanding verbalized nothing further needed at time.

## 2018-09-20 ENCOUNTER — Ambulatory Visit (INDEPENDENT_AMBULATORY_CARE_PROVIDER_SITE_OTHER): Payer: Medicare Other | Admitting: Emergency Medicine

## 2018-09-20 DIAGNOSIS — E538 Deficiency of other specified B group vitamins: Secondary | ICD-10-CM | POA: Diagnosis not present

## 2018-09-20 MED ORDER — CYANOCOBALAMIN 1000 MCG/ML IJ SOLN
1000.0000 ug | Freq: Once | INTRAMUSCULAR | Status: AC
  Administered 2018-09-20: 1000 ug via INTRAMUSCULAR

## 2018-09-20 NOTE — Progress Notes (Signed)
Per orders of Dr.Duncan , injection of B12 given by Elmon Kirschner. Patient tolerated injection well. Patient received in left deltoid.

## 2018-09-20 NOTE — Telephone Encounter (Signed)
Did rx get sent in?

## 2018-09-20 NOTE — Telephone Encounter (Signed)
Doesn't look like prescription was ever sent to Pharmacy.

## 2018-09-21 NOTE — Telephone Encounter (Signed)
That was over 3 weeks ago. Please call him and find out if he is still having symptoms.

## 2018-09-23 NOTE — Progress Notes (Signed)
Agree. Thanks

## 2018-09-23 NOTE — Telephone Encounter (Signed)
Called and left Vm for pt to return call to office.

## 2018-10-25 ENCOUNTER — Ambulatory Visit (INDEPENDENT_AMBULATORY_CARE_PROVIDER_SITE_OTHER): Payer: Medicare Other | Admitting: *Deleted

## 2018-10-25 DIAGNOSIS — E538 Deficiency of other specified B group vitamins: Secondary | ICD-10-CM

## 2018-10-25 MED ORDER — CYANOCOBALAMIN 1000 MCG/ML IJ SOLN
1000.0000 ug | Freq: Once | INTRAMUSCULAR | Status: AC
  Administered 2018-10-25: 1000 ug via INTRAMUSCULAR

## 2018-10-25 NOTE — Progress Notes (Signed)
Per orders of Dr. Damita Dunnings, injection of b12 given by Modena Nunnery. Patient tolerated injection well.

## 2018-10-27 ENCOUNTER — Telehealth: Payer: Self-pay | Admitting: Family Medicine

## 2018-10-27 NOTE — Telephone Encounter (Signed)
Lab appointment 3/12 cpx 3/16 Pt didn't want to see lisa for medicare wellness    Tried calling pt voicemail full  night, Azzie Almas, CMA  Ramond Craver B      Previous Messages    ----- Message -----  From: [name], MD  Sent: 10/26/2018  3:45 PM EST  To: Modena Nunnery, CMA   He is due for a yearly medicare visit in 12/2018 or later. Please schedule when possible. Thanks.

## 2018-11-29 ENCOUNTER — Ambulatory Visit (INDEPENDENT_AMBULATORY_CARE_PROVIDER_SITE_OTHER): Payer: Medicare Other | Admitting: *Deleted

## 2018-11-29 DIAGNOSIS — E538 Deficiency of other specified B group vitamins: Secondary | ICD-10-CM | POA: Diagnosis not present

## 2018-11-29 MED ORDER — CYANOCOBALAMIN 1000 MCG/ML IJ SOLN
1000.0000 ug | Freq: Once | INTRAMUSCULAR | Status: AC
  Administered 2018-11-29: 1000 ug via INTRAMUSCULAR

## 2018-11-29 NOTE — Progress Notes (Signed)
Per orders of Dr. Duncan, injection of Vitamin B 12 given by Regina Laws. Patient tolerated injection well.  

## 2018-12-14 ENCOUNTER — Telehealth: Payer: Self-pay

## 2018-12-14 NOTE — Telephone Encounter (Signed)
[place] Night - Client Nonclinical Telephone Record [facility] Night - Client Client Site [place] - Night Contact Type Call Who Is Calling Patient / Member / Family / Caregiver Caller Name [place] Phone Number [phone] or [phone] Patient Name Justin Ford Patient DOB Jan 19, 1930 Call Type Message Only Information Provided Reason for Call Request for General Office Information Initial Comment Caller is wanting to cancel his appts. Additional Comment Caller wants to cancel all appts right now. Call Closed By: Felecia Jan Transaction Date/Time: 12/14/2018 7:58:38 AM (ET)

## 2018-12-14 NOTE — Telephone Encounter (Signed)
I spoke with pt and cancelled upcoming wellness. Pt said he will call back to reschedule when coronvirus scare is over.

## 2018-12-15 ENCOUNTER — Other Ambulatory Visit: Payer: Self-pay | Admitting: Family Medicine

## 2018-12-15 ENCOUNTER — Other Ambulatory Visit: Payer: Medicare Other

## 2018-12-15 DIAGNOSIS — E538 Deficiency of other specified B group vitamins: Secondary | ICD-10-CM

## 2018-12-15 DIAGNOSIS — E785 Hyperlipidemia, unspecified: Secondary | ICD-10-CM

## 2018-12-19 ENCOUNTER — Encounter: Payer: Medicare Other | Admitting: Family Medicine

## 2019-01-03 ENCOUNTER — Ambulatory Visit: Payer: Medicare Other

## 2019-01-15 ENCOUNTER — Other Ambulatory Visit: Payer: Self-pay | Admitting: Family Medicine

## 2019-03-13 DIAGNOSIS — D2271 Melanocytic nevi of right lower limb, including hip: Secondary | ICD-10-CM | POA: Diagnosis not present

## 2019-03-13 DIAGNOSIS — D2272 Melanocytic nevi of left lower limb, including hip: Secondary | ICD-10-CM | POA: Diagnosis not present

## 2019-03-13 DIAGNOSIS — D2261 Melanocytic nevi of right upper limb, including shoulder: Secondary | ICD-10-CM | POA: Diagnosis not present

## 2019-03-13 DIAGNOSIS — D2262 Melanocytic nevi of left upper limb, including shoulder: Secondary | ICD-10-CM | POA: Diagnosis not present

## 2019-03-13 DIAGNOSIS — D225 Melanocytic nevi of trunk: Secondary | ICD-10-CM | POA: Diagnosis not present

## 2019-03-13 DIAGNOSIS — Z08 Encounter for follow-up examination after completed treatment for malignant neoplasm: Secondary | ICD-10-CM | POA: Diagnosis not present

## 2019-03-13 DIAGNOSIS — Z85828 Personal history of other malignant neoplasm of skin: Secondary | ICD-10-CM | POA: Diagnosis not present

## 2019-03-23 ENCOUNTER — Telehealth: Payer: Self-pay | Admitting: Family Medicine

## 2019-03-23 DIAGNOSIS — H9190 Unspecified hearing loss, unspecified ear: Secondary | ICD-10-CM

## 2019-03-23 NOTE — Telephone Encounter (Signed)
Best number [phone] Pt wanted to know if dr Damita Dunnings will refer him to [facility] for hearing test

## 2019-03-24 NOTE — Telephone Encounter (Signed)
Ordered. Thanks

## 2019-03-30 DIAGNOSIS — H903 Sensorineural hearing loss, bilateral: Secondary | ICD-10-CM | POA: Diagnosis not present

## 2019-04-19 ENCOUNTER — Other Ambulatory Visit (INDEPENDENT_AMBULATORY_CARE_PROVIDER_SITE_OTHER): Payer: Medicare Other

## 2019-04-19 DIAGNOSIS — E538 Deficiency of other specified B group vitamins: Secondary | ICD-10-CM | POA: Diagnosis not present

## 2019-04-19 DIAGNOSIS — E785 Hyperlipidemia, unspecified: Secondary | ICD-10-CM

## 2019-04-19 LAB — COMPREHENSIVE METABOLIC PANEL
ALT: 13 U/L (ref 0–53)
AST: 14 U/L (ref 0–37)
Albumin: 4.2 g/dL (ref 3.5–5.2)
Alkaline Phosphatase: 83 U/L (ref 39–117)
BUN: 29 mg/dL — ABNORMAL HIGH (ref 6–23)
CO2: 27 mEq/L (ref 19–32)
Calcium: 9.2 mg/dL (ref 8.4–10.5)
Chloride: 109 mEq/L (ref 96–112)
Creatinine, Ser: 1.72 mg/dL — ABNORMAL HIGH (ref 0.40–1.50)
GFR: 37.82 mL/min — ABNORMAL LOW (ref >60.00)
Glucose, Bld: 98 mg/dL (ref 70–99)
Potassium: 4.6 mEq/L (ref 3.5–5.1)
Sodium: 141 mEq/L (ref 135–145)
Total Bilirubin: 0.7 mg/dL (ref 0.2–1.2)
Total Protein: 7.4 g/dL (ref 6.0–8.3)

## 2019-04-19 LAB — LIPID PANEL
Cholesterol: 125 mg/dL (ref 0–200)
HDL: 53.6 mg/dL (ref >39.00)
LDL Cholesterol: 59 mg/dL (ref 0–99)
NonHDL: 71.08
Total CHOL/HDL Ratio: 2
Triglycerides: 59 mg/dL (ref 0.0–149.0)
VLDL: 11.8 mg/dL (ref 0.0–40.0)

## 2019-04-19 LAB — VITAMIN B12: Vitamin B-12: 348 pg/mL (ref 211–911)

## 2019-04-25 ENCOUNTER — Ambulatory Visit (INDEPENDENT_AMBULATORY_CARE_PROVIDER_SITE_OTHER): Payer: Medicare Other | Admitting: Family Medicine

## 2019-04-25 ENCOUNTER — Encounter: Payer: Self-pay | Admitting: Family Medicine

## 2019-04-25 ENCOUNTER — Telehealth: Payer: Self-pay

## 2019-04-25 DIAGNOSIS — E785 Hyperlipidemia, unspecified: Secondary | ICD-10-CM

## 2019-04-25 DIAGNOSIS — Z0000 Encounter for general adult medical examination without abnormal findings: Secondary | ICD-10-CM | POA: Diagnosis not present

## 2019-04-25 DIAGNOSIS — Z789 Other specified health status: Secondary | ICD-10-CM

## 2019-04-25 DIAGNOSIS — E538 Deficiency of other specified B group vitamins: Secondary | ICD-10-CM | POA: Diagnosis not present

## 2019-04-25 DIAGNOSIS — I10 Essential (primary) hypertension: Secondary | ICD-10-CM

## 2019-04-25 DIAGNOSIS — K2270 Barrett's esophagus without dysplasia: Secondary | ICD-10-CM | POA: Diagnosis not present

## 2019-04-25 MED ORDER — CLOPIDOGREL BISULFATE 75 MG PO TABS: 75.0000 mg | ORAL_TABLET | Freq: Every day | ORAL | 3 refills | Status: DC

## 2019-04-25 MED ORDER — PANTOPRAZOLE SODIUM 40 MG PO TBEC: 40.0000 mg | DELAYED_RELEASE_TABLET | Freq: Every day | ORAL | 3 refills | Status: DC

## 2019-04-25 MED ORDER — LISINOPRIL 20 MG PO TABS: 20.0000 mg | ORAL_TABLET | Freq: Every day | ORAL | 3 refills | Status: DC

## 2019-04-25 MED ORDER — ROSUVASTATIN CALCIUM 20 MG PO TABS: 20.0000 mg | ORAL_TABLET | Freq: Every day | ORAL | 3 refills | Status: DC

## 2019-04-25 NOTE — Telephone Encounter (Signed)
Pt did not answer cell phone and no v/m set up. Home line rings busy. Pt has appt 04/25/19 at 2:15 with Dr Damita Dunnings and is listed as phone visit.

## 2019-04-25 NOTE — Progress Notes (Signed)
Interactive audio and video telecommunications were attempted between this provider and patient, however failed, due to patient having technical difficulties OR patient did not have access to video capability.  We continued and completed visit with audio only.   Virtual Visit via Telephone Note  I connected with patient on 04/25/19  at 2:49 PM  by telephone and verified that I am speaking with the correct person using two identifiers.  Location of patient: home.    Location of MD: [facility] Name of referring provider (if blank then none associated): Names per persons and role in encounter:  MD: Earlyne Iba, Patient: name listed above.    I discussed the limitations, risks, security and privacy concerns of performing an evaluation and management service by telephone and the availability of in person appointments. I also discussed with the patient that there may be a patient responsible charge related to this service. The patient expressed understanding and agreed to proceed.  CC AMW visit.   History of Present Illness:  I have personally reviewed the Medicare Annual Wellness questionnaire and have noted 1. The patient's medical and social history 2. Their use of alcohol, tobacco or illicit drugs 3. Their current medications and supplements 4. The patient's functional ability including ADL's, fall risks, home safety risks and hearing or visual             impairment. 5. Diet and physical activities 6. Evidence for depression or mood disorders  The patients weight, height, BMI have been recorded in the chart and visual acuity is per eye clinic.  I have made referrals, counseling and provided education to the patient based review of the above and I have provided the pt with a written personalized care plan for preventive services.  Provider list updated- see scanned forms.  Routine anticipatory guidance given to patient.  See health maintenance. The possibility exists that  previously documented standard health maintenance information may have been brought forward from a previous encounter into this note.  If needed, that same information has been updated to reflect the current situation based on today's encounter.    Flu done in the fall yearly. Shingles 2013 PNA up-to-date Tetanus 2013 Colon cancer screening deferred given his age.  He agrees. Prostate cancer screening deferred given his age.  He agrees. Advance directive- daughter Darliss Cheney designated if patient were incapacitated.  Cognitive function addressed- see scanned forms- and if abnormal then additional documentation follows.   Hypertension:    Using medication without problems or lightheadedness: yes Chest pain with exertion:no Edema:no Short of breath:no  Elevated Cholesterol: Using medications without problems: yes Muscle aches: not likely from statin, some occ leg aches but that is episodic.  No claudication.   Diet compliance: yes Exercise: limited  Labs d/w pt.    Cr elevation.  He isn't taking nsaids.  Discussed with patient about recheck when well hydrated.  He agrees.  We will set up a lab visit.  Barrett's esophagus on PPI.  No GERD sx.  Swallowing well. No ADE on med.    B12 def.  No recent tx given pandemic.  Needs follow-up B12 injection set up a nurse visit.  We will work on scheduling this at the same time of his labs.  Discussed.  He agrees.  PMH and SH reviewed  Meds, vitals, and allergies reviewed.   ROS: Per HPI.  Unless specifically indicated otherwise in HPI, the patient denies:  General: fever. Eyes: acute vision changes ENT: sore throat Cardiovascular:  chest pain Respiratory: SOB GI: vomiting GU: dysuria Musculoskeletal: acute back pain Derm: acute rash Neuro: acute motor dysfunction Psych: worsening mood Endocrine: polydipsia Heme: bleeding Allergy: hayfever   Observations/Objective: nad Speech wnl.   Assessment and Plan:  Flu done in the  fall yearly. Shingles 2013 PNA up-to-date Tetanus 2013 Colon cancer screening deferred given his age.  He agrees. Prostate cancer screening deferred given his age.  He agrees. Advance directive- daughter Darliss Cheney designated if patient were incapacitated.  Cognitive function addressed- see scanned forms- and if abnormal then additional documentation follows.   Hypertension:    Using medication without problems or lightheadedness: yes Chest pain with exertion:no Edema:no Short of breath:no  Elevated Cholesterol: Using medications without problems: yes Muscle aches: not likely from statin, some occ leg aches but that is episodic.  No claudication.   Diet compliance: yes Exercise: limited  Labs d/w pt.    Cr elevation.  He isn't taking nsaids.  Discussed with patient about recheck when well hydrated.  He agrees.  We will set up a lab visit.  Barrett's esophagus on PPI.  No GERD sx.  Swallowing well. No ADE on med.    B12 def.  No recent tx given pandemic.  Needs follow-up B12 injection set up a nurse visit.  We will work on scheduling this at the same time of his labs.  Discussed.  He agrees.  Follow Up Instructions: See above.  I discussed the assessment and treatment plan with the patient. The patient was provided an opportunity to ask questions and all were answered. The patient agreed with the plan and demonstrated an understanding of the instructions.   The patient was advised to call back or seek an in-person evaluation if the symptoms worsen or if the condition fails to improve as anticipated.  I provided 25 minutes of non-face-to-face time during this encounter.  Elsie Stain, MD

## 2019-04-25 NOTE — Telephone Encounter (Signed)
Patient advised.

## 2019-04-25 NOTE — Telephone Encounter (Signed)
[place] Night - Client Nonclinical Telephone Record [facility] Client [place] Night - Client Client Site [facility] Physician Renford Dills - MD Contact Type Call Who Is Calling Patient / Member / Family / Caregiver Caller Name [place] Phone Number [phone] Patient Name Justin Ford Patient DOB April 24, 1930 Call Type Message Only Information Provided Reason for Call Request for General Office Information Initial Comment Caller states he needs to know if his appointment for today is over the phone or in person. Additional Comment Provided office hours. Call Closed By: Lamont Dowdy Transaction Date/Time: 04/25/2019 8:03:22 AM (ET)

## 2019-04-27 NOTE — Assessment & Plan Note (Signed)
Flu done in the fall yearly. Shingles 2013 PNA up-to-date Tetanus 2013 Colon cancer screening deferred given his age.  He agrees. Prostate cancer screening deferred given his age.  He agrees. Advance directive- daughter Darliss Cheney designated if patient were incapacitated.  Cognitive function addressed- see scanned forms- and if abnormal then additional documentation follows.

## 2019-04-27 NOTE — Assessment & Plan Note (Signed)
No recent tx given pandemic.  Needs follow-up B12 injection set up a nurse visit.  We will work on scheduling this at the same time of his labs.  Discussed.  He agrees.

## 2019-04-27 NOTE — Assessment & Plan Note (Signed)
Advance directive- daughter Darliss Cheney designated if patient were incapacitated.

## 2019-04-27 NOTE — Assessment & Plan Note (Signed)
He does not have typical claudication and he does not have severe aches.  The aches that he does have may not be related to statin.  Would continue as is for now.  He agrees.

## 2019-04-27 NOTE — Assessment & Plan Note (Signed)
Continue PPI.  He agrees.

## 2019-04-27 NOTE — Assessment & Plan Note (Signed)
No change in meds at this point.  Continue as is.  See discussion above regarding follow-up creatinine check.  We will get that done and go from there.  He agrees.  He is avoiding NSAIDs.

## 2019-05-24 ENCOUNTER — Ambulatory Visit (INDEPENDENT_AMBULATORY_CARE_PROVIDER_SITE_OTHER): Payer: Medicare Other

## 2019-05-24 ENCOUNTER — Other Ambulatory Visit: Payer: Self-pay

## 2019-05-24 ENCOUNTER — Other Ambulatory Visit (INDEPENDENT_AMBULATORY_CARE_PROVIDER_SITE_OTHER): Payer: Medicare Other

## 2019-05-24 DIAGNOSIS — I10 Essential (primary) hypertension: Secondary | ICD-10-CM

## 2019-05-24 DIAGNOSIS — E538 Deficiency of other specified B group vitamins: Secondary | ICD-10-CM | POA: Diagnosis not present

## 2019-05-24 LAB — BASIC METABOLIC PANEL
BUN: 25 mg/dL — ABNORMAL HIGH (ref 6–23)
CO2: 25 mEq/L (ref 19–32)
Calcium: 9.4 mg/dL (ref 8.4–10.5)
Chloride: 110 mEq/L (ref 96–112)
Creatinine, Ser: 1.74 mg/dL — ABNORMAL HIGH (ref 0.40–1.50)
GFR: 37.31 mL/min — ABNORMAL LOW (ref >60.00)
Glucose, Bld: 108 mg/dL — ABNORMAL HIGH (ref 70–99)
Potassium: 4.8 mEq/L (ref 3.5–5.1)
Sodium: 140 mEq/L (ref 135–145)

## 2019-05-24 MED ORDER — CYANOCOBALAMIN 1000 MCG/ML IJ SOLN
1000.0000 ug | Freq: Once | INTRAMUSCULAR | Status: AC
  Administered 2019-05-24: 1000 ug via INTRAMUSCULAR

## 2019-05-24 NOTE — Progress Notes (Signed)
Per orders of Dr. Leonard Downing signed by Dr Glori Bickers in his absence, injection of monthly B12 injection given by Kris Mouton. Patient tolerated injection well.

## 2019-05-29 ENCOUNTER — Other Ambulatory Visit: Payer: Self-pay | Admitting: Family Medicine

## 2019-05-29 DIAGNOSIS — I10 Essential (primary) hypertension: Secondary | ICD-10-CM

## 2019-06-13 DIAGNOSIS — Z23 Encounter for immunization: Secondary | ICD-10-CM | POA: Diagnosis not present

## 2019-06-29 DIAGNOSIS — R569 Unspecified convulsions: Secondary | ICD-10-CM | POA: Diagnosis not present

## 2019-07-11 ENCOUNTER — Telehealth: Payer: Self-pay | Admitting: Family Medicine

## 2019-07-11 NOTE — Telephone Encounter (Signed)
Patient wants to call and find out if he needs to resume his B-12 shots.  Patient got his flu shot at [facility] in September.  Patient wants to know if he needs to get Shingrix and Pneumonia shot.

## 2019-07-11 NOTE — Telephone Encounter (Signed)
Flu shot record updated. Please review

## 2019-07-12 ENCOUNTER — Telehealth: Payer: Self-pay | Admitting: Family Medicine

## 2019-07-12 DIAGNOSIS — M109 Gout, unspecified: Secondary | ICD-10-CM

## 2019-07-12 NOTE — Telephone Encounter (Signed)
Please set up follow-up B12 injection if he is not going to do it at home. Up to date on pneumonia shot.  He can check with his insurance to see if they cover the Shingrix shot.  It may be cheaper at the pharmacy. Thanks.

## 2019-07-12 NOTE — Telephone Encounter (Signed)
Patient called and said he has gout in his big toe.  Patient wants to know if Dr.Duncan will send a rx for medication.  Patient uses [facility].  Patient said he'd rather not come in for an office visit.

## 2019-07-13 MED ORDER — PREDNISONE 10 MG PO TABS: ORAL_TABLET | ORAL | 0 refills | Status: DC

## 2019-07-13 NOTE — Telephone Encounter (Signed)
Patient called back and said his toe is redder and he can't put a shoe on.

## 2019-07-13 NOTE — Telephone Encounter (Signed)
Pt informed that Rx has been sent and to follow up  With [place] if not better.

## 2019-07-13 NOTE — Telephone Encounter (Signed)
rx sent.  I just saw this message.  Update [place] if not better.   Take prednisone with food.  Thanks.

## 2019-07-13 NOTE — Telephone Encounter (Signed)
Please see message and advise.  Thank you. ° °

## 2019-07-14 NOTE — Telephone Encounter (Signed)
I called to inform patient of below. He is scheduled for B-12 injection 10/15. He will see about getting shingrix from pharmacy.

## 2019-07-20 ENCOUNTER — Ambulatory Visit (INDEPENDENT_AMBULATORY_CARE_PROVIDER_SITE_OTHER): Payer: Medicare Other

## 2019-07-20 ENCOUNTER — Other Ambulatory Visit: Payer: Self-pay

## 2019-07-20 DIAGNOSIS — E538 Deficiency of other specified B group vitamins: Secondary | ICD-10-CM

## 2019-07-20 MED ORDER — CYANOCOBALAMIN 1000 MCG/ML IJ SOLN
1000.0000 ug | Freq: Once | INTRAMUSCULAR | Status: AC
  Administered 2019-07-20: 1000 ug via INTRAMUSCULAR

## 2019-07-20 NOTE — Progress Notes (Signed)
Per orders of Dr. Damita Dunnings, injection of B12 given in right deltoid by Lurlean Nanny.  Patient tolerated injection well.

## 2019-08-29 ENCOUNTER — Other Ambulatory Visit: Payer: Self-pay

## 2020-02-19 ENCOUNTER — Ambulatory Visit (INDEPENDENT_AMBULATORY_CARE_PROVIDER_SITE_OTHER): Payer: Medicare Other | Admitting: Gastroenterology

## 2020-02-19 ENCOUNTER — Other Ambulatory Visit: Payer: Self-pay

## 2020-02-19 ENCOUNTER — Encounter: Payer: Self-pay | Admitting: Gastroenterology

## 2020-02-19 VITALS — BP 138/73 | HR 113 | Temp 97.7°F | Wt 171.6 lb

## 2020-02-19 DIAGNOSIS — K921 Melena: Secondary | ICD-10-CM | POA: Diagnosis not present

## 2020-02-20 LAB — FERRITIN: Ferritin: 49 ng/mL (ref 30–400)

## 2020-02-20 LAB — IRON AND TIBC
Iron Saturation: 21 % (ref 15–55)
Iron: 83 ug/dL (ref 38–169)
Total Iron Binding Capacity: 387 ug/dL (ref 250–450)
UIBC: 304 ug/dL (ref 111–343)

## 2020-02-20 LAB — HEMOGLOBIN: Hemoglobin: 13.3 g/dL (ref 13.0–17.7)

## 2020-02-20 NOTE — Progress Notes (Signed)
Justin Ford [address]  [place]  [place], [place] [address]  Main: [phone]  Fax: [phone]   Gastroenterology Consultation  Referring Provider:     [name], Justin Ford Primary Care Physician:  [name], Justin Ford Reason for Consultation:   Melena        HPI:    Chief Complaint  Patient presents with  . Melena    Patient stated that for the past two months he had noticed dark stools but it is gone now.    Justin Ford is a 86 y.o. y/o male referred for consultation & management  by Justin Ford, Justin Rising, Justin Ford.  Patient reports having black stools about 2 to 3 weeks ago and it lasted for about 1 whole week.  He reports it looked like black tar.  No abdominal pain during this episode.  However this resolved and patient is now having brown stool.  No prior history of similar symptoms.  No nausea vomiting or hematemesis.  Colonoscopy in 2008 with three 7 mm polyps removed, pathology report not available  Past Medical History:  Diagnosis Date  . Barrett's esophagus   . Blood in stool    colonoscopy done ~2010  . Cancer ([place])    basal cell CA per derm  . GERD (gastroesophageal reflux disease)   . Hyperlipidemia   . Hypertension   . Kidney stones   . TIA (transient ischemic attack)    2016    Past Surgical History:  Procedure Laterality Date  . PROSTATE BIOPSY     neg x2 per patient  . SHOULDER SURGERY     per Dr. Theda Sers, R shoulder    Prior to Admission medications   Medication Sig Start Date End Date Taking? Authorizing Provider  clopidogrel (PLAVIX) 75 MG tablet Take 1 tablet (75 mg total) by mouth daily. 04/25/19  Yes [name], Justin Ford  lamoTRIgine (LAMICTAL) 25 MG tablet Take 1 tablet by mouth at bedtime. 08/18/18  Yes Provider, Historical, Justin Ford  lisinopril (ZESTRIL) 20 MG tablet Take 1 tablet (20 mg total) by mouth daily. 04/25/19  Yes [name], Justin Ford  rosuvastatin (CRESTOR) 20 MG tablet Take 1 tablet (20 mg total) by mouth daily.  04/25/19  Yes [name], Justin Ford    Family History  Problem Relation Age of Onset  . Dementia Mother   . Alcohol abuse Father   . Heart disease Brother   . Cancer Sister        possible breast cancer, pt was unsure  . Colon cancer Neg Hx   . Prostate cancer Neg Hx      Social History   Tobacco Use  . Smoking status: Former Smoker    Packs/day: 0.25    Years: 4.00    Pack years: 1.00    Quit date: 10/05/1950    Years since quitting: 69.4  . Smokeless tobacco: Never Used  Substance Use Topics  . Alcohol use: Not Currently    Alcohol/week: 2.0 standard drinks    Types: 2 Cans of beer per week  . Drug use: No    Allergies as of 02/19/2020 - Review Complete 02/19/2020  Allergen Reaction Noted  . Lipitor [atorvastatin] Other (See Comments) 03/15/2012    Review of Systems:    All systems reviewed and negative except where noted in HPI.   Physical Exam:  BP 138/73   Pulse (!) 113   Temp 97.7 F (36.5 C) (Oral)   Wt 171  lb 9.6 oz (77.8 kg)   BMI 26.09 kg/m  No LMP for male patient. Psych:  Alert and cooperative. Normal mood and affect. General:   Alert,  Well-developed, well-nourished, pleasant and cooperative in NAD Head:  Normocephalic and atraumatic. Eyes:  Sclera clear, no icterus.   Conjunctiva pink. Ears:  Normal auditory acuity. Nose:  No deformity, discharge, or lesions. Mouth:  No deformity or lesions,oropharynx pink & moist. Neck:  Supple; no masses or thyromegaly. Abdomen:  Normal bowel sounds.  No bruits.  Soft, non-tender and non-distended without masses, hepatosplenomegaly or hernias noted.  No guarding or rebound tenderness.    Msk:  Symmetrical without gross deformities. Good, equal movement & strength bilaterally. Pulses:  Normal pulses noted. Extremities:  No clubbing or edema.  No cyanosis. Neurologic:  Alert and oriented x3;  grossly normal neurologically. Skin:  Intact without significant lesions or rashes. No jaundice. Lymph Nodes:  No  significant cervical adenopathy. Psych:  Alert and cooperative. Normal mood and affect.   Labs: CBC    Component Value Date/Time   WBC 5.6 05/20/2016 1921   RBC 4.57 05/20/2016 1921   HGB 13.3 02/19/2020 1640   HCT 41.8 05/20/2016 1921   PLT 171.0 05/20/2016 1921   MCV 91.4 05/20/2016 1921   MCH 30.8 03/06/2016 1601   MCHC 34.1 05/20/2016 1921   RDW 14.0 05/20/2016 1921   LYMPHSABS 1.4 05/20/2016 1921   MONOABS 0.4 05/20/2016 1921   EOSABS 0.2 05/20/2016 1921   BASOSABS 0.1 05/20/2016 1921   CMP     Component Value Date/Time   NA 140 05/24/2019 0826   K 4.8 05/24/2019 0826   K 4.2 08/02/2014 1605   CL 110 05/24/2019 0826   CO2 25 05/24/2019 0826   GLUCOSE 108 (H) 05/24/2019 0826   BUN 25 (H) 05/24/2019 0826   CREATININE 1.74 (H) 05/24/2019 0826   CREATININE 1.51 (H) 03/06/2016 1601   CALCIUM 9.4 05/24/2019 0826   PROT 7.4 04/19/2019 0847   ALBUMIN 4.2 04/19/2019 0847   AST 14 04/19/2019 0847   ALT 13 04/19/2019 0847   ALKPHOS 83 04/19/2019 0847   BILITOT 0.7 04/19/2019 0847   GFRNONAA 40 (L) 09/24/2015 0517   GFRAA 46 (L) 09/24/2015 0517    Imaging Studies: No results found.  Assessment and Plan:   Justin Ford is a 86 y.o. y/o male has been referred for melena  Symptoms have resolved at this time, but will obtain hemoglobin to check if he is anemic  We will also evaluate for any evidence of iron deficiency  Would recommend upper endoscopy for melena.  If patient is acutely anemic, may need to do upper endoscopy sooner than later  If upper endoscopy negative, would recommend colonoscopy as well  Patient's daughter present with him today and we discussed risks and benefits of procedure in detail, keeping in mind his age.  They would like further evaluation with endoscopic procedures at this time but would like to proceed with the procedures one bedtime and that if upper endoscopy negative then proceed with colonoscopy later  I have discussed alternative  options, risks & benefits,  which include, but are not limited to, bleeding, infection, perforation,respiratory complication & drug reaction.  The patient agrees with this plan & written consent will be obtained.    Dr Justin Ford  Speech recognition software was used to dictate the above note.

## 2020-02-21 ENCOUNTER — Telehealth: Payer: Self-pay | Admitting: *Deleted

## 2020-02-21 NOTE — Telephone Encounter (Addendum)
Patient called stating that he is scheduled for an endoscopy 03/07/20. Patient stated that he was told to call his PCP to find out if he is on a blood thinner and if so, see about stopping it prior to his procedure. Patient stated tthe facility that he went to was very disorganized. Patient requested that his daughter be called back with Dr. Josefine Class response. Darliss Cheney [phone]

## 2020-02-22 NOTE — Telephone Encounter (Signed)
Patient called and left a voicemail regarding his blood thinner. Requested that we call his daughter to update.   I called his daughter and advised her that the message has been sent to Dr Damita Dunnings and would be addressed when he returns next week. Aware that I will send again and mark as urgent as his procedure is in 2 weeks. She is aware that we will reach out to her as soon as we are able.

## 2020-02-23 NOTE — Telephone Encounter (Signed)
Left detailed message on voicemail of Daphna (daughter).

## 2020-02-23 NOTE — Telephone Encounter (Signed)
Okay to proceed.  Hold plavix 5 days prior to (and the day of) the procedure.  Restart the next day after procedure assuming he isn't directed o/w by GI clinic.  Thanks.

## 2020-02-23 NOTE — Telephone Encounter (Addendum)
Pt called to get instructions on Plavix prior to procedure... per note below, instructions given and pt expressed understanding

## 2020-02-27 ENCOUNTER — Telehealth: Payer: Self-pay

## 2020-02-27 NOTE — Telephone Encounter (Signed)
Called patient but his daughter-Daphna answered and I told her that her dad was okay to stop his Plavix 5 days prior his procedure and restart it the day after. She understood and had no further questions.

## 2020-02-29 NOTE — Telephone Encounter (Signed)
VM left on Triage line from [facility]? At [facility] asking about a form for surgical clearance to stop his Plavix 5 days prior to the procedure. Asked that it be faxed back to [phone]. I see pt has been advised, but unsure if this form has been done.

## 2020-02-29 NOTE — Telephone Encounter (Signed)
I don't see or recall a specific form about this.  Please call the GI clinic/fax this note.    Okay to proceed.  Hold plavix 5 days prior to (and the day of) the procedure.  Restart the next day after procedure assuming he isn't directed o/w by GI clinic.  Thanks.

## 2020-02-29 NOTE — Telephone Encounter (Signed)
Called and LM for GI to return our call.

## 2020-03-01 ENCOUNTER — Telehealth: Payer: Self-pay

## 2020-03-01 NOTE — Telephone Encounter (Signed)
Received fax access nurse note pt wanting to know if Clopidogrel is a blood thinner and then call was cancelled by pts request. I called pt and spoke with Rodey (DPR signed) and she was unaware of pts call and said that sometimes he forgets what meds are for but Daphna said pt is fine and does not need anything at this time. FYI to Dr Damita Dunnings.  The access nurse did not come thru the portal so will send copy for scanning into pts chart and will put copy in Dr Josefine Class in box for review.

## 2020-03-01 NOTE — Telephone Encounter (Signed)
Thanks

## 2020-03-01 NOTE — Telephone Encounter (Signed)
Noted. Thanks.

## 2020-03-01 NOTE — Telephone Encounter (Signed)
Gi returned call Advised of Dr Josefine Class message below; They stated they understood. And will proceed

## 2020-03-01 NOTE — Telephone Encounter (Signed)
Noted, FYI

## 2020-03-05 ENCOUNTER — Other Ambulatory Visit
Admission: RE | Admit: 2020-03-05 | Discharge: 2020-03-05 | Disposition: A | Discharge status: Home / Self Care | Payer: Medicare Other | Source: Ambulatory Visit | Attending: Gastroenterology | Admitting: Gastroenterology

## 2020-03-05 ENCOUNTER — Other Ambulatory Visit: Payer: Self-pay

## 2020-03-05 DIAGNOSIS — Z01812 Encounter for preprocedural laboratory examination: Secondary | ICD-10-CM | POA: Insufficient documentation

## 2020-03-05 DIAGNOSIS — Z20822 Contact with and (suspected) exposure to covid-19: Secondary | ICD-10-CM | POA: Diagnosis not present

## 2020-03-05 LAB — SARS CORONAVIRUS 2 (TAT 6-24 HRS): SARS Coronavirus 2: NEGATIVE

## 2020-03-07 ENCOUNTER — Encounter
Admission: RE | Disposition: A | Discharge status: Home / Self Care | Payer: Self-pay | Source: Home / Self Care | Attending: Gastroenterology

## 2020-03-07 ENCOUNTER — Encounter: Payer: Self-pay | Admitting: Gastroenterology

## 2020-03-07 ENCOUNTER — Ambulatory Visit
Admission: RE | Admit: 2020-03-07 | Discharge: 2020-03-07 | Disposition: A | Discharge status: Home / Self Care | Payer: Medicare Other | Attending: Gastroenterology | Admitting: Gastroenterology

## 2020-03-07 ENCOUNTER — Ambulatory Visit: Payer: Medicare Other | Admitting: Anesthesiology

## 2020-03-07 DIAGNOSIS — E785 Hyperlipidemia, unspecified: Secondary | ICD-10-CM | POA: Insufficient documentation

## 2020-03-07 DIAGNOSIS — K3189 Other diseases of stomach and duodenum: Secondary | ICD-10-CM | POA: Diagnosis not present

## 2020-03-07 DIAGNOSIS — K219 Gastro-esophageal reflux disease without esophagitis: Secondary | ICD-10-CM | POA: Diagnosis not present

## 2020-03-07 DIAGNOSIS — K2270 Barrett's esophagus without dysplasia: Secondary | ICD-10-CM | POA: Insufficient documentation

## 2020-03-07 DIAGNOSIS — Z85828 Personal history of other malignant neoplasm of skin: Secondary | ICD-10-CM | POA: Insufficient documentation

## 2020-03-07 DIAGNOSIS — Z809 Family history of malignant neoplasm, unspecified: Secondary | ICD-10-CM | POA: Diagnosis not present

## 2020-03-07 DIAGNOSIS — Z79899 Other long term (current) drug therapy: Secondary | ICD-10-CM | POA: Insufficient documentation

## 2020-03-07 DIAGNOSIS — K921 Melena: Secondary | ICD-10-CM

## 2020-03-07 DIAGNOSIS — K31819 Angiodysplasia of stomach and duodenum without bleeding: Secondary | ICD-10-CM | POA: Diagnosis not present

## 2020-03-07 DIAGNOSIS — Z820 Family history of epilepsy and other diseases of the nervous system: Secondary | ICD-10-CM | POA: Diagnosis not present

## 2020-03-07 DIAGNOSIS — I10 Essential (primary) hypertension: Secondary | ICD-10-CM | POA: Diagnosis not present

## 2020-03-07 DIAGNOSIS — Z87891 Personal history of nicotine dependence: Secondary | ICD-10-CM | POA: Insufficient documentation

## 2020-03-07 DIAGNOSIS — Z8249 Family history of ischemic heart disease and other diseases of the circulatory system: Secondary | ICD-10-CM | POA: Diagnosis not present

## 2020-03-07 DIAGNOSIS — Z8673 Personal history of transient ischemic attack (TIA), and cerebral infarction without residual deficits: Secondary | ICD-10-CM | POA: Diagnosis not present

## 2020-03-07 DIAGNOSIS — Z811 Family history of alcohol abuse and dependence: Secondary | ICD-10-CM | POA: Insufficient documentation

## 2020-03-07 DIAGNOSIS — Z888 Allergy status to other drugs, medicaments and biological substances status: Secondary | ICD-10-CM | POA: Insufficient documentation

## 2020-03-07 DIAGNOSIS — Z87442 Personal history of urinary calculi: Secondary | ICD-10-CM | POA: Diagnosis not present

## 2020-03-07 HISTORY — PX: ESOPHAGOGASTRODUODENOSCOPY (EGD) WITH PROPOFOL: SHX5813

## 2020-03-07 SURGERY — ESOPHAGOGASTRODUODENOSCOPY (EGD) WITH PROPOFOL
Anesthesia: General

## 2020-03-07 MED ORDER — GLYCOPYRROLATE 0.2 MG/ML IJ SOLN
INTRAMUSCULAR | Status: DC | PRN
  Administered 2020-03-07: .2 mg via INTRAVENOUS

## 2020-03-07 MED ORDER — PROPOFOL 500 MG/50ML IV EMUL
INTRAVENOUS | Status: DC | PRN
  Administered 2020-03-07: 145 ug/kg/min via INTRAVENOUS

## 2020-03-07 MED ORDER — PROPOFOL 10 MG/ML IV BOLUS
INTRAVENOUS | Status: DC | PRN
  Administered 2020-03-07: 50 mg via INTRAVENOUS

## 2020-03-07 MED ORDER — LIDOCAINE HCL (CARDIAC) PF 100 MG/5ML IV SOSY
PREFILLED_SYRINGE | INTRAVENOUS | Status: DC | PRN
  Administered 2020-03-07: 100 mg via INTRAVENOUS

## 2020-03-07 MED ORDER — LIDOCAINE HCL (PF) 2 % IJ SOLN
INTRAMUSCULAR | Status: AC
  Filled 2020-03-07: qty 20

## 2020-03-07 MED ORDER — VASOPRESSIN 20 UNIT/ML IV SOLN
INTRAVENOUS | Status: AC
  Filled 2020-03-07: qty 1

## 2020-03-07 MED ORDER — GLYCOPYRROLATE 0.2 MG/ML IJ SOLN
INTRAMUSCULAR | Status: AC
  Filled 2020-03-07: qty 3

## 2020-03-07 MED ORDER — SODIUM CHLORIDE 0.9 % IV SOLN
INTRAVENOUS | Status: DC
  Administered 2020-03-07: 1000 mL via INTRAVENOUS

## 2020-03-07 MED ORDER — PHENYLEPHRINE HCL (PRESSORS) 10 MG/ML IV SOLN
INTRAVENOUS | Status: AC
  Filled 2020-03-07: qty 1

## 2020-03-07 MED ORDER — PROPOFOL 500 MG/50ML IV EMUL
INTRAVENOUS | Status: AC
  Filled 2020-03-07: qty 300

## 2020-03-07 NOTE — H&P (Signed)
Vonda Antigua, MD [address], [place], [place], [place], [address] [address], [place], [place], [place], [address] Phone: [phone]  Fax: [phone]  Primary Care Physician:  [name], MD   Pre-Procedure History & Physical: HPI:  Justin Ford is a 86 y.o. male is here for an EGD.   Past Medical History:  Diagnosis Date  . Barrett's esophagus   . Blood in stool    colonoscopy done ~2010  . Cancer ([place])    basal cell CA per derm  . GERD (gastroesophageal reflux disease)   . Hyperlipidemia   . Hypertension   . Kidney stones   . TIA (transient ischemic attack)    2016    Past Surgical History:  Procedure Laterality Date  . PROSTATE BIOPSY     neg x2 per patient  . SHOULDER SURGERY     per Dr. Theda Sers, R shoulder    Prior to Admission medications   Medication Sig Start Date End Date Taking? Authorizing Provider  lamoTRIgine (LAMICTAL) 25 MG tablet Take 1 tablet by mouth at bedtime. 08/18/18  Yes [provider]  lisinopril (ZESTRIL) 20 MG tablet Take 1 tablet (20 mg total) by mouth daily. 04/25/19  Yes [name], MD  rosuvastatin (CRESTOR) 20 MG tablet Take 1 tablet (20 mg total) by mouth daily. 04/25/19  Yes [name], MD  clopidogrel (PLAVIX) 75 MG tablet Take 1 tablet (75 mg total) by mouth daily. 04/25/19   [name], MD    Allergies as of 02/20/2020 - Review Complete 02/19/2020  Allergen Reaction Noted  . Lipitor [atorvastatin] Other (See Comments) 03/15/2012    Family History  Problem Relation Age of Onset  . Dementia Mother   . Alcohol abuse Father   . Heart disease Brother   . Cancer Sister        possible breast cancer, pt was unsure  . Colon cancer Neg Hx   . Prostate cancer Neg Hx     Social History   Socioeconomic History  . Marital status: Married    Spouse name: Not on file  . Number of children: Not on file  . Years of education: Not on file  . Highest education level: Not on file    Occupational History  . Occupation: retired    Comment: Optometrist  Tobacco Use  . Smoking status: Former Smoker    Packs/day: 0.25    Years: 4.00    Pack years: 1.00    Quit date: 10/05/1950    Years since quitting: 69.4  . Smokeless tobacco: Never Used  Substance and Sexual Activity  . Alcohol use: Not Currently    Alcohol/week: 2.0 standard drinks    Types: 2 Cans of beer per week  . Drug use: No  . Sexual activity: Never  Other Topics Concern  . Not on file  Social History Narrative   Married 1951   From [place].    Retired Optometrist   2 kids, 2 grandkids (grandkids are Radiation protection practitioner)   Social Determinants of Radio broadcast assistant Strain:   . Difficulty of Paying Living Expenses:   Food Insecurity:   . Worried About Charity fundraiser in the Last Year:   . Arboriculturist in the Last Year:   Transportation Needs:   . Film/video editor (Medical):   [name] Lack of Transportation (Non-Medical):   Physical Activity:   . Days of Exercise per  Week:   . Minutes of Exercise per Session:   Stress:   . Feeling of Stress :   Social Connections:   . Frequency of Communication with Friends and Family:   . Frequency of Social Gatherings with Friends and Family:   . Attends Religious Services:   . Active Member of Clubs or Organizations:   . Attends Archivist Meetings:   [name] Marital Status:   Intimate Partner Violence:   . Fear of Current or Ex-Partner:   . Emotionally Abused:   [name] Physically Abused:   . Sexually Abused:     Review of Systems: See HPI, otherwise negative ROS  Physical Exam: BP (!) 155/90   Pulse 77   Temp (!) 96.5 F (35.8 C) (Tympanic)   Resp 16   Ht 5\' 8"  (1.727 m)   Wt 75.8 kg   SpO2 100%   BMI 25.39 kg/m  General:   Alert,  pleasant and cooperative in NAD Head:  Normocephalic and atraumatic. Neck:  Supple; no masses or thyromegaly. Lungs:  Clear throughout to auscultation, normal respiratory  effort.    Heart:  +S1, +S2, Regular rate and rhythm, No edema. Abdomen:  Soft, nontender and nondistended. Normal bowel sounds, without guarding, and without rebound.   Neurologic:  Alert and  oriented x4;  grossly normal neurologically.  Impression/Plan: Justin Ford is here for an EGD for melena Risks, benefits, limitations, and alternatives regarding the procedure have been reviewed with the patient.  Questions have been answered.  All parties agreeable.   Virgel Manifold, MD  03/07/2020, 8:19 AM

## 2020-03-07 NOTE — Anesthesia Procedure Notes (Signed)
Procedure Name: General with mask airway Performed by: Kelton Pillar, CRNA Pre-anesthesia Checklist: Patient identified, Emergency Drugs available, Suction available and Patient being monitored Patient Re-evaluated:Patient Re-evaluated prior to induction Oxygen Delivery Method: Simple face mask Dental Injury: Teeth and Oropharynx as per pre-operative assessment

## 2020-03-07 NOTE — Transfer of Care (Signed)
Immediate Anesthesia Transfer of Care Note  Patient: Justin Ford  Procedure(s) Performed: ESOPHAGOGASTRODUODENOSCOPY (EGD) WITH PROPOFOL (N/A )  Patient Location: Endoscopy Unit  Anesthesia Type:General  Level of Consciousness: drowsy and responds to stimulation  Airway & Oxygen Therapy: Patient Spontanous Breathing and Patient connected to face mask oxygen  Post-op Assessment: Report given to RN and Post -op Vital signs reviewed and stable  Post vital signs: Reviewed and stable  Last Vitals:  Vitals Value Taken Time  BP 121/82 03/07/20 0852  Temp    Pulse 84 03/07/20 0852  Resp 11 03/07/20 0852  SpO2 100 % 03/07/20 0852  Vitals shown include unvalidated device data.  Last Pain:  Vitals:   03/07/20 0735  TempSrc: Tympanic  PainSc: 0-No pain         Complications: No apparent anesthesia complications

## 2020-03-07 NOTE — Op Note (Signed)
[facility] Patient Name: Justin Ford Procedure Date: 03/07/2020 7:35 AM MRN: [id number] Account #: [id number] Date of Birth: 1929-11-04 Admit Type: Outpatient Age: 86 Room: [facility] ENDO ROOM 2 Gender: Male Note Status: Finalized Procedure:             Upper GI endoscopy Indications:           Melena Providers:             Keisa Blow B. Bonna Gains MD, MD Referring MD:          Elveria Rising. Damita Dunnings, MD (Referring MD) Medicines:             Monitored Anesthesia Care Complications:         No immediate complications. Procedure:             Pre-Anesthesia Assessment:                        - The risks and benefits of the procedure and the                         sedation options and risks were discussed with the                         patient. All questions were answered and informed                         consent was obtained.                        - Patient identification and proposed procedure were                         verified prior to the procedure.                        - ASA Grade Assessment: II - A patient with mild                         systemic disease.                        After obtaining informed consent, the endoscope was                         passed under direct vision. Throughout the procedure,                         the patient's blood pressure, pulse, and oxygen                         saturations were monitored continuously. The Endoscope                         was introduced through the mouth, and advanced to the                         second part of duodenum. The upper GI endoscopy was                         accomplished with ease.  The patient tolerated the                         procedure well. Findings:      The esophagus and gastroesophageal junction were examined with white       light. There were esophageal mucosal changes classified as Barrett's       stage C1-M2 per Prague criteria. These changes involved the  mucosa at       the upper extent of the gastric folds (35 cm from the incisors)       extending to the Z-line (33 cm from the incisors). The maximum       longitudinal extent of these esophageal mucosal changes was 2 cm in       length. Mucosa was biopsied with a cold forceps for histology in 4       quadrants at intervals of 1 cm. One specimen bottle was sent to       pathology.      Patchy mild mucosal changes characterized by discoloration, altered       texture and thickened folds were found in the gastric body. Biopsies       were taken with a cold forceps for histology.      The exam of the stomach was otherwise normal.      A single 4 mm angioectasia without bleeding was found in the second       portion of the duodenum. Coagulation for bleeding prevention using argon       plasma was successful.      The exam of the duodenum was otherwise normal. Impression:            - Esophageal mucosal changes classified as Barrett's                         stage C1-M2 per Prague criteria. Biopsied.                        - Discolored, texture changed and thickened folds                         mucosa in the gastric body. Biopsied.                        - A single non-bleeding angioectasia in the duodenum.                         Treated with argon plasma coagulation (APC). Recommendation:        - Await pathology results.                        - Discuss colonoscopy on next clinic visit                        - Return to my office in 2 weeks.                        - Resume Plavix as instructed by primary provider or                         tomorrow                        -  Return to primary care physician as previously                         scheduled. Procedure Code(s):     --- Professional ---                        [phone], 56, Esophagogastroduodenoscopy, flexible,                         transoral; with control of bleeding, any method                        [id number],  Esophagogastroduodenoscopy, flexible,                         transoral; with biopsy, single or multiple Diagnosis Code(s):     --- Professional ---                        K22.70, Barrett's esophagus without dysplasia                        K31.89, Other diseases of stomach and duodenum                        K31.819, Angiodysplasia of stomach and duodenum                         without bleeding                        K92.1, Melena (includes Hematochezia) CPT copyright 2019 American Medical Association. All rights reserved. The codes documented in this report are preliminary and upon coder review may  be revised to meet current compliance requirements.  Vonda Antigua, MD Margretta Sidle B. Bonna Gains MD, MD 03/07/2020 8:53:36 AM This report has been signed electronically. Number of Addenda: 0 Note Initiated On: 03/07/2020 7:35 AM Estimated Blood Loss:  Estimated blood loss: none.      [facility]

## 2020-03-07 NOTE — Anesthesia Preprocedure Evaluation (Addendum)
Anesthesia Evaluation  Patient identified by MRN, date of birth, ID band Patient awake    Reviewed: Allergy & Precautions, H&P , NPO status , Patient's Chart, lab work & pertinent test results  Airway Mallampati: II  TM Distance: >3 FB     Dental  (+) Chipped   Pulmonary former smoker,    breath sounds clear to auscultation       Cardiovascular hypertension,  Rhythm:regular Rate:Normal     Neuro/Psych TIAnegative psych ROS   GI/Hepatic Neg liver ROS, GERD  ,Barrett's esophagus   Endo/Other  negative endocrine ROS  Renal/GU CRFRenal disease  negative genitourinary   Musculoskeletal   Abdominal   Peds  Hematology negative hematology ROS (+)   Anesthesia Other Findings Past Medical History: No date: Barrett's esophagus No date: Blood in stool     Comment:  colonoscopy done ~2010 No date: Cancer ([place])     Comment:  basal cell CA per derm No date: GERD (gastroesophageal reflux disease) No date: Hyperlipidemia No date: Hypertension No date: Kidney stones No date: TIA (transient ischemic attack)     Comment:  2016  Past Surgical History: No date: PROSTATE BIOPSY     Comment:  neg x2 per patient No date: SHOULDER SURGERY     Comment:  per Dr. Theda Sers, R shoulder     Reproductive/Obstetrics negative OB ROS                            Anesthesia Physical Anesthesia Plan  ASA: II  Anesthesia Plan: General   Post-op Pain Management:    Induction:   PONV Risk Score and Plan: Propofol infusion and TIVA  Airway Management Planned: Natural Airway and Nasal Cannula  Additional Equipment:   Intra-op Plan:   Post-operative Plan:   Informed Consent: I have reviewed the patients History and Physical, chart, labs and discussed the procedure including the risks, benefits and alternatives for the proposed anesthesia with the patient or authorized representative who has indicated his/her  understanding and acceptance.     Dental Advisory Given  Plan Discussed with: Anesthesiologist, CRNA and Surgeon  Anesthesia Plan Comments:        Anesthesia Quick Evaluation

## 2020-03-07 NOTE — Anesthesia Postprocedure Evaluation (Signed)
Anesthesia Post Note  Patient: Justin Ford  Procedure(s) Performed: ESOPHAGOGASTRODUODENOSCOPY (EGD) WITH PROPOFOL (N/A )  Patient location during evaluation: PACU Anesthesia Type: General Level of consciousness: awake and alert Pain management: pain level controlled Vital Signs Assessment: post-procedure vital signs reviewed and stable Respiratory status: spontaneous breathing, nonlabored ventilation and respiratory function stable Cardiovascular status: blood pressure returned to baseline and stable Postop Assessment: no apparent nausea or vomiting Anesthetic complications: no     Last Vitals:  Vitals:   03/07/20 0900 03/07/20 0910  BP: (!) 141/93 (!) 147/92  Pulse: 78 67  Resp: 16 18  Temp:    SpO2: 100% 99%    Last Pain:  Vitals:   03/07/20 0850  TempSrc: Tympanic  PainSc:                  Justin Ford

## 2020-03-08 LAB — SURGICAL PATHOLOGY

## 2020-03-11 DIAGNOSIS — D2262 Melanocytic nevi of left upper limb, including shoulder: Secondary | ICD-10-CM | POA: Diagnosis not present

## 2020-03-11 DIAGNOSIS — D2261 Melanocytic nevi of right upper limb, including shoulder: Secondary | ICD-10-CM | POA: Diagnosis not present

## 2020-03-11 DIAGNOSIS — D225 Melanocytic nevi of trunk: Secondary | ICD-10-CM | POA: Diagnosis not present

## 2020-03-11 DIAGNOSIS — Z85828 Personal history of other malignant neoplasm of skin: Secondary | ICD-10-CM | POA: Diagnosis not present

## 2020-03-11 DIAGNOSIS — X32XXXA Exposure to sunlight, initial encounter: Secondary | ICD-10-CM | POA: Diagnosis not present

## 2020-03-11 DIAGNOSIS — D2271 Melanocytic nevi of right lower limb, including hip: Secondary | ICD-10-CM | POA: Diagnosis not present

## 2020-03-11 DIAGNOSIS — L570 Actinic keratosis: Secondary | ICD-10-CM | POA: Diagnosis not present

## 2020-03-15 ENCOUNTER — Encounter: Payer: Self-pay | Admitting: Gastroenterology

## 2020-03-19 ENCOUNTER — Encounter: Payer: Self-pay | Admitting: *Deleted

## 2020-03-19 ENCOUNTER — Ambulatory Visit (INDEPENDENT_AMBULATORY_CARE_PROVIDER_SITE_OTHER): Payer: Medicare Other | Admitting: Gastroenterology

## 2020-03-19 ENCOUNTER — Encounter: Payer: Self-pay | Admitting: Gastroenterology

## 2020-03-19 ENCOUNTER — Other Ambulatory Visit: Payer: Self-pay

## 2020-03-19 ENCOUNTER — Ambulatory Visit: Payer: Medicare Other | Admitting: Gastroenterology

## 2020-03-19 VITALS — BP 139/78 | HR 71 | Temp 98.2°F | Wt 171.4 lb

## 2020-03-19 DIAGNOSIS — K2270 Barrett's esophagus without dysplasia: Secondary | ICD-10-CM

## 2020-03-19 DIAGNOSIS — Q2730 Arteriovenous malformation, site unspecified: Secondary | ICD-10-CM

## 2020-03-19 MED ORDER — PANTOPRAZOLE SODIUM 20 MG PO TBEC: 20.0000 mg | DELAYED_RELEASE_TABLET | Freq: Every day | ORAL | 0 refills | Status: DC

## 2020-03-19 NOTE — Patient Instructions (Addendum)
Gastrointestinal Arteriovenous Malformations  A gastrointestinal arteriovenous malformation is a rare defect of tangled veins and arteries in the intestine. This defect can occur anywhere in the intestine. The defect creates an abnormal collection of blood vessels that can put pressure on other parts of your body that are close by. It causes blood vessels to expand (dilate) over time and sometimes bleed. Many people with this defect never have any symptoms. This defect may be present at birth (congenital). What are the causes? The cause of this condition is not known. It may be related to genetic causes or other factors. What are the signs or symptoms? Signs and symptoms often do not appear until you are older and may include:  Blood in your stool.  Black or dark red stools.  Weakness.  Tiredness.  Shortness of breath.  Iron deficiency anemia. How is this diagnosed? This condition is diagnosed based on a physical exam and a medical history. Imaging tests may also be done, such as:  X-ray.  CT scan.  Angiogram. This is a procedure used to examine the blood vessels. In this procedure, contrast dye is injected through a thin tube (catheter) into an artery. X-rays are then taken, which show if there is a blockage or problem in a blood vessel.  Endoscopy. In this procedure, your health care provider passes a thin, flexible tube (endoscope) through your mouth and down your esophagus into your stomach. A small camera is attached to the end of the tube. Images from the camera appear on a monitor in the exam room.  Small bowel enteroscopy. This is a procedure that uses a long thin scope to visualize areas in the small bowel.  Capsule endoscopy. In this procedure, you will swallow a small capsule that has a tiny camera and transmitter in it. This capsule naturally travels through your digestive system. The camera takes photos of the small intestine along the way. How is this treated? The goal  of treatment is to stop blood loss and prevent any future bleeding. Treatment may include:  Procedures to stop the bleeding or to destroy the affected vessels.  Abdominal surgery to remove the affected section of the digestive system.  Blood transfusion to replace blood loss.  Iron supplements for iron deficiency anemia. Follow these instructions at home:  Take over-the-counter and prescription medicines only as told by your health care provider. This includes iron supplements, if you are told to use them.  Keep all follow-up visits as told by your health care provider. This is important. Contact a health care provider if:  You have streaks of blood in your stool for a long time.  Your stool is black or deep red.  You have signs of anemia such as weakness, fatigue, or shortness of breath.  You have new symptoms. Get help right away if:  You have heavy bleeding during a bowel movement. Summary  Gastrointestinal arteriovenous malformation refers to an abnormal collection of blood vessels that can put pressure on other parts of your body that are close by. It causes blood vessels to dilate over time and sometimes bleed.  Many people with this defect never have any symptoms.  The goal of treatment is to stop blood loss and prevent any future bleeding. This information is not intended to replace advice given to you by your health care provider. Make sure you discuss any questions you have with your health care provider. Document Revised: 09/03/2017 Document Reviewed: 11/18/2016 Elsevier Patient Education  2020 [employer].  [employer]  Esophagus  Barrett's esophagus occurs when the tissue that lines the esophagus changes or becomes damaged. The esophagus is the tube that carries food from the throat to the stomach. With Barrett's esophagus, the cells that line the esophagus are replaced by cells that are similar to the lining of the intestines (intestinal metaplasia). Barrett's  esophagus itself may not cause any symptoms. However, many people who have Barrett's esophagus also have gastroesophageal reflux disease (GERD), which may cause symptoms such as heartburn. Over time, a few people with this condition may develop cancer of the esophagus. Treatment may include medicines, procedures to destroy the abnormal cells, or surgery. What are the causes? The exact cause of this condition is not known. In some cases, the condition develops from damage to the lining of the esophagus caused by GERD. GERD occurs when stomach acids flow up from the stomach into the esophagus. Frequent symptoms of GERD may cause intestinal metaplasia or cause cell changes (dysplasia). What increases the risk? You are more likely to develop this condition if you:  Have GERD.  Are male.  Are Caucasian.  Are obese.  Are older than 50.  Have a hiatal hernia. This is a condition in which part of your stomach bulges into your chest.  Smoke. What are the signs or symptoms? People with Barrett's esophagus often have no symptoms. However, many people with this condition also have GERD. Symptoms of GERD may include:  Heartburn.  Difficulty swallowing.  Dry cough. How is this diagnosed? This condition may be diagnosed based on:  Results of an upper gastrointestinal endoscopy. For this exam, a thin, flexible tube with a light and a camera on the end (endoscope) is passed down your esophagus. Your health care provider can view the inside of your esophagus during this procedure.  Results of a biopsy. For this procedure, several tissue samples are removed (biopsy) from your esophagus. They are then checked for intestinal metaplasia or dysplasia. How is this treated? Treatment for this condition may include:  Medicines (proton pump inhibitors, or PPIs) to decrease or stop GERD.  Periodic endoscopic exams to make sure that cancer is not developing.  A procedure or surgery for dysplasia. This may  include: ? Removal or destruction of abnormal cells. ? Removal of part of the esophagus (esophagectomy). Follow these instructions at home: Eating and drinking  Eat more fruits and vegetables.  Avoid fatty foods.  Eat small, frequent meals instead of large meals.  Avoid foods that cause heartburn. These foods include: ? Coffee and alcoholic drinks. ? Tomatoes and foods made with tomatoes. ? Greasy or spicy foods. ? Chocolate and peppermint.  Do not drink alcohol. General instructions  Take over-the-counter and prescription medicines only as told by your health care provider.  Do not use any products that contain nicotine or tobacco, such as cigarettes and e-cigarettes. If you need help quitting, ask your health care provider.  If you are being treated for GERD, make sure you take medicines and follow all instructions as told by your health care provider.  Keep all follow-up visits as told by your health care provider. This is important. Contact a health care provider if:  You have heartburn or GERD symptoms.  You have difficulty swallowing. Get help right away if:  You have chest pain.  You are unable to swallow.  You vomit blood or material that looks like coffee grounds.  Your stool (feces) is bright red or dark. Summary  Barrett's esophagus occurs when the tissue that lines  the esophagus changes or becomes damaged.  Barrett's esophagus may be diagnosed with an upper gastrointestinal endoscopy and a biopsy.  Treatment may include medicines, procedures to remove abnormal cells, or surgery.  Follow your health care provider's instructions about what to eat and drink, what medicines to take, and when to call for help. This information is not intended to replace advice given to you by your health care provider. Make sure you discuss any questions you have with your health care provider. Document Revised: 01/17/2018 Document Reviewed: 01/17/2018 Elsevier Patient  Education  [employer].

## 2020-03-19 NOTE — Progress Notes (Signed)
Justin Antigua, MD [address]  [place], [place] [address]  Main: [phone]  Fax: [phone]   Primary Care Physician: Justin Ghent, MD   Chief Complaint  Patient presents with  . New Patient (Initial Visit)  . Melena    HPI: Justin Ford is a 86 y.o. male here for follow-up of previous melena, reported on initial visit to our clinic, and has not had any recurrence since initial episode described on his first visit with [place].  EGD showed Barrett's esophagus.  Nonbleeding angiectasia in the duodenum was treated with APC.  Patient denies any dysphagia or heartburn.  Gastric biopsies were benign  The patient denies abdominal or flank pain, anorexia, nausea or vomiting, dysphagia, change in bowel habits or black or bloody stools or weight loss.   Current Outpatient Medications  Medication Sig Dispense Refill  . clopidogrel (PLAVIX) 75 MG tablet Take 1 tablet (75 mg total) by mouth daily. 90 tablet 3  . lamoTRIgine (LAMICTAL) 25 MG tablet Take 1 tablet by mouth at bedtime.    [name] lisinopril (ZESTRIL) 20 MG tablet Take 1 tablet (20 mg total) by mouth daily. 90 tablet 3  . rosuvastatin (CRESTOR) 20 MG tablet Take 1 tablet (20 mg total) by mouth daily. 90 tablet 3   No current facility-administered medications for this visit.    Allergies as of 03/19/2020 - Review Complete 03/19/2020  Allergen Reaction Noted  . Lipitor [atorvastatin] Other (See Comments) 03/15/2012    ROS:  General: Negative for anorexia, weight loss, fever, chills, fatigue, weakness. ENT: Negative for hoarseness, difficulty swallowing , nasal congestion. CV: Negative for chest pain, angina, palpitations, dyspnea on exertion, peripheral edema.  Respiratory: Negative for dyspnea at rest, dyspnea on exertion, cough, sputum, wheezing.  GI: See history of present illness. GU:  Negative for dysuria, hematuria, urinary incontinence, urinary frequency, nocturnal urination.  Endo:  Negative for unusual weight change.    Physical Examination:   BP 139/78   Pulse 71   Temp 98.2 F (36.8 C) (Oral)   Wt 171 lb 6.4 oz (77.7 kg)   BMI 26.06 kg/m   General: Well-nourished, well-developed in no acute distress.  Eyes: No icterus. Conjunctivae pink. Mouth: Oropharyngeal mucosa moist and pink , no lesions erythema or exudate. Neck: Supple, Trachea midline Abdomen: Bowel sounds are normal, nontender, nondistended, no hepatosplenomegaly or masses, no abdominal bruits or hernia , no rebound or guarding.   Extremities: No lower extremity edema. No clubbing or deformities. Neuro: Alert and oriented x 3.  Grossly intact. Skin: Warm and dry, no jaundice.   Psych: Alert and cooperative, normal mood and affect.   Labs: CMP     Component Value Date/Time   NA 140 05/24/2019 0826   K 4.8 05/24/2019 0826   K 4.2 08/02/2014 1605   CL 110 05/24/2019 0826   CO2 25 05/24/2019 0826   GLUCOSE 108 (H) 05/24/2019 0826   BUN 25 (H) 05/24/2019 0826   CREATININE 1.74 (H) 05/24/2019 0826   CREATININE 1.51 (H) 03/06/2016 1601   CALCIUM 9.4 05/24/2019 0826   PROT 7.4 04/19/2019 0847   ALBUMIN 4.2 04/19/2019 0847   AST 14 04/19/2019 0847   ALT 13 04/19/2019 0847   ALKPHOS 83 04/19/2019 0847   BILITOT 0.7 04/19/2019 0847   GFRNONAA 40 (L) 09/24/2015 0517   GFRAA 46 (L) 09/24/2015 0517   Lab Results  Component Value Date   WBC 5.6 05/20/2016   HGB 13.3 02/19/2020  HCT 41.8 05/20/2016   MCV 91.4 05/20/2016   PLT 171.0 05/20/2016    Imaging Studies: No results found.  Assessment and Plan:   Justin Ford is a 86 y.o. y/o male with previous episode of melena currently, with normal hemoglobin, nonbleeding angiectasia in the duodenum that was treated with APC, and Barrett's esophagus  Patient's daughter who helps him make decisions was not present for the appointment today. Everything was discussed with the patient today and he would like to go home discussed that with his  daughter before further steps  We discussed that the angiectasia that was present may have caused his melena, however, it was not bleeding and small, and therefore other lesions like AVMs can be present in other parts of his GI tract.   Options would include colonoscopy to rule out malignancy and other AVMs. CT colonoscopy which would help assess for large lesions but not rule out other AVMs. Vs no further testing since melena has resolved  Pt is leaning toward having colonoscopy but would like to discuss it with his daughter first  In addition, with the diagnosis of barrett's esophagus low dose PPI daily is recommended based on guidelines to prevent worsening  (Risks of PPI use were discussed with patient including bone loss, C. Diff diarrhea, pneumonia, infections, CKD, electrolyte abnormalities.  Pt. Verbalizes understanding and chooses to continue the medication.)     Dr Justin Ford

## 2020-03-29 ENCOUNTER — Telehealth: Payer: Self-pay

## 2020-03-29 ENCOUNTER — Other Ambulatory Visit: Payer: Self-pay

## 2020-03-29 DIAGNOSIS — K921 Melena: Secondary | ICD-10-CM

## 2020-03-29 DIAGNOSIS — Q2730 Arteriovenous malformation, site unspecified: Secondary | ICD-10-CM

## 2020-03-29 MED ORDER — NA SULFATE-K SULFATE-MG SULF 17.5-3.13-1.6 GM/177ML PO SOLN: ORAL | 0 refills | Status: DC

## 2020-03-29 NOTE — Telephone Encounter (Signed)
I called patient's daughter-Daphna back and explained to her that her father had Barrett's esophagus and therefore Dr. Bonna Gains would like to keep a check on him. I also told her that Dr. Bonna Gains prescribed him Pantoprazole to take daily. Daphna stated that she knew that her daughter has had Barrett's esophagus for years.  Dr. Bonna Gains, are you recommending a colonoscopy on this patient? Please let me know. Thank you.

## 2020-03-29 NOTE — Telephone Encounter (Signed)
Patient called stating that he would like the physician to call his daughter-Daphna to let her know why he needs a colonoscopy. I told patient that we needed to schedule a follow up appointment with Dr. Bonna Gains to go over his EGD results and this way she could explain to his daughter why Dr. Bonna Gains is wanting him to get a colonoscopy. Patient did not want to schedule an appointment at this time. However, we will contact daughter to try and schedule an appointment.

## 2020-04-01 NOTE — Telephone Encounter (Signed)
Patient was scheduled for a colonoscopy on 04/18/2020 with Dr. Bonna Gains.

## 2020-04-16 ENCOUNTER — Other Ambulatory Visit: Payer: Self-pay

## 2020-04-16 ENCOUNTER — Other Ambulatory Visit
Admission: RE | Admit: 2020-04-16 | Discharge: 2020-04-16 | Disposition: A | Discharge status: Home / Self Care | Payer: Medicare Other | Source: Ambulatory Visit | Attending: Gastroenterology | Admitting: Gastroenterology

## 2020-04-16 DIAGNOSIS — Z20822 Contact with and (suspected) exposure to covid-19: Secondary | ICD-10-CM | POA: Diagnosis not present

## 2020-04-16 LAB — SARS CORONAVIRUS 2 (TAT 6-24 HRS): SARS Coronavirus 2: NEGATIVE

## 2020-04-18 ENCOUNTER — Encounter
Admission: RE | Disposition: A | Discharge status: Home / Self Care | Payer: Self-pay | Source: Home / Self Care | Attending: Gastroenterology

## 2020-04-18 ENCOUNTER — Ambulatory Visit: Payer: Medicare Other | Admitting: Certified Registered"

## 2020-04-18 ENCOUNTER — Encounter: Payer: Self-pay | Admitting: Gastroenterology

## 2020-04-18 ENCOUNTER — Ambulatory Visit
Admission: RE | Admit: 2020-04-18 | Discharge: 2020-04-18 | Disposition: A | Discharge status: Home / Self Care | Payer: Medicare Other | Attending: Gastroenterology | Admitting: Gastroenterology

## 2020-04-18 DIAGNOSIS — Z8673 Personal history of transient ischemic attack (TIA), and cerebral infarction without residual deficits: Secondary | ICD-10-CM | POA: Diagnosis not present

## 2020-04-18 DIAGNOSIS — Z79899 Other long term (current) drug therapy: Secondary | ICD-10-CM | POA: Insufficient documentation

## 2020-04-18 DIAGNOSIS — K5730 Diverticulosis of large intestine without perforation or abscess without bleeding: Secondary | ICD-10-CM | POA: Insufficient documentation

## 2020-04-18 DIAGNOSIS — Z888 Allergy status to other drugs, medicaments and biological substances status: Secondary | ICD-10-CM | POA: Insufficient documentation

## 2020-04-18 DIAGNOSIS — K5520 Angiodysplasia of colon without hemorrhage: Secondary | ICD-10-CM

## 2020-04-18 DIAGNOSIS — Z87891 Personal history of nicotine dependence: Secondary | ICD-10-CM | POA: Diagnosis not present

## 2020-04-18 DIAGNOSIS — Z87442 Personal history of urinary calculi: Secondary | ICD-10-CM | POA: Insufficient documentation

## 2020-04-18 DIAGNOSIS — K921 Melena: Secondary | ICD-10-CM | POA: Diagnosis not present

## 2020-04-18 DIAGNOSIS — D123 Benign neoplasm of transverse colon: Secondary | ICD-10-CM | POA: Insufficient documentation

## 2020-04-18 DIAGNOSIS — K2270 Barrett's esophagus without dysplasia: Secondary | ICD-10-CM | POA: Diagnosis not present

## 2020-04-18 DIAGNOSIS — E785 Hyperlipidemia, unspecified: Secondary | ICD-10-CM | POA: Diagnosis not present

## 2020-04-18 DIAGNOSIS — Q2733 Arteriovenous malformation of digestive system vessel: Secondary | ICD-10-CM | POA: Diagnosis not present

## 2020-04-18 DIAGNOSIS — D122 Benign neoplasm of ascending colon: Secondary | ICD-10-CM | POA: Diagnosis not present

## 2020-04-18 DIAGNOSIS — Z8249 Family history of ischemic heart disease and other diseases of the circulatory system: Secondary | ICD-10-CM | POA: Diagnosis not present

## 2020-04-18 DIAGNOSIS — I10 Essential (primary) hypertension: Secondary | ICD-10-CM | POA: Diagnosis not present

## 2020-04-18 DIAGNOSIS — Z809 Family history of malignant neoplasm, unspecified: Secondary | ICD-10-CM | POA: Diagnosis not present

## 2020-04-18 DIAGNOSIS — K635 Polyp of colon: Secondary | ICD-10-CM | POA: Diagnosis not present

## 2020-04-18 DIAGNOSIS — Z811 Family history of alcohol abuse and dependence: Secondary | ICD-10-CM | POA: Diagnosis not present

## 2020-04-18 DIAGNOSIS — Z820 Family history of epilepsy and other diseases of the nervous system: Secondary | ICD-10-CM | POA: Diagnosis not present

## 2020-04-18 DIAGNOSIS — Q2730 Arteriovenous malformation, site unspecified: Secondary | ICD-10-CM

## 2020-04-18 DIAGNOSIS — Z85828 Personal history of other malignant neoplasm of skin: Secondary | ICD-10-CM | POA: Diagnosis not present

## 2020-04-18 DIAGNOSIS — K219 Gastro-esophageal reflux disease without esophagitis: Secondary | ICD-10-CM | POA: Diagnosis not present

## 2020-04-18 DIAGNOSIS — K5790 Diverticulosis of intestine, part unspecified, without perforation or abscess without bleeding: Secondary | ICD-10-CM | POA: Diagnosis not present

## 2020-04-18 HISTORY — PX: COLONOSCOPY WITH PROPOFOL: SHX5780

## 2020-04-18 SURGERY — COLONOSCOPY WITH PROPOFOL
Anesthesia: General

## 2020-04-18 MED ORDER — SODIUM CHLORIDE 0.9 % IV SOLN: INTRAVENOUS | Status: DC

## 2020-04-18 MED ORDER — PROPOFOL 10 MG/ML IV BOLUS
INTRAVENOUS | Status: AC
  Filled 2020-04-18: qty 40

## 2020-04-18 MED ORDER — PROPOFOL 10 MG/ML IV BOLUS
INTRAVENOUS | Status: AC
  Filled 2020-04-18: qty 20

## 2020-04-18 MED ORDER — PROPOFOL 10 MG/ML IV BOLUS
INTRAVENOUS | Status: DC | PRN
  Administered 2020-04-18: 50 mg via INTRAVENOUS

## 2020-04-18 MED ORDER — PROPOFOL 500 MG/50ML IV EMUL
INTRAVENOUS | Status: DC | PRN
  Administered 2020-04-18: 150 ug/kg/min via INTRAVENOUS

## 2020-04-18 NOTE — Transfer of Care (Signed)
Immediate Anesthesia Transfer of Care Note  Patient: Justin Ford  Procedure(s) Performed: COLONOSCOPY WITH PROPOFOL (N/A )  Patient Location: Endoscopy Unit  Anesthesia Type:General  Level of Consciousness: awake, alert  and oriented  Airway & Oxygen Therapy: Patient Spontanous Breathing and Patient connected to nasal cannula oxygen  Post-op Assessment: Report given to RN and Post -op Vital signs reviewed and stable  Post vital signs: Reviewed and stable  Last Vitals:  Vitals Value Taken Time  BP    Temp    Pulse    Resp    SpO2      Last Pain:  Vitals:   04/18/20 0942  PainSc: 0-No pain         Complications: No complications documented.

## 2020-04-18 NOTE — Op Note (Signed)
[facility] Patient Name: Justin Ford Procedure Date: 04/18/2020 9:39 AM MRN: [id number] Account #: [id number] Date of Birth: 1930-05-18 Admit Type: Outpatient Age: 86 Room: [facility] ENDO ROOM 2 Gender: Male Note Status: Finalized Procedure:             Colonoscopy Indications:           Melena Providers:             Reyanna Baley B. Bonna Gains MD, MD Medicines:             Monitored Anesthesia Care Complications:         No immediate complications. Procedure:             Pre-Anesthesia Assessment:                        - ASA Grade Assessment: II - A patient with mild                         systemic disease.                        - Prior to the procedure, a History and Physical was                         performed, and patient medications, allergies and                         sensitivities were reviewed. The patient's tolerance                         of previous anesthesia was reviewed.                        - The risks and benefits of the procedure and the                         sedation options and risks were discussed with the                         patient. All questions were answered and informed                         consent was obtained.                        - Patient identification and proposed procedure were                         verified prior to the procedure by the physician, the                         nurse, the anesthesiologist, the anesthetist and the                         technician. The procedure was verified in the                         procedure room.  After obtaining informed consent, the colonoscope was                         passed under direct vision. Throughout the procedure,                         the patient's blood pressure, pulse, and oxygen                         saturations were monitored continuously. The                         Colonoscope was introduced through the anus and                          advanced to the the cecum, identified by appendiceal                         orifice and ileocecal valve. The colonoscopy was                         performed with ease. The patient tolerated the                         procedure well. The quality of the bowel preparation                         was good. Findings:      The perianal and digital rectal examinations were normal.      A few angioectasias with bleeding on contact were found in the ascending       colon and in the cecum. Coagulation for hemostasis using argon plasma       was successful.      A 4 mm polyp was found in the ascending colon. The polyp was sessile.       The polyp was removed with a cold biopsy forceps. Resection and       retrieval were complete.      A 12 mm polyp was found in the transverse colon. The polyp was       multi-lobulated. The polyp was removed with a piecemeal technique using       a cold snare. Resection and retrieval were complete. To prevent bleeding       after the polypectomy, one hemostatic clip was successfully placed.       There was no bleeding at the end of the procedure.      Multiple diverticula were found in the sigmoid colon.      The exam was otherwise without abnormality.      The rectum, sigmoid colon, descending colon, transverse colon, ascending       colon and cecum appeared normal.      The retroflexed view of the distal rectum and anal verge was normal and       showed no anal or rectal abnormalities. Impression:            - A few colonic angioectasias. Treated with argon                         plasma coagulation (APC).                        -  One 4 mm polyp in the ascending colon, removed with                         a cold biopsy forceps. Resected and retrieved.                        - One 12 mm polyp in the transverse colon, removed                         piecemeal using a cold snare. Resected and retrieved.                         Clip was  placed.                        - Diverticulosis in the sigmoid colon.                        - The examination was otherwise normal.                        - The rectum, sigmoid colon, descending colon,                         transverse colon, ascending colon and cecum are normal.                        - The distal rectum and anal verge are normal on                         retroflexion view. Recommendation:        - Discharge patient to home (with escort).                        - Advance diet as tolerated.                        - Continue present medications.                        - Await pathology results.                        - Repeat colonoscopy date to be determined after                         pending pathology results are reviewed.                        - The findings and recommendations were discussed with                         the patient.                        - The findings and recommendations were discussed with                         the patient's family.                        -  Return to primary care physician as previously                         scheduled.                        - High fiber diet. Procedure Code(s):     --- Professional ---                        [id number], 59, Colonoscopy, flexible; with control of                         bleeding, any method                        45385, Colonoscopy, flexible; with removal of                         tumor(s), polyp(s), or other lesion(s) by snare                         technique                        45380, 51, Colonoscopy, flexible; with biopsy, single                         or multiple Diagnosis Code(s):     --- Professional ---                        K55.20, Angiodysplasia of colon without hemorrhage                        K63.5, Polyp of colon                        K92.1, Melena (includes Hematochezia) CPT copyright 2019 American Medical Association. All rights reserved. The codes documented in  this report are preliminary and upon coder review may  be revised to meet current compliance requirements.  Vonda Antigua, MD Margretta Sidle B. Bonna Gains MD, MD 04/18/2020 10:53:34 AM This report has been signed electronically. Number of Addenda: 0 Note Initiated On: 04/18/2020 9:39 AM Scope Withdrawal Time: 0 hours 25 minutes 28 seconds  Total Procedure Duration: 0 hours 38 minutes 43 seconds  Estimated Blood Loss:  Estimated blood loss: none.      [facility] [address]

## 2020-04-18 NOTE — Anesthesia Postprocedure Evaluation (Signed)
Anesthesia Post Note  Patient: LEAMAN ABE  Procedure(s) Performed: COLONOSCOPY WITH PROPOFOL (N/A )  Patient location during evaluation: Endoscopy Anesthesia Type: General Level of consciousness: awake and alert Pain management: pain level controlled Vital Signs Assessment: post-procedure vital signs reviewed and stable Respiratory status: spontaneous breathing, nonlabored ventilation, respiratory function stable and patient connected to nasal cannula oxygen Cardiovascular status: blood pressure returned to baseline and stable Postop Assessment: no apparent nausea or vomiting Anesthetic complications: no   No complications documented.   Last Vitals:  Vitals:   04/18/20 0942 04/18/20 1049  BP: (!) 139/98   Pulse: 83   Resp: 16   Temp: (!) 36.3 C (!) 36.1 C  SpO2: 100%     Last Pain:  Vitals:   04/18/20 1049  TempSrc: Temporal  PainSc:                  Martha Clan

## 2020-04-18 NOTE — Anesthesia Preprocedure Evaluation (Signed)
Anesthesia Evaluation  Patient identified by MRN, date of birth, ID band Patient awake    Reviewed: Allergy & Precautions, H&P , NPO status , Patient's Chart, lab work & pertinent test results  History of Anesthesia Complications Negative for: history of anesthetic complications  Airway Mallampati: II  TM Distance: >3 FB     Dental  (+) Chipped, Dental Advidsory Given   Pulmonary neg pulmonary ROS, former smoker,    breath sounds clear to auscultation       Cardiovascular Exercise Tolerance: Good hypertension,  Rhythm:regular Rate:Normal     Neuro/Psych TIAnegative psych ROS   GI/Hepatic Neg liver ROS, GERD  ,Barrett's esophagus   Endo/Other  negative endocrine ROS  Renal/GU CRFRenal disease  negative genitourinary   Musculoskeletal   Abdominal   Peds  Hematology negative hematology ROS (+)   Anesthesia Other Findings Past Medical History: No date: Barrett's esophagus No date: Blood in stool     Comment:  colonoscopy done ~2010 No date: Cancer ([place])     Comment:  basal cell CA per derm No date: GERD (gastroesophageal reflux disease) No date: Hyperlipidemia No date: Hypertension No date: Kidney stones No date: TIA (transient ischemic attack)     Comment:  2016  Past Surgical History: No date: PROSTATE BIOPSY     Comment:  neg x2 per patient No date: SHOULDER SURGERY     Comment:  per Dr. Theda Sers, R shoulder     Reproductive/Obstetrics negative OB ROS                             Anesthesia Physical  Anesthesia Plan  ASA: II  Anesthesia Plan: General   Post-op Pain Management:    Induction: Intravenous  PONV Risk Score and Plan: Propofol infusion and TIVA  Airway Management Planned: Natural Airway and Nasal Cannula  Additional Equipment:   Intra-op Plan:   Post-operative Plan:   Informed Consent: I have reviewed the patients History and Physical, chart, labs  and discussed the procedure including the risks, benefits and alternatives for the proposed anesthesia with the patient or authorized representative who has indicated his/her understanding and acceptance.     Dental Advisory Given  Plan Discussed with: Anesthesiologist, CRNA and Surgeon  Anesthesia Plan Comments:         Anesthesia Quick Evaluation

## 2020-04-18 NOTE — H&P (Signed)
Vonda Antigua, MD [address], [place], [place], [place], [address] [id number], [place], [place], [place], [address] Phone: [phone]  Fax: [phone]  Primary Care Physician:  [name], MD   Pre-Procedure History & Physical: HPI:  ALECXIS BALTZELL is a 86 y.o. male is here for a colonoscopy.   Past Medical History:  Diagnosis Date  . Barrett's esophagus   . Blood in stool    colonoscopy done ~2010  . Cancer ([facility])    basal cell CA per derm  . GERD (gastroesophageal reflux disease)   . Hyperlipidemia   . Hypertension   . Kidney stones   . TIA (transient ischemic attack)    2016    Past Surgical History:  Procedure Laterality Date  . ESOPHAGOGASTRODUODENOSCOPY (EGD) WITH PROPOFOL N/A 03/07/2020   Procedure: ESOPHAGOGASTRODUODENOSCOPY (EGD) WITH PROPOFOL;  Surgeon: Virgel Manifold, MD;  Location: [facility] ENDOSCOPY;  Service: Endoscopy;  Laterality: N/A;  . PROSTATE BIOPSY     neg x2 per patient  . SHOULDER SURGERY     per Dr. Theda Sers, R shoulder    Prior to Admission medications   Medication Sig Start Date End Date Taking? Authorizing Provider  lamoTRIgine (LAMICTAL) 25 MG tablet Take 1 tablet by mouth at bedtime. 08/18/18  Yes [provider]  lisinopril (ZESTRIL) 20 MG tablet Take 1 tablet (20 mg total) by mouth daily. 04/25/19  Yes [name], MD  pantoprazole (PROTONIX) 20 MG tablet Take 1 tablet (20 mg total) by mouth daily. 03/19/20 06/17/20 Yes Vonda Antigua B, MD  rosuvastatin (CRESTOR) 20 MG tablet Take 1 tablet (20 mg total) by mouth daily. 04/25/19  Yes [name], MD  clopidogrel (PLAVIX) 75 MG tablet Take 1 tablet (75 mg total) by mouth daily. 04/25/19   [name], MD  Na Sulfate-K Sulfate-Mg Sulf 17.5-3.13-1.6 GM/177ML SOLN At 5 PM the day before procedure take 1 bottle and 5 hours before procedure take 1 bottle. 03/29/20   Virgel Manifold, MD    Allergies as of 03/29/2020 - Review Complete 03/19/2020    Allergen Reaction Noted  . Lipitor [atorvastatin] Other (See Comments) 03/15/2012    Family History  Problem Relation Age of Onset  . Dementia Mother   . Alcohol abuse Father   . Heart disease Brother   . Cancer Sister        possible breast cancer, pt was unsure  . Colon cancer Neg Hx   . Prostate cancer Neg Hx     Social History   Socioeconomic History  . Marital status: Married    Spouse name: Not on file  . Number of children: Not on file  . Years of education: Not on file  . Highest education level: Not on file  Occupational History  . Occupation: retired    Comment: Optometrist  Tobacco Use  . Smoking status: Former Smoker    Packs/day: 0.25    Years: 4.00    Pack years: 1.00    Quit date: 10/05/1950    Years since quitting: 69.5  . Smokeless tobacco: Never Used  Substance and Sexual Activity  . Alcohol use: Not Currently    Alcohol/week: 2.0 standard drinks    Types: 2 Cans of beer per week  . Drug use: No  . Sexual activity: Never  Other Topics Concern  . Not on file  Social History Narrative   Married 1951   From [place].    Retired  accountant   2 kids, 2 grandkids (grandkids are local)   Social Determinants of Health   Financial Resource Strain:   . Difficulty of Paying Living Expenses:   Food Insecurity:   . Worried About Charity fundraiser in the Last Year:   . Arboriculturist in the Last Year:   Transportation Needs:   . Film/video editor (Medical):   [name] Lack of Transportation (Non-Medical):   Physical Activity:   . Days of Exercise per Week:   . Minutes of Exercise per Session:   Stress:   . Feeling of Stress :   Social Connections:   . Frequency of Communication with Friends and Family:   . Frequency of Social Gatherings with Friends and Family:   . Attends Religious Services:   . Active Member of Clubs or Organizations:   . Attends Archivist Meetings:   [name] Marital Status:   Intimate  Partner Violence:   . Fear of Current or Ex-Partner:   . Emotionally Abused:   [name] Physically Abused:   . Sexually Abused:     Review of Systems: See HPI, otherwise negative ROS  Physical Exam: There were no vitals taken for this visit. General:   Alert,  pleasant and cooperative in NAD Head:  Normocephalic and atraumatic. Neck:  Supple; no masses or thyromegaly. Lungs:  Clear throughout to auscultation, normal respiratory effort.    Heart:  +S1, +S2, Regular rate and rhythm, No edema. Abdomen:  Soft, nontender and nondistended. Normal bowel sounds, without guarding, and without rebound.   Neurologic:  Alert and  oriented x4;  grossly normal neurologically.  Impression/Plan: BRYLER DIBBLE is here for a colonoscopy to be performed for melena.  Risks, benefits, limitations, and alternatives regarding  colonoscopy have been reviewed with the patient.  Questions have been answered.  All parties agreeable.   Virgel Manifold, MD  04/18/2020, 9:36 AM

## 2020-04-19 ENCOUNTER — Encounter: Payer: Self-pay | Admitting: Gastroenterology

## 2020-04-19 LAB — SURGICAL PATHOLOGY

## 2020-04-22 ENCOUNTER — Encounter: Payer: Self-pay | Admitting: Gastroenterology

## 2020-05-07 ENCOUNTER — Telehealth: Payer: Self-pay | Admitting: Family Medicine

## 2020-05-07 NOTE — Telephone Encounter (Signed)
Appointment scheduled.

## 2020-05-07 NOTE — Telephone Encounter (Signed)
Pt called stating he is not walking correctly.  He stated he had dropped foot in passed.  Stride not right  Hesitant leary when stepping off side walk.   This has been going on for 2 months or more.  He stated he has had PT @ [facility]  Not dizzy. Chest pain shortness of breath

## 2020-05-07 NOTE — Telephone Encounter (Signed)
If no acutely new sx, then please set up OV.  If new acute neuro sx, then to ER.  He may end up needing PT but I would like to see patient first.  Thanks.

## 2020-05-08 NOTE — Telephone Encounter (Signed)
Patient called in stating he would like to double check with PCP that he feels he should see him prior to seeing Dr.Shaw, who assisted with dropped foot in past. Please advise.

## 2020-05-10 NOTE — Telephone Encounter (Signed)
Patient contacted and will come to see Dr. Damita Dunnings on Monday.

## 2020-05-13 ENCOUNTER — Other Ambulatory Visit: Payer: Self-pay

## 2020-05-13 ENCOUNTER — Encounter: Payer: Self-pay | Admitting: Family Medicine

## 2020-05-13 ENCOUNTER — Ambulatory Visit (INDEPENDENT_AMBULATORY_CARE_PROVIDER_SITE_OTHER): Payer: Medicare Other | Admitting: Family Medicine

## 2020-05-13 VITALS — BP 122/60 | HR 78 | Temp 96.9°F | Ht 68.0 in | Wt 172.0 lb

## 2020-05-13 DIAGNOSIS — E538 Deficiency of other specified B group vitamins: Secondary | ICD-10-CM | POA: Diagnosis not present

## 2020-05-13 DIAGNOSIS — R269 Unspecified abnormalities of gait and mobility: Secondary | ICD-10-CM

## 2020-05-13 LAB — COMPREHENSIVE METABOLIC PANEL
ALT: 19 U/L (ref 0–53)
AST: 23 U/L (ref 0–37)
Albumin: 4 g/dL (ref 3.5–5.2)
Alkaline Phosphatase: 81 U/L (ref 39–117)
BUN: 31 mg/dL — ABNORMAL HIGH (ref 6–23)
CO2: 22 mEq/L (ref 19–32)
Calcium: 9.1 mg/dL (ref 8.4–10.5)
Chloride: 111 mEq/L (ref 96–112)
Creatinine, Ser: 2.65 mg/dL — ABNORMAL HIGH (ref 0.40–1.50)
GFR: 22.91 mL/min — ABNORMAL LOW (ref >60.00)
Glucose, Bld: 89 mg/dL (ref 70–99)
Potassium: 5.5 mEq/L — ABNORMAL HIGH (ref 3.5–5.1)
Sodium: 141 mEq/L (ref 135–145)
Total Bilirubin: 0.5 mg/dL (ref 0.2–1.2)
Total Protein: 7.6 g/dL (ref 6.0–8.3)

## 2020-05-13 LAB — CBC WITH DIFFERENTIAL/PLATELET
Basophils Absolute: 0 10*3/uL (ref 0.0–0.1)
Basophils Relative: 1.2 % (ref 0.0–3.0)
Eosinophils Absolute: 0.2 10*3/uL (ref 0.0–0.7)
Eosinophils Relative: 5.2 % — ABNORMAL HIGH (ref 0.0–5.0)
HCT: 36.5 % — ABNORMAL LOW (ref 39.0–52.0)
Hemoglobin: 12.4 g/dL — ABNORMAL LOW (ref 13.0–17.0)
Lymphocytes Relative: 29.5 % (ref 12.0–46.0)
Lymphs Abs: 1.1 10*3/uL (ref 0.7–4.0)
MCHC: 33.9 g/dL (ref 30.0–36.0)
MCV: 95.4 fl (ref 78.0–100.0)
Monocytes Absolute: 0.2 10*3/uL (ref 0.1–1.0)
Monocytes Relative: 5.8 % (ref 3.0–12.0)
Neutro Abs: 2.2 10*3/uL (ref 1.4–7.7)
Neutrophils Relative %: 58.3 % (ref 43.0–77.0)
Platelets: 148 10*3/uL — ABNORMAL LOW (ref 150.0–400.0)
RBC: 3.82 Mil/uL — ABNORMAL LOW (ref 4.22–5.81)
RDW: 14.2 % (ref 11.5–15.5)
WBC: 3.8 10*3/uL — ABNORMAL LOW (ref 4.0–10.5)

## 2020-05-13 LAB — VITAMIN B12: Vitamin B-12: 174 pg/mL — ABNORMAL LOW (ref 211–911)

## 2020-05-13 MED ORDER — CYANOCOBALAMIN 1000 MCG/ML IJ SOLN
1000.0000 ug | Freq: Once | INTRAMUSCULAR | Status: AC
  Administered 2020-05-13: 1000 ug via INTRAMUSCULAR

## 2020-05-13 NOTE — Progress Notes (Signed)
This visit occurred during the SARS-CoV-2 public health emergency.  Safety protocols were in place, including screening questions prior to the visit, additional usage of staff PPE, and extensive cleaning of exam room while observing appropriate contact time as indicated for disinfecting solutions.  Gait changes noted in the last few months.  When he wants to step off the curb, he notes a hesitation.  TIA 2016.  Family noted gait changes.  No falls.  No near falls.  Seems to affect B legs equally.  Gait is slightly shorter.  He can feels the floor well.  No foot drop now.  Prev foot drop resolved.  He doesn't have unilateral weakness.    No recent B12 injection, due.  D/w pt.    Meds, vitals, and allergies reviewed.   ROS: Per HPI unless specifically indicated in ROS section   GEN: nad, alert and oriented HEENT: ncat NECK: supple w/o LA CV: rrr PULM: ctab, no inc wob ABD: soft, +bs EXT: no edema SKIN: Well-perfused Short but symmetric gait w/o foot drop.  Strength and sensation grossly normal in the extremities without tremor.

## 2020-05-13 NOTE — Patient Instructions (Addendum)
Your stride is a little shorter then previous but not asymmetric.   Go to the lab on the way out.   If you have mychart we'll likely use that to update you.    Take care.  Glad to see you.

## 2020-05-15 ENCOUNTER — Other Ambulatory Visit: Payer: Self-pay | Admitting: Family Medicine

## 2020-05-15 DIAGNOSIS — I10 Essential (primary) hypertension: Secondary | ICD-10-CM

## 2020-05-15 DIAGNOSIS — R269 Unspecified abnormalities of gait and mobility: Secondary | ICD-10-CM | POA: Insufficient documentation

## 2020-05-15 MED ORDER — LISINOPRIL 20 MG PO TABS: 10.0000 mg | ORAL_TABLET | Freq: Every day | ORAL | Status: DC

## 2020-05-15 NOTE — Assessment & Plan Note (Signed)
Unclear if this is contributing to current symptoms.  See notes on labs.

## 2020-05-15 NOTE — Assessment & Plan Note (Signed)
Unclear source.  No asymmetry from one side to the other now.  He does have slightly shorter stride length but no foot drop.  Reasonable to check routine labs and B12 level today.  See notes on labs.

## 2020-05-17 ENCOUNTER — Other Ambulatory Visit: Payer: Self-pay | Admitting: Family Medicine

## 2020-05-17 NOTE — Telephone Encounter (Addendum)
Electronic refill request. Pantoprazole Last office visit:   05/13/2020 Last Filled:    90 tablet 0 03/19/2020 06/17/2020  The original prescription was discontinued on 02/19/2020 by Wayna Chalet, CMA for the following reason: Completed Course. Renewing this prescription may not be appropriate.

## 2020-05-20 NOTE — Telephone Encounter (Signed)
Would continue based on previous GI note from 03/19/2020, with history of Barrett's esophagus.  Rx sent.

## 2020-05-23 ENCOUNTER — Other Ambulatory Visit: Payer: Self-pay | Admitting: Family Medicine

## 2020-05-23 ENCOUNTER — Ambulatory Visit (INDEPENDENT_AMBULATORY_CARE_PROVIDER_SITE_OTHER): Payer: Medicare Other

## 2020-05-23 ENCOUNTER — Other Ambulatory Visit (INDEPENDENT_AMBULATORY_CARE_PROVIDER_SITE_OTHER): Payer: Medicare Other

## 2020-05-23 ENCOUNTER — Other Ambulatory Visit: Payer: Self-pay

## 2020-05-23 DIAGNOSIS — E538 Deficiency of other specified B group vitamins: Secondary | ICD-10-CM | POA: Diagnosis not present

## 2020-05-23 DIAGNOSIS — I10 Essential (primary) hypertension: Secondary | ICD-10-CM | POA: Diagnosis not present

## 2020-05-23 LAB — BASIC METABOLIC PANEL
BUN: 21 mg/dL (ref 6–23)
CO2: 25 mEq/L (ref 19–32)
Calcium: 9.3 mg/dL (ref 8.4–10.5)
Chloride: 108 mEq/L (ref 96–112)
Creatinine, Ser: 2 mg/dL — ABNORMAL HIGH (ref 0.40–1.50)
GFR: 31.7 mL/min — ABNORMAL LOW (ref >60.00)
Glucose, Bld: 94 mg/dL (ref 70–99)
Potassium: 4.2 mEq/L (ref 3.5–5.1)
Sodium: 139 mEq/L (ref 135–145)

## 2020-05-23 MED ORDER — CYANOCOBALAMIN 1000 MCG/ML IJ SOLN
INTRAMUSCULAR | Status: DC
Start: 2020-05-23 — End: 2020-08-07

## 2020-05-23 MED ORDER — CYANOCOBALAMIN 1000 MCG/ML IJ SOLN
1000.0000 ug | Freq: Once | INTRAMUSCULAR | Status: AC
  Administered 2020-05-23: 1000 ug via INTRAMUSCULAR

## 2020-05-23 NOTE — Progress Notes (Signed)
Per orders of Dr. Duncan, injection of vit B12 given by Janavia Rottman. Patient tolerated injection well.  

## 2020-05-26 ENCOUNTER — Encounter: Payer: Self-pay | Admitting: Family Medicine

## 2020-05-26 ENCOUNTER — Other Ambulatory Visit: Payer: Self-pay | Admitting: Family Medicine

## 2020-05-26 DIAGNOSIS — I10 Essential (primary) hypertension: Secondary | ICD-10-CM

## 2020-05-30 ENCOUNTER — Other Ambulatory Visit: Payer: Self-pay | Admitting: Family Medicine

## 2020-05-30 ENCOUNTER — Ambulatory Visit (INDEPENDENT_AMBULATORY_CARE_PROVIDER_SITE_OTHER): Payer: Medicare Other | Admitting: *Deleted

## 2020-05-30 ENCOUNTER — Other Ambulatory Visit: Payer: Self-pay

## 2020-05-30 DIAGNOSIS — E538 Deficiency of other specified B group vitamins: Secondary | ICD-10-CM

## 2020-05-30 MED ORDER — CYANOCOBALAMIN 1000 MCG/ML IJ SOLN
1000.0000 ug | Freq: Once | INTRAMUSCULAR | Status: AC
  Administered 2020-05-30: 1000 ug via INTRAMUSCULAR

## 2020-05-30 NOTE — Progress Notes (Signed)
Per orders of Dr. Damita Dunnings, injection of B12 1000 mcg/mL  given by Jamella Grayer. Patient tolerated injection well.

## 2020-05-31 ENCOUNTER — Telehealth: Payer: Self-pay | Admitting: Radiology

## 2020-05-31 NOTE — Telephone Encounter (Signed)
LVM for daughter to call and schedule a lab appt within the next week. Non fasting

## 2020-06-03 ENCOUNTER — Other Ambulatory Visit (INDEPENDENT_AMBULATORY_CARE_PROVIDER_SITE_OTHER): Payer: Medicare Other

## 2020-06-03 ENCOUNTER — Other Ambulatory Visit: Payer: Self-pay

## 2020-06-03 ENCOUNTER — Telehealth: Payer: Self-pay | Admitting: Family Medicine

## 2020-06-03 DIAGNOSIS — I10 Essential (primary) hypertension: Secondary | ICD-10-CM

## 2020-06-03 LAB — BASIC METABOLIC PANEL
BUN: 21 mg/dL (ref 6–23)
CO2: 24 mEq/L (ref 19–32)
Calcium: 9.4 mg/dL (ref 8.4–10.5)
Chloride: 109 mEq/L (ref 96–112)
Creatinine, Ser: 1.97 mg/dL — ABNORMAL HIGH (ref 0.40–1.50)
GFR: 32.26 mL/min — ABNORMAL LOW (ref >60.00)
Glucose, Bld: 104 mg/dL — ABNORMAL HIGH (ref 70–99)
Potassium: 4.6 mEq/L (ref 3.5–5.1)
Sodium: 141 mEq/L (ref 135–145)

## 2020-06-03 NOTE — Telephone Encounter (Signed)
Patient came in office and stated he was having trouble with his left hip and wanted to gert an xray of it once he comes in Thursday for B-12 shot appointment. Does patient need appointment first or can he get xray done?

## 2020-06-03 NOTE — Telephone Encounter (Signed)
Appointment scheduled.

## 2020-06-03 NOTE — Telephone Encounter (Signed)
I'd like to see him first for OV prior to the xray.  Thanks.

## 2020-06-03 NOTE — Telephone Encounter (Signed)
Left message on patient's voicemail to return call

## 2020-06-06 ENCOUNTER — Ambulatory Visit: Payer: Medicare Other

## 2020-06-06 ENCOUNTER — Ambulatory Visit (INDEPENDENT_AMBULATORY_CARE_PROVIDER_SITE_OTHER): Payer: Medicare Other | Admitting: Family Medicine

## 2020-06-06 ENCOUNTER — Telehealth: Payer: Self-pay | Admitting: Family Medicine

## 2020-06-06 ENCOUNTER — Ambulatory Visit (INDEPENDENT_AMBULATORY_CARE_PROVIDER_SITE_OTHER)
Admission: RE | Admit: 2020-06-06 | Discharge: 2020-06-06 | Disposition: A | Discharge status: Home / Self Care | Payer: Medicare Other | Source: Ambulatory Visit | Attending: Family Medicine | Admitting: Family Medicine

## 2020-06-06 ENCOUNTER — Other Ambulatory Visit: Payer: Self-pay

## 2020-06-06 ENCOUNTER — Encounter: Payer: Self-pay | Admitting: Family Medicine

## 2020-06-06 VITALS — BP 122/78 | HR 74 | Temp 96.5°F | Ht 68.0 in | Wt 170.4 lb

## 2020-06-06 DIAGNOSIS — M25519 Pain in unspecified shoulder: Secondary | ICD-10-CM | POA: Diagnosis not present

## 2020-06-06 DIAGNOSIS — M7062 Trochanteric bursitis, left hip: Secondary | ICD-10-CM

## 2020-06-06 DIAGNOSIS — M19012 Primary osteoarthritis, left shoulder: Secondary | ICD-10-CM | POA: Diagnosis not present

## 2020-06-06 DIAGNOSIS — E538 Deficiency of other specified B group vitamins: Secondary | ICD-10-CM

## 2020-06-06 DIAGNOSIS — K219 Gastro-esophageal reflux disease without esophagitis: Secondary | ICD-10-CM | POA: Diagnosis not present

## 2020-06-06 DIAGNOSIS — I10 Essential (primary) hypertension: Secondary | ICD-10-CM

## 2020-06-06 MED ORDER — CYANOCOBALAMIN 1000 MCG/ML IJ SOLN
1000.0000 ug | Freq: Once | INTRAMUSCULAR | Status: AC
  Administered 2020-06-06: 1000 ug via INTRAMUSCULAR

## 2020-06-06 NOTE — Telephone Encounter (Signed)
Pt called to give meds list  rosuvastain 20mg  Pantoprazole 40mg  Clopidogrel 75mg   If you have any questions please call pt daughter Randal Buba

## 2020-06-06 NOTE — Patient Instructions (Addendum)
Go to the lab on the way out to xray your shoulder.   If you have mychart we'll likely use that to update you.     Check your protonix dose at home. Stop lisinopril for now.    Recheck labs with B12 shot next week.   Please get on the lab and nurse schedule for the same time.    Use ice on your left hip for 5 minutes at a time.   Avoid ibuprofen.  Use tylenol if needed.  We'll call about seeing PT.  Take care.  Glad to see you.

## 2020-06-06 NOTE — Progress Notes (Signed)
This visit occurred during the SARS-CoV-2 public health emergency.  Safety protocols were in place, including screening questions prior to the visit, additional usage of staff PPE, and extensive cleaning of exam room while observing appropriate contact time as indicated for disinfecting solutions.  Hip pain.  Not taking nsaids.  L lateral hip pain.  More pain laying on L side.  Near L greater trochanter.  No trauma.  Now with L shoulder pain.  Pain with overhead movement.  Pain with int and ext rotation.  No trauma.  Normal grip.    Elevated Cr.  D/w pt about holding ACE inhibitor.  No nsaids use.  See follow-up orders.  Most recent labs discussed with patient, especially regarding creatinine trend.  D/w pt about PPI dose, he thought he was on 20mg  protonix. He'll check med list at home.  See follow-up notes.  B12 shot now weekly, with plan to change to monthly after next dose, d/w pt.    Meds, vitals, and allergies reviewed.   ROS: Per HPI unless specifically indicated in ROS section   nad ncat Neck supple, no LA L shoulder with pain int and ext rotation.  AC testing not painful.  Normal gip.  Pain with overhead movement.   L hip with normal ROM, no groin pain with int rotation but L greater troch area ttp. Able to bear weight.   rrr ctab

## 2020-06-07 MED ORDER — PANTOPRAZOLE SODIUM 40 MG PO TBEC: 40.0000 mg | DELAYED_RELEASE_TABLET | Freq: Every day | ORAL | Status: DC

## 2020-06-07 NOTE — Telephone Encounter (Signed)
Patient advised.

## 2020-06-07 NOTE — Telephone Encounter (Signed)
Noted.  Have him continue as is.  Med list updated.  Thanks.

## 2020-06-10 NOTE — Assessment & Plan Note (Signed)
Likely the issue.  Would avoid NSAIDs.  Use ice as needed and update me as needed.  He agrees.

## 2020-06-10 NOTE — Assessment & Plan Note (Signed)
Hold lisinopril for now.  Recheck creatinine with nurse/lab visit next week.  He agrees.

## 2020-06-10 NOTE — Assessment & Plan Note (Signed)
B12 shot done today with plan to change to monthly dosing after next dose.

## 2020-06-10 NOTE — Assessment & Plan Note (Signed)
He will update me about his PPI dose.  Continue baseline dose as is.  He agrees.

## 2020-06-10 NOTE — Assessment & Plan Note (Signed)
Avoid NSAIDs.  See notes on imaging.  Refer to physical therapy.  He agrees.

## 2020-06-13 ENCOUNTER — Ambulatory Visit (INDEPENDENT_AMBULATORY_CARE_PROVIDER_SITE_OTHER): Payer: Medicare Other

## 2020-06-13 ENCOUNTER — Other Ambulatory Visit: Payer: Self-pay

## 2020-06-13 ENCOUNTER — Other Ambulatory Visit (INDEPENDENT_AMBULATORY_CARE_PROVIDER_SITE_OTHER): Payer: Medicare Other

## 2020-06-13 DIAGNOSIS — E538 Deficiency of other specified B group vitamins: Secondary | ICD-10-CM

## 2020-06-13 DIAGNOSIS — I10 Essential (primary) hypertension: Secondary | ICD-10-CM

## 2020-06-13 MED ORDER — CYANOCOBALAMIN 1000 MCG/ML IJ SOLN
1000.0000 ug | Freq: Once | INTRAMUSCULAR | Status: AC
  Administered 2020-06-13: 1000 ug via INTRAMUSCULAR

## 2020-06-13 NOTE — Progress Notes (Signed)
Per orders of Dr. Damita Dunnings, injection of B12  given by Randall An. Patient tolerated injection well.

## 2020-06-14 ENCOUNTER — Other Ambulatory Visit: Payer: Self-pay | Admitting: Family Medicine

## 2020-06-14 DIAGNOSIS — M25519 Pain in unspecified shoulder: Secondary | ICD-10-CM

## 2020-06-14 LAB — BASIC METABOLIC PANEL
BUN: 23 mg/dL (ref 6–23)
CO2: 26 mEq/L (ref 19–32)
Calcium: 9.3 mg/dL (ref 8.4–10.5)
Chloride: 108 mEq/L (ref 96–112)
Creatinine, Ser: 1.95 mg/dL — ABNORMAL HIGH (ref 0.40–1.50)
GFR: 32.64 mL/min — ABNORMAL LOW (ref >60.00)
Glucose, Bld: 75 mg/dL (ref 70–99)
Potassium: 4.4 mEq/L (ref 3.5–5.1)
Sodium: 139 mEq/L (ref 135–145)

## 2020-06-24 DIAGNOSIS — M25512 Pain in left shoulder: Secondary | ICD-10-CM | POA: Diagnosis not present

## 2020-07-01 DIAGNOSIS — M25512 Pain in left shoulder: Secondary | ICD-10-CM | POA: Diagnosis not present

## 2020-07-01 DIAGNOSIS — Z23 Encounter for immunization: Secondary | ICD-10-CM | POA: Diagnosis not present

## 2020-07-04 DIAGNOSIS — M25512 Pain in left shoulder: Secondary | ICD-10-CM | POA: Diagnosis not present

## 2020-07-08 DIAGNOSIS — M25512 Pain in left shoulder: Secondary | ICD-10-CM | POA: Diagnosis not present

## 2020-07-11 DIAGNOSIS — M25512 Pain in left shoulder: Secondary | ICD-10-CM | POA: Diagnosis not present

## 2020-07-12 ENCOUNTER — Telehealth: Payer: Self-pay | Admitting: Family Medicine

## 2020-07-12 DIAGNOSIS — M2550 Pain in unspecified joint: Secondary | ICD-10-CM

## 2020-07-12 NOTE — Telephone Encounter (Signed)
Referral Request - Has patient seen PCP for this complaint? Yes.   *If NO, is insurance requiring patient see PCP for this issue before PCP can refer them? Referral for which specialty: Rheumatology Preferred provider/office: [facility]// Dr.Patel Reason for referral: Arthritis pain getting worse

## 2020-07-15 NOTE — Telephone Encounter (Signed)
I put in the referral.  Thanks.  

## 2020-07-17 ENCOUNTER — Other Ambulatory Visit: Payer: Self-pay

## 2020-07-17 ENCOUNTER — Ambulatory Visit (INDEPENDENT_AMBULATORY_CARE_PROVIDER_SITE_OTHER): Payer: Medicare Other

## 2020-07-17 DIAGNOSIS — Z23 Encounter for immunization: Secondary | ICD-10-CM

## 2020-07-17 DIAGNOSIS — E538 Deficiency of other specified B group vitamins: Secondary | ICD-10-CM | POA: Diagnosis not present

## 2020-07-17 MED ORDER — CYANOCOBALAMIN 1000 MCG/ML IJ SOLN
1000.0000 ug | Freq: Once | INTRAMUSCULAR | Status: AC
  Administered 2020-07-17: 1000 ug via INTRAMUSCULAR

## 2020-07-17 NOTE — Progress Notes (Signed)
Per orders of Dr. Danise Mina in Dr. Josefine Class absence , injection of B12 and high dose flu vaccine given by Randall An. Patient tolerated injection well.

## 2020-07-18 ENCOUNTER — Telehealth: Payer: Self-pay | Admitting: Family Medicine

## 2020-07-18 NOTE — Telephone Encounter (Signed)
error 

## 2020-07-26 ENCOUNTER — Encounter: Payer: Self-pay | Admitting: Emergency Medicine

## 2020-07-26 ENCOUNTER — Observation Stay: Payer: Medicare Other

## 2020-07-26 ENCOUNTER — Emergency Department: Payer: Medicare Other

## 2020-07-26 ENCOUNTER — Observation Stay
Admission: EM | Admit: 2020-07-26 | Discharge: 2020-07-28 | Disposition: A | Discharge status: Home / Self Care | Payer: Medicare Other | Attending: Internal Medicine | Admitting: Internal Medicine

## 2020-07-26 ENCOUNTER — Other Ambulatory Visit: Payer: Self-pay

## 2020-07-26 DIAGNOSIS — Z79899 Other long term (current) drug therapy: Secondary | ICD-10-CM | POA: Insufficient documentation

## 2020-07-26 DIAGNOSIS — Z20822 Contact with and (suspected) exposure to covid-19: Secondary | ICD-10-CM | POA: Diagnosis not present

## 2020-07-26 DIAGNOSIS — Z85828 Personal history of other malignant neoplasm of skin: Secondary | ICD-10-CM | POA: Diagnosis not present

## 2020-07-26 DIAGNOSIS — G459 Transient cerebral ischemic attack, unspecified: Secondary | ICD-10-CM | POA: Diagnosis not present

## 2020-07-26 DIAGNOSIS — R531 Weakness: Secondary | ICD-10-CM | POA: Diagnosis not present

## 2020-07-26 DIAGNOSIS — R9082 White matter disease, unspecified: Secondary | ICD-10-CM | POA: Diagnosis not present

## 2020-07-26 DIAGNOSIS — R42 Dizziness and giddiness: Secondary | ICD-10-CM | POA: Diagnosis not present

## 2020-07-26 DIAGNOSIS — R29810 Facial weakness: Secondary | ICD-10-CM | POA: Diagnosis not present

## 2020-07-26 DIAGNOSIS — N184 Chronic kidney disease, stage 4 (severe): Secondary | ICD-10-CM | POA: Diagnosis not present

## 2020-07-26 DIAGNOSIS — Z87891 Personal history of nicotine dependence: Secondary | ICD-10-CM | POA: Diagnosis not present

## 2020-07-26 DIAGNOSIS — I129 Hypertensive chronic kidney disease with stage 1 through stage 4 chronic kidney disease, or unspecified chronic kidney disease: Secondary | ICD-10-CM | POA: Insufficient documentation

## 2020-07-26 DIAGNOSIS — R262 Difficulty in walking, not elsewhere classified: Secondary | ICD-10-CM | POA: Diagnosis not present

## 2020-07-26 DIAGNOSIS — G319 Degenerative disease of nervous system, unspecified: Secondary | ICD-10-CM | POA: Diagnosis not present

## 2020-07-26 DIAGNOSIS — I10 Essential (primary) hypertension: Secondary | ICD-10-CM | POA: Diagnosis present

## 2020-07-26 DIAGNOSIS — Z7982 Long term (current) use of aspirin: Secondary | ICD-10-CM | POA: Insufficient documentation

## 2020-07-26 DIAGNOSIS — E785 Hyperlipidemia, unspecified: Secondary | ICD-10-CM | POA: Diagnosis not present

## 2020-07-26 DIAGNOSIS — K2270 Barrett's esophagus without dysplasia: Secondary | ICD-10-CM | POA: Diagnosis present

## 2020-07-26 DIAGNOSIS — N183 Chronic kidney disease, stage 3 unspecified: Secondary | ICD-10-CM | POA: Diagnosis present

## 2020-07-26 DIAGNOSIS — R55 Syncope and collapse: Secondary | ICD-10-CM | POA: Diagnosis not present

## 2020-07-26 DIAGNOSIS — I6523 Occlusion and stenosis of bilateral carotid arteries: Secondary | ICD-10-CM | POA: Diagnosis not present

## 2020-07-26 DIAGNOSIS — R001 Bradycardia, unspecified: Secondary | ICD-10-CM | POA: Diagnosis not present

## 2020-07-26 DIAGNOSIS — I619 Nontraumatic intracerebral hemorrhage, unspecified: Secondary | ICD-10-CM | POA: Diagnosis not present

## 2020-07-26 DIAGNOSIS — I4510 Unspecified right bundle-branch block: Secondary | ICD-10-CM | POA: Diagnosis not present

## 2020-07-26 DIAGNOSIS — Z8673 Personal history of transient ischemic attack (TIA), and cerebral infarction without residual deficits: Secondary | ICD-10-CM | POA: Diagnosis not present

## 2020-07-26 LAB — PROTIME-INR
INR: 0.9 (ref 0.8–1.2)
Prothrombin Time: 12 seconds (ref 11.4–15.2)

## 2020-07-26 LAB — COMPREHENSIVE METABOLIC PANEL
ALT: 12 U/L (ref 0–44)
AST: 16 U/L (ref 15–41)
Albumin: 4 g/dL (ref 3.5–5.0)
Alkaline Phosphatase: 76 U/L (ref 38–126)
Anion gap: 7 (ref 5–15)
BUN: 24 mg/dL — ABNORMAL HIGH (ref 8–23)
CO2: 25 mmol/L (ref 22–32)
Calcium: 9.1 mg/dL (ref 8.9–10.3)
Chloride: 107 mmol/L (ref 98–111)
Creatinine, Ser: 2.03 mg/dL — ABNORMAL HIGH (ref 0.61–1.24)
GFR, Estimated: 31 mL/min — ABNORMAL LOW (ref >60)
Glucose, Bld: 100 mg/dL — ABNORMAL HIGH (ref 70–99)
Potassium: 4.3 mmol/L (ref 3.5–5.1)
Sodium: 139 mmol/L (ref 135–145)
Total Bilirubin: 0.7 mg/dL (ref 0.3–1.2)
Total Protein: 8.1 g/dL (ref 6.5–8.1)

## 2020-07-26 LAB — DIFFERENTIAL
Abs Immature Granulocytes: 0.01 10*3/uL (ref 0.00–0.07)
Basophils Absolute: 0.1 10*3/uL (ref 0.0–0.1)
Basophils Relative: 1 %
Eosinophils Absolute: 0.2 10*3/uL (ref 0.0–0.5)
Eosinophils Relative: 5 %
Immature Granulocytes: 0 %
Lymphocytes Relative: 25 %
Lymphs Abs: 1.3 10*3/uL (ref 0.7–4.0)
Monocytes Absolute: 0.5 10*3/uL (ref 0.1–1.0)
Monocytes Relative: 9 %
Neutro Abs: 3.2 10*3/uL (ref 1.7–7.7)
Neutrophils Relative %: 60 %

## 2020-07-26 LAB — RESPIRATORY PANEL BY RT PCR (FLU A&B, COVID)
Influenza A by PCR: NEGATIVE
Influenza B by PCR: NEGATIVE
SARS Coronavirus 2 by RT PCR: NEGATIVE

## 2020-07-26 LAB — CBC
HCT: 37.5 % — ABNORMAL LOW (ref 39.0–52.0)
Hemoglobin: 12.6 g/dL — ABNORMAL LOW (ref 13.0–17.0)
MCH: 30.1 pg (ref 26.0–34.0)
MCHC: 33.6 g/dL (ref 30.0–36.0)
MCV: 89.5 fL (ref 80.0–100.0)
Platelets: 157 10*3/uL (ref 150–400)
RBC: 4.19 MIL/uL — ABNORMAL LOW (ref 4.22–5.81)
RDW: 13.3 % (ref 11.5–15.5)
WBC: 5.2 10*3/uL (ref 4.0–10.5)
nRBC: 0 % (ref 0.0–0.2)

## 2020-07-26 LAB — APTT: aPTT: 28 seconds (ref 24–36)

## 2020-07-26 MED ORDER — SODIUM CHLORIDE 0.9 % IV SOLN: INTRAVENOUS | Status: DC

## 2020-07-26 MED ORDER — ACETAMINOPHEN 325 MG PO TABS
650.0000 mg | ORAL_TABLET | ORAL | Status: DC | PRN
  Administered 2020-07-27: 650 mg via ORAL
  Filled 2020-07-26: qty 2

## 2020-07-26 MED ORDER — ASPIRIN EC 81 MG PO TBEC
81.0000 mg | DELAYED_RELEASE_TABLET | Freq: Every day | ORAL | Status: DC
  Administered 2020-07-26 – 2020-07-28 (×3): 81 mg via ORAL
  Filled 2020-07-26 (×3): qty 1

## 2020-07-26 MED ORDER — LAMOTRIGINE 25 MG PO TABS
25.0000 mg | ORAL_TABLET | Freq: Every day | ORAL | Status: DC
  Administered 2020-07-26 – 2020-07-27 (×2): 25 mg via ORAL
  Filled 2020-07-26 (×3): qty 1

## 2020-07-26 MED ORDER — ACETAMINOPHEN 650 MG RE SUPP: 650.0000 mg | RECTAL | Status: DC | PRN

## 2020-07-26 MED ORDER — PANTOPRAZOLE SODIUM 40 MG PO TBEC
40.0000 mg | DELAYED_RELEASE_TABLET | Freq: Every day | ORAL | Status: DC
  Administered 2020-07-26 – 2020-07-28 (×3): 40 mg via ORAL
  Filled 2020-07-26 (×3): qty 1

## 2020-07-26 MED ORDER — ACETAMINOPHEN 160 MG/5ML PO SOLN
650.0000 mg | ORAL | Status: DC | PRN
  Filled 2020-07-26: qty 20.3

## 2020-07-26 MED ORDER — STROKE: EARLY STAGES OF RECOVERY BOOK: Freq: Once | Status: AC

## 2020-07-26 MED ORDER — ENOXAPARIN SODIUM 40 MG/0.4ML ~~LOC~~ SOLN: 40.0000 mg | SUBCUTANEOUS | Status: DC

## 2020-07-26 MED ORDER — CLOPIDOGREL BISULFATE 75 MG PO TABS
75.0000 mg | ORAL_TABLET | Freq: Every day | ORAL | Status: DC
  Administered 2020-07-26 – 2020-07-28 (×3): 75 mg via ORAL
  Filled 2020-07-26 (×3): qty 1

## 2020-07-26 MED ORDER — ROSUVASTATIN CALCIUM 20 MG PO TABS
20.0000 mg | ORAL_TABLET | Freq: Every day | ORAL | Status: DC
  Administered 2020-07-27 – 2020-07-28 (×2): 20 mg via ORAL
  Filled 2020-07-26 (×2): qty 1

## 2020-07-26 MED ORDER — SENNOSIDES-DOCUSATE SODIUM 8.6-50 MG PO TABS: 1.0000 | ORAL_TABLET | Freq: Every evening | ORAL | Status: DC | PRN

## 2020-07-26 MED ORDER — RAMELTEON 8 MG PO TABS
8.0000 mg | ORAL_TABLET | Freq: Every day | ORAL | Status: DC
  Administered 2020-07-26 – 2020-07-27 (×2): 8 mg via ORAL
  Filled 2020-07-26 (×3): qty 1

## 2020-07-26 MED ORDER — ENOXAPARIN SODIUM 30 MG/0.3ML ~~LOC~~ SOLN
30.0000 mg | SUBCUTANEOUS | Status: DC
  Administered 2020-07-26 – 2020-07-27 (×2): 30 mg via SUBCUTANEOUS
  Filled 2020-07-26 (×3): qty 0.3

## 2020-07-26 NOTE — ED Triage Notes (Signed)
Pt to ED via [facility] with c/o dizziness from home. Per EMS pt was shopping at [place] this morning and came home with c/o sudden onset dizziness and weakness in the bathroom. EMS reports numbness to L arm with dizziness/weakness, no motor deficit with weakness. Per EMS pt sat down in the bathroom PTA, no traumatic fall. EMS reports negative stroke scale, numbness resolved on EMS arrival.  Per EMS pt with hx of TIA and HTN, recently taken off of his Lisinopril for HTN.    188/90 54-70HR 99% RA 18RA CBG 111 97.5 Oral
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
## 2020-07-26 NOTE — ED Notes (Signed)
Report received from [facility]. Pt transported by this RN to room 31. Pt able to stand and walk to transfer onto hospital bed. Vitals obtained. Pt given warm blankets and instructed on use of call bell and HOB adjustment. Denies needs or further needs at this time. Lights dimmed for rest.
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
## 2020-07-26 NOTE — ED Notes (Signed)
Sent rainbow to lab. 

## 2020-07-26 NOTE — ED Notes (Signed)
Pt disconnected from monitor and ambulatory to bathroom. Pt reconnected to monitor upon return to room. HOB adjusted down per request.

## 2020-07-26 NOTE — Progress Notes (Signed)
PHARMACIST - PHYSICIAN COMMUNICATION  CONCERNING:  Enoxaparin (Lovenox) for DVT Prophylaxis    RECOMMENDATION: Patient was prescribed enoxaprin 40mg  q24 hours for VTE prophylaxis.   Filed Weights   07/26/20 1451  Weight: 75.3 kg (166 lb)    Body mass index is 25.24 kg/m.  Estimated Creatinine Clearance: 24.3 mL/min (A) (by C-G formula based on SCr of 2.03 mg/dL (H)).  Patient is candidate for enoxaparin 30mg  every 24 hours based on CrCl <61ml/min or Weight <45kg  DESCRIPTION: Pharmacy has adjusted enoxaparin dose per [facility] policy.  Patient is now receiving enoxaparin 30 mg every 24 hours    Vira Blanco, PharmD Clinical Pharmacist  07/26/2020 9:19 PM

## 2020-07-27 ENCOUNTER — Observation Stay: Admit: 2020-07-27 | Payer: Medicare Other

## 2020-07-27 ENCOUNTER — Observation Stay: Payer: Medicare Other

## 2020-07-27 DIAGNOSIS — I708 Atherosclerosis of other arteries: Secondary | ICD-10-CM | POA: Diagnosis not present

## 2020-07-27 DIAGNOSIS — I10 Essential (primary) hypertension: Secondary | ICD-10-CM | POA: Diagnosis not present

## 2020-07-27 DIAGNOSIS — I63531 Cerebral infarction due to unspecified occlusion or stenosis of right posterior cerebral artery: Secondary | ICD-10-CM | POA: Diagnosis not present

## 2020-07-27 DIAGNOSIS — I672 Cerebral atherosclerosis: Secondary | ICD-10-CM | POA: Diagnosis not present

## 2020-07-27 DIAGNOSIS — E785 Hyperlipidemia, unspecified: Secondary | ICD-10-CM

## 2020-07-27 DIAGNOSIS — I6601 Occlusion and stenosis of right middle cerebral artery: Secondary | ICD-10-CM | POA: Diagnosis not present

## 2020-07-27 DIAGNOSIS — G459 Transient cerebral ischemic attack, unspecified: Secondary | ICD-10-CM | POA: Diagnosis not present

## 2020-07-27 LAB — CBC
HCT: 33.6 % — ABNORMAL LOW (ref 39.0–52.0)
Hemoglobin: 11.3 g/dL — ABNORMAL LOW (ref 13.0–17.0)
MCH: 30 pg (ref 26.0–34.0)
MCHC: 33.6 g/dL (ref 30.0–36.0)
MCV: 89.1 fL (ref 80.0–100.0)
Platelets: 144 10*3/uL — ABNORMAL LOW (ref 150–400)
RBC: 3.77 MIL/uL — ABNORMAL LOW (ref 4.22–5.81)
RDW: 13.2 % (ref 11.5–15.5)
WBC: 4.9 10*3/uL (ref 4.0–10.5)
nRBC: 0 % (ref 0.0–0.2)

## 2020-07-27 LAB — COMPREHENSIVE METABOLIC PANEL
ALT: 10 U/L (ref 0–44)
AST: 14 U/L — ABNORMAL LOW (ref 15–41)
Albumin: 3.5 g/dL (ref 3.5–5.0)
Alkaline Phosphatase: 65 U/L (ref 38–126)
Anion gap: 4 — ABNORMAL LOW (ref 5–15)
BUN: 22 mg/dL (ref 8–23)
CO2: 25 mmol/L (ref 22–32)
Calcium: 8.3 mg/dL — ABNORMAL LOW (ref 8.9–10.3)
Chloride: 112 mmol/L — ABNORMAL HIGH (ref 98–111)
Creatinine, Ser: 1.84 mg/dL — ABNORMAL HIGH (ref 0.61–1.24)
GFR, Estimated: 35 mL/min — ABNORMAL LOW (ref >60)
Glucose, Bld: 104 mg/dL — ABNORMAL HIGH (ref 70–99)
Potassium: 4 mmol/L (ref 3.5–5.1)
Sodium: 141 mmol/L (ref 135–145)
Total Bilirubin: 0.6 mg/dL (ref 0.3–1.2)
Total Protein: 6.8 g/dL (ref 6.5–8.1)

## 2020-07-27 LAB — LIPID PANEL
Cholesterol: 117 mg/dL (ref 0–200)
HDL: 47 mg/dL (ref >40)
LDL Cholesterol: 54 mg/dL (ref 0–99)
Total CHOL/HDL Ratio: 2.5 RATIO
Triglycerides: 79 mg/dL (ref <150)
VLDL: 16 mg/dL (ref 0–40)

## 2020-07-27 MED ORDER — AMLODIPINE BESYLATE 10 MG PO TABS
10.0000 mg | ORAL_TABLET | Freq: Every day | ORAL | Status: DC
  Administered 2020-07-27 – 2020-07-28 (×2): 10 mg via ORAL
  Filled 2020-07-27: qty 2
  Filled 2020-07-27: qty 1

## 2020-07-27 MED ORDER — HYDRALAZINE HCL 50 MG PO TABS: 50.0000 mg | ORAL_TABLET | Freq: Four times a day (QID) | ORAL | Status: DC | PRN

## 2020-07-27 NOTE — Evaluation (Addendum)
Physical Therapy Evaluation Patient Details Name: KWALI WRINKLE MRN: [id number] DOB: 07/02/1930 Today's Date: 07/27/2020   History of Present Illness  THEODEN MAUCH is a 86 y.o. male with medical history significant of hypertension, hyperlipidemia, GERD, chronic kidney disease stage III basal cell carcinoma, previous GI bleed, kidney stones and previous TIAs in 2016 who was brought in with weakness numbness and difficulty walking. MRI negative for acute infarcts. Patient reports symptoms have resolved since admission.  Clinical Impression  Upon evaluation, patient alert and oriented; follows commands and demonstrates good effort with mobility tasks.  Bilat UE/LE strength and ROM grossly symmetrical and WFL; no focal weakness appreciated.  Able to complete bed mobility with indep; sit/stand, basic transfers and gait (150') without assist device, mod indep/indep.  Demonstrates reciprocal stepping pattern, decreased step height/length; forward flexed posture with excessive anterior weight shift, forward acceleration at times.  Mild dynamic balance deficits with sudden start/stop during gait distance, but does self-correct with increased time. Reports gait pattern/performance is near baseline for him. Would benefit from skilled PT to address above deficits and promote optimal return to PLOF.; recommend transition to home with outpatient PT follow up to address postural control and higher-level balance deficits.    Follow Up Recommendations Outpatient PT    Equipment Recommendations       Recommendations for Other Services       Precautions / Restrictions Precautions Precautions: Fall Precaution Comments: Low fall Restrictions Weight Bearing Restrictions: No      Mobility  Bed Mobility Overal bed mobility: Independent             General bed mobility comments: Pt performs sup<>sit on flat bed w/o assist this date. Min increased time to perform.    Transfers Overall transfer  level: Modified independent Equipment used: None Transfers: Sit to/from Stand Sit to Stand: Modified independent (Device/Increase time)         General transfer comment: good LE strength/power with movement transition  Ambulation/Gait Ambulation/Gait assistance: Supervision;Min guard Gait Distance (Feet): 150 Feet Assistive device: None       General Gait Details: reciprocal stepping pattern, decreased step height/length; forward flexed posture with excessive anterior weight shift, forward acceleration at times.  R posterior/lateral hip weakness in stance; excessive R pelvis on femur abduct throughout gait cycle (with contralateral trunk lean to L).  Mild dynamic balance deficits with sudden start/stop during gait distance, but does self-correct with increased time.  Stairs            Wheelchair Mobility    Modified Rankin (Stroke Patients Only)       Balance Overall balance assessment: Needs assistance Sitting-balance support: No upper extremity supported;Feet supported Sitting balance-Leahy Scale: Good     Standing balance support: No upper extremity supported Standing balance-Leahy Scale: Fair                               Pertinent Vitals/Pain Pain Assessment: No/denies pain Pain Score: 2  Pain Location: R hand arthritic pain Pain Intervention(s): Limited activity within patient's tolerance;Monitored during session    [place] expects to be discharged to:: Private residence Living Arrangements: Spouse/significant other Available Help at Discharge: Available 24 hours/day Type of Home: House Home Access: Stairs to enter Entrance Stairs-Rails: None Entrance Stairs-Number of Steps: 3 Home Layout: Two level;Bed/bath upstairs Home Equipment: None      Prior Function Level of Independence: Independent  Comments: Pt reports he is independent with ADL/IADL management and all mobility at baseline. + driving. Denies  falls history in past year.     Hand Dominance   Dominant Hand: Right    Extremity/Trunk Assessment   Upper Extremity Assessment Upper Extremity Assessment: Overall WFL for tasks assessed RUE Deficits / Details: Pt endorses he was recently diagnosed with arthritis in his R hand. He states he has f/u Dr. appt in next couple of weeks with [facility] clinic. 2nd--4th MCPs notably swollen as compared to LUE. Pt states it minimally impacts functional performance with tasks such as writing. RUE Coordination: decreased fine motor    Lower Extremity Assessment Lower Extremity Assessment: Overall WFL for tasks assessed (grossly 4+/5 throughout; no focal weakness, sensory or coordination deficit appreciated)    Cervical / Trunk Assessment Cervical / Trunk Assessment: Kyphotic Cervical / Trunk Exceptions: forward head, rounded shoulders  Communication   Communication: No difficulties  Cognition Arousal/Alertness: Awake/alert Behavior During Therapy: WFL for tasks assessed/performed Overall Cognitive Status: Within Functional Limits for tasks assessed                                 General Comments: A&O x4, pleasant conversational t/o evaluation.      General Comments      Exercises Other Exercises Other Exercises: Pt educated on role of OT in acute setting, BE FAST stroke symptom recognition and response edu, and falls prevention for home/hospital. No further skilled needs.   Assessment/Plan    PT Assessment Patient needs continued PT services  PT Problem List Decreased balance;Decreased mobility       PT Treatment Interventions DME instruction;Gait training;Stair training;Functional mobility training;Therapeutic activities;Therapeutic exercise;Balance training;Neuromuscular re-education;Cognitive remediation;Patient/family education    PT Goals (Current goals can be found in the Care Plan section)  Acute Rehab PT Goals Patient Stated Goal: To go home PT Goal  Formulation: With patient Time For Goal Achievement: 08/10/20 Potential to Achieve Goals: Good Additional Goals Additional Goal #1: BERG balance assessment >45/56 for safety/indep with functional activities.    Frequency Min 2X/week   Barriers to discharge        Co-evaluation               AM-PAC PT "6 Clicks" Mobility  Outcome Measure Help needed turning from your back to your side while in a flat bed without using bedrails?: None Help needed moving from lying on your back to sitting on the side of a flat bed without using bedrails?: None Help needed moving to and from a bed to a chair (including a wheelchair)?: None Help needed standing up from a chair using your arms (e.g., wheelchair or bedside chair)?: None Help needed to walk in hospital room?: A Little Help needed climbing 3-5 steps with a railing? : A Little 6 Click Score: 22    End of Session   Activity Tolerance: Patient tolerated treatment well Patient left: in bed;with call bell/phone within reach Nurse Communication: Mobility status PT Visit Diagnosis: Difficulty in walking, not elsewhere classified (R26.2)    Time: 3419-6222 PT Time Calculation (min) (ACUTE ONLY): 14 min   Charges:   PT Evaluation $PT Eval Moderate Complexity: 1 Mod         Aislin Onofre H. Owens Shark, PT, DPT, NCS 07/27/20, 9:42 AM [phone]
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
## 2020-07-27 NOTE — Progress Notes (Addendum)
PROGRESS NOTE    Justin Ford  CLE:[id number] DOB: [birth year] DOA: 07/26/2020 PCP: [name], MD   Assessment & Plan:   Principal Problem:   TIA (transient ischemic attack) Active Problems:   HTN (hypertension)   HLD (hyperlipidemia)   Barrett esophagus   Chronic kidney disease, stage III (moderate) (HCC)   TIA: continue on aspirin, plavix & statin. MRI brain shows no acute intracranial abnormality, advanced generalized atrophy & numerous peripheral predominant chronic microhemorrhages consistent w/ cerebral amyloid angiopathy. US shows no significant carotid stenosis. Neuro consulted   Thrombocytopenia: etiology unclear. Will continue to monitor   HTN: continue on amlodipine. Hydralazine prn   HLD: continue on statin   GERD: w/ Barrett's esophagus. Continue on PPI    CKDIIIb: baseline Cr is unknown. Cr is trending down from day prior. Will continue to monitor    DVT prophylaxis: lovenox  Code Status: full  Family Communication: Disposition Plan: likely d/c back home   Status is: Observation  The patient remains OBS appropriate and will d/c before 2 midnights. waiting to finish TIA work-up   Dispo: The patient is from: Home              Anticipated d/c is to: Home              Anticipated d/c date is: 1 day              Patient currently is not medically stable to d/c.     Consultants:   Neuro    Procedures:    Antimicrobials:    Subjective: Pt c/o fatigue   Objective: Vitals:   07/27/20 0430 07/27/20 0500 07/27/20 0530 07/27/20 0600  BP: (!) 159/63 (!) 160/83 133/66 139/74  Pulse: (!) 50 (!) 56 (!) 50 (!) 48  Resp: 16  14   Temp:      TempSrc:      SpO2: 99% 99% 96% 97%  Weight:      Height:       No intake or output data in the 24 hours ending 07/27/20 0749 Filed Weights   07/26/20 1451  Weight: 75.3 kg    Examination:  General exam: Appears calm and comfortable  Respiratory system: Clear to auscultation. Respiratory  effort normal. Cardiovascular system: S1 & S2 +. No rubs, gallops or clicks.. Gastrointestinal system: Abdomen is nondistended, soft and nontender.  Normal bowel sounds heard. Central nervous system: Alert and oriented. Moves all 4 extremities. CN 2-12 grossly intact  Psychiatry: Judgement and insight appear normal. Mood & affect appropriate.     Data Reviewed: I have personally reviewed following labs and imaging studies  CBC: Recent Labs  Lab 07/26/20 1455 07/27/20 0341  WBC 5.2 4.9  NEUTROABS 3.2  --   HGB 12.6* 11.3*  HCT 37.5* 33.6*  MCV 89.5 89.1  PLT 157 944*   Basic Metabolic Panel: Recent Labs  Lab 07/26/20 1455 07/27/20 0341  NA 139 141  K 4.3 4.0  CL 107 112*  CO2 25 25  GLUCOSE 100* 104*  BUN 24* 22  CREATININE 2.03* 1.84*  CALCIUM 9.1 8.3*   GFR: Estimated Creatinine Clearance: 26.8 mL/min (A) (by C-G formula based on SCr of 1.84 mg/dL (H)). Liver Function Tests: Recent Labs  Lab 07/26/20 1455 07/27/20 0341  AST 16 14*  ALT 12 10  ALKPHOS 76 65  BILITOT 0.7 0.6  PROT 8.1 6.8  ALBUMIN 4.0 3.5   No results for input(s): LIPASE, AMYLASE in the last 168  hours. No results for input(s): AMMONIA in the last 168 hours. Coagulation Profile: Recent Labs  Lab 07/26/20 1455  INR 0.9   Cardiac Enzymes: No results for input(s): CKTOTAL, CKMB, CKMBINDEX, TROPONINI in the last 168 hours. BNP (last 3 results) No results for input(s): PROBNP in the last 8760 hours. HbA1C: No results for input(s): HGBA1C in the last 72 hours. CBG: No results for input(s): GLUCAP in the last 168 hours. Lipid Profile: Recent Labs    07/27/20 0341  CHOL 117  HDL 47  LDLCALC 54  TRIG 79  CHOLHDL 2.5   Thyroid Function Tests: No results for input(s): TSH, T4TOTAL, FREET4, T3FREE, THYROIDAB in the last 72 hours. Anemia Panel: No results for input(s): VITAMINB12, FOLATE, FERRITIN, TIBC, IRON, RETICCTPCT in the last 72 hours. Sepsis Labs: No results for input(s):  PROCALCITON, LATICACIDVEN in the last 168 hours.  Recent Results (from the past 240 hour(s))  Respiratory Panel by RT PCR (Flu A&B, Covid) - Nasopharyngeal Swab     Status: None   Collection Time: 07/26/20  8:55 PM   Specimen: Nasopharyngeal Swab  Result Value Ref Range Status   SARS Coronavirus 2 by RT PCR NEGATIVE NEGATIVE Final    Comment: (NOTE) SARS-CoV-2 target nucleic acids are NOT DETECTED.  The SARS-CoV-2 RNA is generally detectable in upper respiratoy specimens during the acute phase of infection. The lowest concentration of SARS-CoV-2 viral copies this assay can detect is 131 copies/mL. A negative result does not preclude SARS-Cov-2 infection and should not be used as the sole basis for treatment or other patient management decisions. A negative result may occur with  improper specimen collection/handling, submission of specimen other than nasopharyngeal swab, presence of viral mutation(s) within the areas targeted by this assay, and inadequate number of viral copies (<131 copies/mL). A negative result must be combined with clinical observations, patient history, and epidemiological information. The expected result is Negative.  Fact Sheet for Patients:  PinkCheek.be  Fact Sheet for Healthcare Providers:  GravelBags.it  This test is no t yet approved or cleared by the [place] FDA and  has been authorized for detection and/or diagnosis of SARS-CoV-2 by FDA under an Emergency Use Authorization (EUA). This EUA will remain  in effect (meaning this test can be used) for the duration of the COVID-19 declaration under Section 564(b)(1) of the Act, 21 U.S.C. section 360bbb-3(b)(1), unless the authorization is terminated or revoked sooner.     Influenza A by PCR NEGATIVE NEGATIVE Final   Influenza B by PCR NEGATIVE NEGATIVE Final    Comment: (NOTE) The Xpert Xpress SARS-CoV-2/FLU/RSV assay is intended as an aid  in  the diagnosis of influenza from Nasopharyngeal swab specimens and  should not be used as a sole basis for treatment. Nasal washings and  aspirates are unacceptable for Xpert Xpress SARS-CoV-2/FLU/RSV  testing.  Fact Sheet for Patients: PinkCheek.be  Fact Sheet for Healthcare Providers: GravelBags.it  This test is not yet approved or cleared by the [place] FDA and  has been authorized for detection and/or diagnosis of SARS-CoV-2 by  FDA under an Emergency Use Authorization (EUA). This EUA will remain  in effect (meaning this test can be used) for the duration of the  Covid-19 declaration under Section 564(b)(1) of the Act, 21  U.S.C. section 360bbb-3(b)(1), unless the authorization is  terminated or revoked. Performed at [facility], [address], [place], [place] [address]          Radiology Studies: DG Chest 2 View  Result Date: 07/26/2020 CLINICAL DATA:  Dizziness EXAM: CHEST - 2 VIEW COMPARISON:  None. FINDINGS: The heart size and mediastinal contours are within normal limits. Both lungs are clear. The visualized skeletal structures are unremarkable. IMPRESSION: No active cardiopulmonary disease. Electronically Signed   By: Inez Catalina M.D.   On: 07/26/2020 21:37   CT HEAD WO CONTRAST  Result Date: 07/26/2020 CLINICAL DATA:  Dizziness. EXAM: CT HEAD WITHOUT CONTRAST TECHNIQUE: Contiguous axial images were obtained from the base of the skull through the vertex without intravenous contrast. COMPARISON:  September 23, 2015 FINDINGS: Brain: There is moderate severity cerebral atrophy with widening of the extra-axial spaces and ventricular dilatation. There are areas of decreased attenuation within the white matter tracts of the supratentorial brain, consistent with microvascular disease changes. Vascular: No hyperdense vessel or unexpected calcification. Skull: Normal. Negative for fracture or focal  lesion. Sinuses/Orbits: No acute finding. Other: None. IMPRESSION: 1. Generalized cerebral atrophy. 2. No acute intracranial abnormality. Electronically Signed   By: Virgina Norfolk M.D.   On: 07/26/2020 15:17   MR BRAIN WO CONTRAST  Result Date: 07/26/2020 CLINICAL DATA:  Transient ischemic attack EXAM: MRI HEAD WITHOUT CONTRAST TECHNIQUE: Multiplanar, multiecho pulse sequences of the brain and surrounding structures were obtained without intravenous contrast. COMPARISON:  04/13/2017 FINDINGS: Brain: No acute infarct, acute hemorrhage or extra-axial collection. Diffuse confluent hyperintense T2-weighted signal within the periventricular, deep and juxtacortical white matter. Advanced generalized atrophy. There are multiple old small vessel infarcts within the supratentorial white matter. Numerous scattered chronic microhemorrhages are predominantly peripheral. Normal midline structures. Vascular: Normal flow voids. Skull and upper cervical spine: Normal marrow signal. Sinuses/Orbits: Negative. Other: None. IMPRESSION: 1. No acute intracranial abnormality. 2. Advanced generalized atrophy and findings of chronic ischemic microangiopathy. 3. Numerous peripheral predominant chronic microhemorrhages, consistent with cerebral amyloid angiopathy. Electronically Signed   By: Ulyses Jarred M.D.   On: 07/26/2020 23:23        Scheduled Meds: .  stroke: mapping our early stages of recovery book   Does not apply Once  . aspirin EC  81 mg Oral Daily  . clopidogrel  75 mg Oral Daily  . enoxaparin (LOVENOX) injection  30 mg Subcutaneous Q24H  . lamoTRIgine  25 mg Oral QHS  . pantoprazole  40 mg Oral Daily  . ramelteon  8 mg Oral QHS  . rosuvastatin  20 mg Oral Daily   Continuous Infusions: . sodium chloride 75 mL/hr at 07/26/20 2146     LOS: 0 days    Time spent: 32 mins     Wyvonnia Dusky, MD [facility] Pager [phone]  If 7PM-7AM, please contact  night-coverage [email or web address] 07/27/2020, 7:49 AM

## 2020-07-27 NOTE — Progress Notes (Signed)
SLP Cancellation Note  Patient Details Name: Justin Ford MRN: [id number] DOB: 01-23-1930   Cancelled treatment:       Reason Eval/Treat Not Completed: SLP screened, no needs identified, will sign off (chart reviewed; consulted NSG then met w/ pt).  Pt denied any difficulty swallowing and is currently on a regular diet; tolerates swallowing pills w/ water per NSG. [facility] screen w/ NSG passed. Breakfast ordered for pt. Pt conversed at conversational level w/out deficits noted; pt and NSG denied any speech-language deficits during engagements this morning. Good sense of humor noted.  No further skilled ST services indicated as pt appears at his baseline. Pt agreed. NSG to reconsult if any change in status while admitted.     Orinda Kenner, MS, CCC-SLP Speech Language Pathologist [facility] [phone] [facility] 07/27/2020, 10:11 AM

## 2020-07-27 NOTE — Evaluation (Signed)
Occupational Therapy Evaluation Patient Details Name: Justin Ford MRN: [id number] DOB: Oct 20, 1929 Today's Date: 07/27/2020    History of Present Illness Justin Ford is a 86 y.o. male with medical history significant of hypertension, hyperlipidemia, GERD, chronic kidney disease stage III basal cell carcinoma, previous GI bleed, kidney stones and previous TIAs in 2016 who was brought in with weakness numbness and difficulty walking. MRI negative for acute infarcts.   Clinical Impression   Justin Ford was seen for OT evaluation this date. Prior to hospital admission, pt was independent in all aspects of ADL/IADL, and denies falls history in past 12 months. Pt lives with his spouse in a 2 story home with 3 steps to enter. Currently pt reporting symptoms have resolved. Pt only complaint this date is mild pain in his RUE from "arthritis". Pt states he was recently diagnosed with arthritis in his R hand. Pt R hand noted to be swollen at 2nd-4th MCPs. Pt educated on role of OP OT for hand therapy consult if desired/arthritis continues to impact functional performance for ADL/IADL management. Pt voices plans for f/u with MD in next month to discuss options. BE FAST stroke sign/symptom recognition and response education completed with pt. He return verbalizes understanding. Pt demonstrates baseline independence to perform ADL and mobility tasks and no strength, sensory, coordination, cognitive, or visual deficits appreciated with assessment. No further skilled OT needs identified. Will sign off. MD/RN notified. Please re-consult if additional OT needs arise.     Follow Up Recommendations  No OT follow up    Equipment Recommendations  None recommended by OT    Recommendations for Other Services       Precautions / Restrictions Precautions Precautions: Fall Precaution Comments: Low fall      Mobility Bed Mobility Overal bed mobility: Independent             General bed mobility  comments: Pt performs sup<>sit on flat bed w/o assist this date. Min increased time to perform.    Transfers Overall transfer level: Independent Equipment used: None             General transfer comment: Pt independently stands from bed in lowest position. Good safety awareness and control.    Balance Overall balance assessment: No apparent balance deficits (not formally assessed)                                         ADL either performed or assessed with clinical judgement   ADL Overall ADL's : At baseline                                       General ADL Comments: Pt presents at baseline for functional indep with ADL mgt. He endorses all presenting symptoms have resolved. He is able to perform bed mobility, don/doff B shoes, and STS independently. Per pt/nsg notes, pt is up to restroom with SBA for mgt of lines/leads. No skilled OT needs identified.     Vision Baseline Vision/History: Wears glasses Wears Glasses: At all times Patient Visual Report: No change from baseline       Perception     Praxis      Pertinent Vitals/Pain Pain Assessment: Faces Pain Score: 2  Pain Location: R hand arthritic pain Pain Intervention(s): Limited activity within patient's tolerance;Monitored  during session     Hand Dominance Right   Extremity/Trunk Assessment Upper Extremity Assessment Upper Extremity Assessment: Overall WFL for tasks assessed;RUE deficits/detail RUE Deficits / Details: Pt endorses he was recently diagnosed with arthritis in his R hand. He states he has f/u Dr. appt in next couple of weeks with [facility] clinic. 2nd--4th MCPs notably swollen as compared to LUE. Pt states it minimally impacts functional performance with tasks such as writing. RUE Coordination: decreased fine motor   Lower Extremity Assessment Lower Extremity Assessment: Overall WFL for tasks assessed   Cervical / Trunk Assessment Cervical / Trunk Assessment:  Other exceptions Cervical / Trunk Exceptions: Forward head/shoulders.   Communication Communication Communication: No difficulties   Cognition Arousal/Alertness: Awake/alert Behavior During Therapy: WFL for tasks assessed/performed Overall Cognitive Status: Within Functional Limits for tasks assessed                                 General Comments: A&O x4, pleasant conversational t/o evaluation.   General Comments       Exercises Other Exercises Other Exercises: Pt educated on role of OT in acute setting, BE FAST stroke symptom recognition and response edu, and falls prevention for home/hospital. No further skilled needs.   Shoulder Instructions      Home Living Family/patient expects to be discharged to:: Private residence Living Arrangements: Spouse/significant other Available Help at Discharge: Available 24 hours/day Type of Home: House Home Access: Stairs to enter [employer] of Steps: 3 Entrance Stairs-Rails: None       Bathroom Shower/Tub: Occupational psychologist: Standard (c riser)     Home Equipment: None          Prior Functioning/Environment Level of Independence: Independent        Comments: Pt reports he is independent with ADL/IADL management at baseline. + driving. Denies falls history in past year.        OT Problem List: Impaired sensation;Impaired balance (sitting and/or standing)      OT Treatment/Interventions:      OT Goals(Current goals can be found in the care plan section) Acute Rehab OT Goals Patient Stated Goal: To go home OT Goal Formulation: All assessment and education complete, DC therapy Time For Goal Achievement: 07/27/20 Potential to Achieve Goals: Good  OT Frequency:     Barriers to D/C:            Co-evaluation              AM-PAC OT "6 Clicks" Daily Activity     Outcome Measure Help from another person eating meals?: None Help from another person taking care of personal  grooming?: None Help from another person toileting, which includes using toliet, bedpan, or urinal?: None Help from another person bathing (including washing, rinsing, drying)?: None Help from another person to put on and taking off regular upper body clothing?: None Help from another person to put on and taking off regular lower body clothing?: None 6 Click Score: 24   End of Session Nurse Communication: Mobility status;Other (comment) (OT signing off.)  Activity Tolerance: Patient tolerated treatment well Patient left: in bed;with call bell/phone within reach (Pt recieved with bed alarm off, low fall risk per chart.)  OT Visit Diagnosis: Other abnormalities of gait and mobility (R26.89);Other symptoms and signs involving the nervous system (N98.921)                Time: [phone]  OT Time Calculation (min): 14 min Charges:  OT General Charges $OT Visit: 1 Visit OT Evaluation $OT Eval Low Complexity: 1 Low OT Treatments $Self Care/Home Management : 8-22 mins  Shara Blazing, M.S., OTR/L Ascom: [phone] 07/27/20, 9:16 AM

## 2020-07-27 NOTE — ED Notes (Signed)
Patient assisted to bathroom 

## 2020-07-27 NOTE — ED Notes (Signed)
Pt ambulatory to restroom

## 2020-07-27 NOTE — ED Notes (Signed)
Pt ambulatory to restroom no assistance

## 2020-07-27 NOTE — ED Notes (Addendum)
Pt complains that medication for sleep did not help him sleep. Pt also complains of dull arthritis pain in right hand. Pt informed he can have ordered tylenol for pain.

## 2020-07-28 ENCOUNTER — Observation Stay
Admit: 2020-07-28 | Discharge: 2020-07-28 | Disposition: A | Discharge status: Home / Self Care | Payer: Medicare Other | Attending: Internal Medicine | Admitting: Internal Medicine

## 2020-07-28 DIAGNOSIS — G459 Transient cerebral ischemic attack, unspecified: Secondary | ICD-10-CM | POA: Diagnosis not present

## 2020-07-28 DIAGNOSIS — I10 Essential (primary) hypertension: Secondary | ICD-10-CM | POA: Diagnosis not present

## 2020-07-28 DIAGNOSIS — E785 Hyperlipidemia, unspecified: Secondary | ICD-10-CM | POA: Diagnosis not present

## 2020-07-28 LAB — ECHOCARDIOGRAM COMPLETE
AR max vel: 1.93 cm2
AV Area VTI: 2.33 cm2
AV Area mean vel: 2.11 cm2
AV Mean grad: 4.5 mmHg
AV Peak grad: 7.7 mmHg
Ao pk vel: 1.39 m/s
Area-P 1/2: 3.02 cm2
Height: 68 in
S' Lateral: 2.63 cm
Weight: 2656 oz

## 2020-07-28 LAB — CBC
HCT: 32.9 % — ABNORMAL LOW (ref 39.0–52.0)
Hemoglobin: 10.8 g/dL — ABNORMAL LOW (ref 13.0–17.0)
MCH: 30 pg (ref 26.0–34.0)
MCHC: 32.8 g/dL (ref 30.0–36.0)
MCV: 91.4 fL (ref 80.0–100.0)
Platelets: 127 10*3/uL — ABNORMAL LOW (ref 150–400)
RBC: 3.6 MIL/uL — ABNORMAL LOW (ref 4.22–5.81)
RDW: 13.3 % (ref 11.5–15.5)
WBC: 4.5 10*3/uL (ref 4.0–10.5)
nRBC: 0 % (ref 0.0–0.2)

## 2020-07-28 LAB — BASIC METABOLIC PANEL
Anion gap: 7 (ref 5–15)
BUN: 25 mg/dL — ABNORMAL HIGH (ref 8–23)
CO2: 23 mmol/L (ref 22–32)
Calcium: 8.9 mg/dL (ref 8.9–10.3)
Chloride: 112 mmol/L — ABNORMAL HIGH (ref 98–111)
Creatinine, Ser: 1.84 mg/dL — ABNORMAL HIGH (ref 0.61–1.24)
GFR, Estimated: 35 mL/min — ABNORMAL LOW (ref >60)
Glucose, Bld: 96 mg/dL (ref 70–99)
Potassium: 4 mmol/L (ref 3.5–5.1)
Sodium: 142 mmol/L (ref 135–145)

## 2020-07-28 MED ORDER — AMLODIPINE BESYLATE 10 MG PO TABS: 10.0000 mg | ORAL_TABLET | Freq: Every day | ORAL | 0 refills | Status: DC
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
## 2020-07-28 NOTE — TOC Initial Note (Signed)
Transition of Care [facility]) - Initial/Assessment Note    Patient Details  Name: Justin Ford MRN: [id number] Date of Birth: November 15, 1929  Transition of Care [facility]) CM/SW Contact:    Elliot Gurney [place], [place] Phone Number: [phone] 07/28/2020, 2:20 PM  Clinical Narrative:                 Patient is 86 year old male who was brought in with weakness numbness and difficulty walking. Patient resides in a home with his spouse. Patient followed by Dr. Shon Hough. PT evaluated patient and recommended outpatient PT. Recommendation discussed with patient who states that he has been previously followed by [facility] and agrees to follow up there post discharge from the hospital.. Referral completed and signed by attending. Referral to be faxed to [facility], they will call patient to schedule. Patient to discharge home today. Patient's daughter will provide transport home.   [place], LCSW Transitions of Care [phone]   Expected Discharge Plan: OP Rehab Barriers to Discharge: No Barriers Identified   Patient Goals and CMS Choice Patient states their goals for this hospitalization and ongoing recovery are:: To return home      Expected Discharge Plan and Services Expected Discharge Plan: OP Rehab In-house Referral: Clinical Social Work Discharge Planning Services: Other - See comment (outpatient PT)   Living arrangements for the past 2 months: Single Family Home Expected Discharge Date: 07/28/20                         HH Arranged: NA          Prior Living Arrangements/Services Living arrangements for the past 2 months: Single Family Home Lives with:: Spouse, Minor Children Patient language and need for interpreter reviewed:: No Do you feel safe going back to the place where you live?: Yes      Need for Family Participation in Patient Care: No (Comment) Care giver support system in place?: Yes (comment)   Criminal  Activity/Legal Involvement Pertinent to Current Situation/Hospitalization: No - Comment as needed  Activities of Daily Living Home Assistive Devices/Equipment: None ADL Screening (condition at time of admission) Patient's cognitive ability adequate to safely complete daily activities?: Yes Is the patient deaf or have difficulty hearing?: Yes Does the patient have difficulty seeing, even when wearing glasses/contacts?: No Does the patient have difficulty concentrating, remembering, or making decisions?: No Patient able to express need for assistance with ADLs?: Yes Does the patient have difficulty dressing or bathing?: No Independently performs ADLs?: Yes (appropriate for developmental age) Does the patient have difficulty walking or climbing stairs?: Yes Weakness of Legs: None Weakness of Arms/Hands: None  Permission Sought/Granted                  Emotional Assessment Appearance:: Appears older than stated age Attitude/Demeanor/Rapport: Engaged, Self-Confident Affect (typically observed): Appropriate Orientation: : Oriented to Self, Oriented to Place, Oriented to  Time, Oriented to Situation Alcohol / Substance Use: Not Applicable Psych Involvement: No (comment)  Admission diagnosis:  TIA (transient ischemic attack) [G45.9] Stage 4 chronic kidney disease ([place]) [N18.4] Patient Active Problem List   Diagnosis Date Noted  . Gait abnormality 05/15/2020  . Angiodysplasia of intestinal tract   . Polyp of colon   . Melena   . Angiodysplasia of stomach   . Right foot drop 02/28/2017  . Left leg swelling 10/30/2016  . Gout 10/11/2016  . B12 deficiency 05/21/2016  . Bruising 05/21/2016  .  Tremor 03/08/2016  . Hamstring strain 11/05/2015  . Multiple falls 11/05/2015  . CVA (cerebral infarction) 09/23/2015  . Lower back pain 09/12/2015  . Shoulder pain 09/12/2015  . 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 TIA (transient ischemic attack) 06/21/2015  . Hyperglycemia  08/09/2013  . Greater trochanteric bursitis 08/09/2013  . Trigger finger 06/21/2012  . Advance directive on file 03/22/2012  . Medicare annual wellness visit, subsequent 03/15/2012  . Dupuytren contracture 03/15/2012  . Cough 12/11/2011  . Barrett esophagus 10/12/2011  . Chronic kidney disease, stage III (moderate) ([place]) 10/12/2011  . HTN (hypertension) 09/27/2011  . HLD (hyperlipidemia) 09/27/2011  . GERD (gastroesophageal reflux disease) 09/27/2011   PCP:  [name], MD Pharmacy:   [facility] [address], [place] - [place] [address] [place] [address] Phone: [phone] Fax: [phone]  [id number] [place], [address] [place] [address] Phone: [phone] Fax: [phone]     Social Determinants of Health (SDOH) Interventions    Readmission Risk Interventions No flowsheet data found.

## 2020-07-28 NOTE — Progress Notes (Signed)
*  PRELIMINARY RESULTS* Echocardiogram 2D Echocardiogram has been performed.  Sherrie Sport 07/28/2020, 9:04 AM

## 2020-07-28 NOTE — Progress Notes (Signed)
Subjective: No complaints. Feels subjectively ready for discharge.   Objective: Current vital signs: BP (!) 150/129 (BP Location: Right Arm)   Pulse 71   Temp 97.7 F (36.5 C) (Oral)   Resp 20   Ht 5\' 8"  (1.727 m)   Wt 75.3 kg   SpO2 96%   BMI 25.24 kg/m  Vital signs in last 24 hours: Temp:  [97.4 F (36.3 C)-97.9 F (36.6 C)] 97.7 F (36.5 C) (10/24 0735) Pulse Rate:  [52-71] 71 (10/24 0735) Resp:  [12-20] 20 (10/24 0735) BP: (120-163)/(57-129) 150/129 (10/24 0735) SpO2:  [96 %-99 %] 96 % (10/24 0735)  Intake/Output from previous day: 10/23 0701 - 10/24 0700 In: 2024.7 [I.V.:2024.7] Out: -  Intake/Output this shift: No intake/output data recorded. Nutritional status:  Diet Order            Diet Heart Room service appropriate? Yes; Fluid consistency: Thin  Diet effective now                 Physical Exam  HEENT-  [place]/AT   Lungs- Respirations unlabored Ext: No edema    Neurological Examination Mental Status: Alert and fully oriented. Speech fluent with intact comprehension. Cranial Nerves: II: Fixates and tracks normally.   III,IV, VI: No ptosis. EOMI. No nystagmus.   VII: Smile symmetric.  VIII: Hearing intact to voice IX,X: No hypophonia XI: Symmetric XII: No lingual dysarthria.  Motor: RUE: 4/5 grip (has hand pain from arthritis) and 4/5 deltoid (has weak shoulder from old rotator cuff injury), otherwise 5/5 RLE: 5/5 LUE 5/5 LLE: 4/5 hip flexion, otherwise 5/5 Cerebellar: No ataxia noted.     Lab Results: Results for orders placed or performed during the hospital encounter of 07/26/20 (from the past 48 hour(s))  Protime-INR     Status: None   Collection Time: 07/26/20  2:55 PM  Result Value Ref Range   Prothrombin Time 12.0 11.4 - 15.2 seconds   INR 0.9 0.8 - 1.2    Comment: (NOTE) INR goal varies based on device and disease states. Performed at [facility], [address] [place], [place] [address]   APTT     Status: None    Collection Time: 07/26/20  2:55 PM  Result Value Ref Range   aPTT 28 24 - 36 seconds    Comment: Performed at [facility], [address] [place], [place] [address]  CBC     Status: Abnormal   Collection Time: 07/26/20  2:55 PM  Result Value Ref Range   WBC 5.2 4.0 - 10.5 K/uL   RBC 4.19 (L) 4.22 - 5.81 MIL/uL   Hemoglobin 12.6 (L) 13.0 - 17.0 g/dL   HCT 37.5 (L) 39 - 52 %   MCV 89.5 80.0 - 100.0 fL   MCH 30.1 26.0 - 34.0 pg   MCHC 33.6 30.0 - 36.0 g/dL   RDW 13.3 11.5 - 15.5 %   Platelets 157 150 - 400 K/uL   nRBC 0.0 0.0 - 0.2 %    Comment: Performed at [facility] [address] [place], [place] [address]  Differential     Status: None   Collection Time: 07/26/20  2:55 PM  Result Value Ref Range   Neutrophils Relative % 60 %   Neutro Abs 3.2 1.7 - 7.7 K/uL   Lymphocytes Relative 25 %   Lymphs Abs 1.3 0.7 - 4.0 K/uL   Monocytes Relative 9 %   Monocytes Absolute 0.5 0.1 - 1.0 K/uL  Eosinophils Relative 5 %   Eosinophils Absolute 0.2 0.0 - 0.5 K/uL   Basophils Relative 1 %   Basophils Absolute 0.1 0.0 - 0.1 K/uL   Immature Granulocytes 0 %   Abs Immature Granulocytes 0.01 0.00 - 0.07 K/uL    Comment: Performed at [facility], [address] [place], [place] [address]  Comprehensive metabolic panel     Status: Abnormal   Collection Time: 07/26/20  2:55 PM  Result Value Ref Range   Sodium 139 135 - 145 mmol/L   Potassium 4.3 3.5 - 5.1 mmol/L   Chloride 107 98 - 111 mmol/L   CO2 25 22 - 32 mmol/L   Glucose, Bld 100 (H) 70 - 99 mg/dL    Comment: Glucose reference range applies only to samples taken after fasting for at least 8 hours.   BUN 24 (H) 8 - 23 mg/dL   Creatinine, Ser 2.03 (H) 0.61 - 1.24 mg/dL   Calcium 9.1 8.9 - 10.3 mg/dL   Total Protein 8.1 6.5 - 8.1 g/dL   Albumin 4.0 3.5 - 5.0 g/dL   AST 16 15 - 41 U/L   ALT 12 0 - 44 U/L   Alkaline Phosphatase 76 38 - 126 U/L   Total Bilirubin 0.7 0.3 - 1.2 mg/dL   GFR, Estimated 31  (L) >60 mL/min    Comment: (NOTE) Calculated using the CKD-EPI Creatinine Equation (2021)    Anion gap 7 5 - 15    Comment: Performed at [facility], [address], [place], [place] [address]  Respiratory Panel by RT PCR (Flu A&B, Covid) - Nasopharyngeal Swab     Status: None   Collection Time: 07/26/20  8:55 PM   Specimen: Nasopharyngeal Swab  Result Value Ref Range   SARS Coronavirus 2 by RT PCR NEGATIVE NEGATIVE    Comment: (NOTE) SARS-CoV-2 target nucleic acids are NOT DETECTED.  The SARS-CoV-2 RNA is generally detectable in upper respiratoy specimens during the acute phase of infection. The lowest concentration of SARS-CoV-2 viral copies this assay can detect is 131 copies/mL. A negative result does not preclude SARS-Cov-2 infection and should not be used as the sole basis for treatment or other patient management decisions. A negative result may occur with  improper specimen collection/handling, submission of specimen other than nasopharyngeal swab, presence of viral mutation(s) within the areas targeted by this assay, and inadequate number of viral copies (<131 copies/mL). A negative result must be combined with clinical observations, patient history, and epidemiological information. The expected result is Negative.  Fact Sheet for Patients:  PinkCheek.be  Fact Sheet for Healthcare Providers:  GravelBags.it  This test is no t yet approved or cleared by the [place] FDA and  has been authorized for detection and/or diagnosis of SARS-CoV-2 by FDA under an Emergency Use Authorization (EUA). This EUA will remain  in effect (meaning this test can be used) for the duration of the COVID-19 declaration under Section 564(b)(1) of the Act, 21 U.S.C. section 360bbb-3(b)(1), unless the authorization is terminated or revoked sooner.     Influenza A by PCR NEGATIVE NEGATIVE   Influenza B by PCR NEGATIVE  NEGATIVE    Comment: (NOTE) The Xpert Xpress SARS-CoV-2/FLU/RSV assay is intended as an aid in  the diagnosis of influenza from Nasopharyngeal swab specimens and  should not be used as a sole basis for treatment. Nasal washings and  aspirates are unacceptable for Xpert Xpress SARS-CoV-2/FLU/RSV  testing.  Fact Sheet for Patients: PinkCheek.be  Fact  Sheet for Healthcare Providers: GravelBags.it  This test is not yet approved or cleared by the [place] and  has been authorized for detection and/or diagnosis of SARS-CoV-2 by  FDA under an Emergency Use Authorization (EUA). This EUA will remain  in effect (meaning this test can be used) for the duration of the  Covid-19 declaration under Section 564(b)(1) of the Act, 21  U.S.C. section 360bbb-3(b)(1), unless the authorization is  terminated or revoked. Performed at [facility], [address] [place], [place] [address]   Lipid panel     Status: None   Collection Time: 07/27/20  3:41 AM  Result Value Ref Range   Cholesterol 117 0 - 200 mg/dL   Triglycerides 79 <150 mg/dL   HDL 47 >40 mg/dL   Total CHOL/HDL Ratio 2.5 RATIO   VLDL 16 0 - 40 mg/dL   LDL Cholesterol 54 0 - 99 mg/dL    Comment: Performed at [facility], [address] [place], [place] [address] CORRECTED ON 10/23 AT 0522: PREVIOUSLY REPORTED AS NOT CALCULATED   CBC     Status: Abnormal   Collection Time: 07/27/20  3:41 AM  Result Value Ref Range   WBC 4.9 4.0 - 10.5 K/uL   RBC 3.77 (L) 4.22 - 5.81 MIL/uL   Hemoglobin 11.3 (L) 13.0 - 17.0 g/dL   HCT 33.6 (L) 39 - 52 %   MCV 89.1 80.0 - 100.0 fL   MCH 30.0 26.0 - 34.0 pg   MCHC 33.6 30.0 - 36.0 g/dL   RDW 13.2 11.5 - 15.5 %   Platelets 144 (L) 150 - 400 K/uL   nRBC 0.0 0.0 - 0.2 %    Comment: Performed at [facility], [address] [place], [place] [address]  Comprehensive metabolic panel     Status: Abnormal    Collection Time: 07/27/20  3:41 AM  Result Value Ref Range   Sodium 141 135 - 145 mmol/L   Potassium 4.0 3.5 - 5.1 mmol/L   Chloride 112 (H) 98 - 111 mmol/L   CO2 25 22 - 32 mmol/L   Glucose, Bld 104 (H) 70 - 99 mg/dL    Comment: Glucose reference range applies only to samples taken after fasting for at least 8 hours.   BUN 22 8 - 23 mg/dL   Creatinine, Ser 1.84 (H) 0.61 - 1.24 mg/dL   Calcium 8.3 (L) 8.9 - 10.3 mg/dL   Total Protein 6.8 6.5 - 8.1 g/dL   Albumin 3.5 3.5 - 5.0 g/dL   AST 14 (L) 15 - 41 U/L   ALT 10 0 - 44 U/L   Alkaline Phosphatase 65 38 - 126 U/L   Total Bilirubin 0.6 0.3 - 1.2 mg/dL   GFR, Estimated 35 (L) >60 mL/min    Comment: (NOTE) Calculated using the CKD-EPI Creatinine Equation (2021)    Anion gap 4 (L) 5 - 15    Comment: Performed at [facility], [address] [place], [place] [address]  CBC     Status: Abnormal   Collection Time: 07/28/20  5:08 AM  Result Value Ref Range   WBC 4.5 4.0 - 10.5 K/uL   RBC 3.60 (L) 4.22 - 5.81 MIL/uL   Hemoglobin 10.8 (L) 13.0 - 17.0 g/dL   HCT 32.9 (L) 39 - 52 %   MCV 91.4 80.0 - 100.0 fL   MCH 30.0 26.0 - 34.0 pg   MCHC 32.8 30.0 - 36.0 g/dL   RDW  13.3 11.5 - 15.5 %   Platelets 127 (L) 150 - 400 K/uL   nRBC 0.0 0.0 - 0.2 %    Comment: Performed at [facility], [address] [place], [place] [address]  Basic metabolic panel     Status: Abnormal   Collection Time: 07/28/20  5:08 AM  Result Value Ref Range   Sodium 142 135 - 145 mmol/L   Potassium 4.0 3.5 - 5.1 mmol/L   Chloride 112 (H) 98 - 111 mmol/L   CO2 23 22 - 32 mmol/L   Glucose, Bld 96 70 - 99 mg/dL    Comment: Glucose reference range applies only to samples taken after fasting for at least 8 hours.   BUN 25 (H) 8 - 23 mg/dL   Creatinine, Ser 1.84 (H) 0.61 - 1.24 mg/dL   Calcium 8.9 8.9 - 10.3 mg/dL   GFR, Estimated 35 (L) >60 mL/min    Comment: (NOTE) Calculated using the CKD-EPI Creatinine Equation (2021)    Anion gap 7 5  - 15    Comment: Performed at [facility], [address], [place], [place] [address]    Recent Results (from the past 240 hour(s))  Respiratory Panel by RT PCR (Flu A&B, Covid) - Nasopharyngeal Swab     Status: None   Collection Time: 07/26/20  8:55 PM   Specimen: Nasopharyngeal Swab  Result Value Ref Range Status   SARS Coronavirus 2 by RT PCR NEGATIVE NEGATIVE Final    Comment: (NOTE) SARS-CoV-2 target nucleic acids are NOT DETECTED.  The SARS-CoV-2 RNA is generally detectable in upper respiratoy specimens during the acute phase of infection. The lowest concentration of SARS-CoV-2 viral copies this assay can detect is 131 copies/mL. A negative result does not preclude SARS-Cov-2 infection and should not be used as the sole basis for treatment or other patient management decisions. A negative result may occur with  improper specimen collection/handling, submission of specimen other than nasopharyngeal swab, presence of viral mutation(s) within the areas targeted by this assay, and inadequate number of viral copies (<131 copies/mL). A negative result must be combined with clinical observations, patient history, and epidemiological information. The expected result is Negative.  Fact Sheet for Patients:  PinkCheek.be  Fact Sheet for Healthcare Providers:  GravelBags.it  This test is no t yet approved or cleared by the [place] FDA and  has been authorized for detection and/or diagnosis of SARS-CoV-2 by FDA under an Emergency Use Authorization (EUA). This EUA will remain  in effect (meaning this test can be used) for the duration of the COVID-19 declaration under Section 564(b)(1) of the Act, 21 U.S.C. section 360bbb-3(b)(1), unless the authorization is terminated or revoked sooner.     Influenza A by PCR NEGATIVE NEGATIVE Final   Influenza B by PCR NEGATIVE NEGATIVE Final    Comment: (NOTE) The Xpert  Xpress SARS-CoV-2/FLU/RSV assay is intended as an aid in  the diagnosis of influenza from Nasopharyngeal swab specimens and  should not be used as a sole basis for treatment. Nasal washings and  aspirates are unacceptable for Xpert Xpress SARS-CoV-2/FLU/RSV  testing.  Fact Sheet for Patients: PinkCheek.be  Fact Sheet for Healthcare Providers: GravelBags.it  This test is not yet approved or cleared by the [place] FDA and  has been authorized for detection and/or diagnosis of SARS-CoV-2 by  FDA under an Emergency Use Authorization (EUA). This EUA will remain  in effect (meaning this test can be used) for the duration of the  Covid-19  declaration under Section 564(b)(1) of the Act, 21  U.S.C. section 360bbb-3(b)(1), unless the authorization is  terminated or revoked. Performed at [facility], [address] [place], [place] [address]     Lipid Panel Recent Labs    07/27/20 0341  CHOL 117  TRIG 79  HDL 47  CHOLHDL 2.5  VLDL 16  LDLCALC 54    Studies/Results: DG Chest 2 View  Result Date: 07/26/2020 CLINICAL DATA:  Dizziness EXAM: CHEST - 2 VIEW COMPARISON:  None. FINDINGS: The heart size and mediastinal contours are within normal limits. Both lungs are clear. The visualized skeletal structures are unremarkable. IMPRESSION: No active cardiopulmonary disease. Electronically Signed   By: Inez Catalina M.D.   On: 07/26/2020 21:37   CT HEAD WO CONTRAST  Result Date: 07/26/2020 CLINICAL DATA:  Dizziness. EXAM: CT HEAD WITHOUT CONTRAST TECHNIQUE: Contiguous axial images were obtained from the base of the skull through the vertex without intravenous contrast. COMPARISON:  September 23, 2015 FINDINGS: Brain: There is moderate severity cerebral atrophy with widening of the extra-axial spaces and ventricular dilatation. There are areas of decreased attenuation within the white matter tracts of the supratentorial  brain, consistent with microvascular disease changes. Vascular: No hyperdense vessel or unexpected calcification. Skull: Normal. Negative for fracture or focal lesion. Sinuses/Orbits: No acute finding. Other: None. IMPRESSION: 1. Generalized cerebral atrophy. 2. No acute intracranial abnormality. Electronically Signed   By: Virgina Norfolk M.D.   On: 07/26/2020 15:17   MR ANGIO HEAD WO CONTRAST  Result Date: 07/27/2020 CLINICAL DATA:  Stroke follow-up EXAM: MRA HEAD WITHOUT CONTRAST TECHNIQUE: Angiographic images of the Circle of Willis were obtained using MRA technique without intravenous contrast. COMPARISON:  None. FINDINGS: The carotid and vertebral arteries are widely patent with generalized mild atheromatous changes. No branch occlusion, beading, or aneurysm. High-grade right proximal M2 segment stenosis. Atheromatous irregularity of bilateral medium size vessels, especially PCA branches. Mild to moderate narrowing at the right PICA origin. IMPRESSION: 1. No emergent finding. 2. Intracranial atherosclerosis without significant proximal stenosis. 3. High-grade right M2 branch stenosis Electronically Signed   By: Monte Fantasia M.D.   On: 07/27/2020 14:30   MR BRAIN WO CONTRAST  Result Date: 07/26/2020 CLINICAL DATA:  Transient ischemic attack EXAM: MRI HEAD WITHOUT CONTRAST TECHNIQUE: Multiplanar, multiecho pulse sequences of the brain and surrounding structures were obtained without intravenous contrast. COMPARISON:  04/13/2017 FINDINGS: Brain: No acute infarct, acute hemorrhage or extra-axial collection. Diffuse confluent hyperintense T2-weighted signal within the periventricular, deep and juxtacortical white matter. Advanced generalized atrophy. There are multiple old small vessel infarcts within the supratentorial white matter. Numerous scattered chronic microhemorrhages are predominantly peripheral. Normal midline structures. Vascular: Normal flow voids. Skull and upper cervical spine: Normal  marrow signal. Sinuses/Orbits: Negative. Other: None. IMPRESSION: 1. No acute intracranial abnormality. 2. Advanced generalized atrophy and findings of chronic ischemic microangiopathy. 3. Numerous peripheral predominant chronic microhemorrhages, consistent with cerebral amyloid angiopathy. Electronically Signed   By: Ulyses Jarred M.D.   On: 07/26/2020 23:23   US Carotid Bilateral (at [facility] and [facility] only)  Result Date: 07/27/2020 CLINICAL DATA:  TIA, hypertension, syncope and hyperlipidemia. EXAM: BILATERAL CAROTID DUPLEX ULTRASOUND TECHNIQUE: Pearline Cables scale imaging, color Doppler and duplex ultrasound were performed of bilateral carotid and vertebral arteries in the neck. COMPARISON:  None. FINDINGS: Criteria: Quantification of carotid stenosis is based on velocity parameters that correlate the residual internal carotid diameter with NASCET-based stenosis levels, using the diameter of the distal internal carotid lumen as the denominator for  stenosis measurement. The following velocity measurements were obtained: RIGHT ICA:  69/26 cm/sec CCA:  010/93 cm/sec SYSTOLIC ICA/CCA RATIO:  0.7 ECA:  90 cm/sec LEFT ICA:  74/14 cm/sec CCA:  235/57 cm/sec SYSTOLIC ICA/CCA RATIO:  0.6 ECA:  80 cm/sec RIGHT CAROTID ARTERY: Minimal plaque at the level of the right carotid bulb and right ICA. Estimated right ICA stenosis is less than 50%. RIGHT VERTEBRAL ARTERY: Antegrade flow with normal waveform and velocity. LEFT CAROTID ARTERY: Mild amount of partially calcified plaque at the level of the left carotid bulb and proximal left ICA. Estimated left ICA stenosis is less than 50%. LEFT VERTEBRAL ARTERY: Antegrade flow with normal waveform and velocity. IMPRESSION: No significant carotid stenosis identified with estimated bilateral ICA stenoses of less than 50%. Minimal to mild plaque present bilaterally. Electronically Signed   By: Aletta Edouard M.D.   On: 07/27/2020 10:15    Medications:  Scheduled: . amLODipine  10 mg Oral  Daily  . aspirin EC  81 mg Oral Daily  . clopidogrel  75 mg Oral Daily  . enoxaparin (LOVENOX) injection  30 mg Subcutaneous Q24H  . lamoTRIgine  25 mg Oral QHS  . pantoprazole  40 mg Oral Daily  . ramelteon  8 mg Oral QHS  . rosuvastatin  20 mg Oral Daily   Continuous: . sodium chloride 75 mL/hr at 07/28/20 0100    TTE: 1. Left ventricular ejection fraction, by estimation, is 60 to 65%. The  left ventricle has normal function. The left ventricle has no regional  wall motion abnormalities. Left ventricular diastolic parameters were  normal.  2. Right ventricular systolic function is normal. The right ventricular  size is normal.  3. The mitral valve is normal in structure. Trivial mitral valve  regurgitation.  4. The aortic valve is normal in structure. Aortic valve regurgitation is  not visualized.    Assessment: 86 year old male presenting with symptoms of TIA. No acute infarct on MRI, but chronic changes most consistent with amyloid angiopathy and severe chronic small vessel disease are seen. 1. Exam reveals no localizable neurological deficit except for left hip flexor weakness. Weakness of right hand grip and deltoid are secondary to arthritis and rotator cuff injury, respectively.  2. MRI brain reveals diffuse confluent hyperintense T2-weighted signal within the periventricular, deep and juxtacortical white matter, in conjunction with advanced generalized atrophy. There are multiple old small vessel infarcts within the supratentorial white matter. Numerous scattered chronic microhemorrhages are predominantly peripheral. 3. Carotid ultrasound: No significant carotid stenosis identified with estimated bilateral ICA stenoses of less than 50%. Minimal to mild plaque present bilaterally. 4. MRA head reveals high-grade right M2 branch stenosis on a background of intracranial atherosclerosis. 5. Stroke risk factors:  Prior history of TIA, HLD and HTN  6. TTE shows no structural  findings to suggest a cardioembolic etiology for his stroke  Recommendations: 1. Continue Plavix and ASA. Benefits for secondary stroke prevention outweigh risk of hemorrhage in the setting of amyloid angiopathy.  2. Outpatient Neurology follow up for TIA as well as the left hip flexion weakness seen on exam (asymptomatic per patient), for which multiple etiologies are possible, including hip joint pathology, iliopsoas muscle dysfunction and radiculopathy.  3. Neurohospitalist service will sign off. Please call if there are additional questions.      LOS: 0 days   @Electronically  signed: Dr. Kerney Elbe 07/28/2020  7:59 AM

## 2020-07-28 NOTE — Progress Notes (Signed)
Patient is being discharged home.  Discharge papers given and explained to pt. Pt verbalized understanding.  Meds and f/u appointments reviewed. Rx sent electronically to the pharmacy.  Pt made aware.  Awaiting transportation.

## 2020-07-29 ENCOUNTER — Telehealth: Payer: Self-pay | Admitting: Family Medicine

## 2020-07-29 LAB — HEMOGLOBIN A1C
Hgb A1c MFr Bld: 5.7 % — ABNORMAL HIGH (ref 4.8–5.6)
Mean Plasma Glucose: 117 mg/dL

## 2020-07-29 NOTE — Telephone Encounter (Signed)
Pt called scheduled hospital follow up 11/1.  He stated he is having problems sleeping and wanted to know if dr Damita Dunnings could call him something in .  He stated the hospital gave him something he stated this didn't help.  [facility] [address]

## 2020-07-30 NOTE — Telephone Encounter (Signed)
I would try taking over-the-counter Unisom first.  Please see how that does and we can talk about it when he comes in.  Thanks.

## 2020-07-30 NOTE — Telephone Encounter (Signed)
Daughter advised, DPR.  Patient was sleeping.

## 2020-07-31 ENCOUNTER — Emergency Department
Admission: EM | Admit: 2020-07-31 | Discharge: 2020-07-31 | Disposition: A | Discharge status: Home / Self Care | Payer: Medicare Other | Attending: Emergency Medicine | Admitting: Emergency Medicine

## 2020-07-31 ENCOUNTER — Emergency Department: Payer: Medicare Other

## 2020-07-31 ENCOUNTER — Other Ambulatory Visit (HOSPITAL_COMMUNITY): Payer: Self-pay

## 2020-07-31 ENCOUNTER — Encounter: Payer: Self-pay | Admitting: Emergency Medicine

## 2020-07-31 ENCOUNTER — Other Ambulatory Visit: Payer: Self-pay

## 2020-07-31 ENCOUNTER — Emergency Department (HOSPITAL_COMMUNITY): Payer: Medicare Other

## 2020-07-31 DIAGNOSIS — Z7982 Long term (current) use of aspirin: Secondary | ICD-10-CM | POA: Diagnosis not present

## 2020-07-31 DIAGNOSIS — I129 Hypertensive chronic kidney disease with stage 1 through stage 4 chronic kidney disease, or unspecified chronic kidney disease: Secondary | ICD-10-CM | POA: Insufficient documentation

## 2020-07-31 DIAGNOSIS — R569 Unspecified convulsions: Secondary | ICD-10-CM

## 2020-07-31 DIAGNOSIS — I10 Essential (primary) hypertension: Secondary | ICD-10-CM | POA: Diagnosis not present

## 2020-07-31 DIAGNOSIS — R531 Weakness: Secondary | ICD-10-CM | POA: Diagnosis not present

## 2020-07-31 DIAGNOSIS — Z7901 Long term (current) use of anticoagulants: Secondary | ICD-10-CM | POA: Diagnosis not present

## 2020-07-31 DIAGNOSIS — R4781 Slurred speech: Secondary | ICD-10-CM | POA: Diagnosis not present

## 2020-07-31 DIAGNOSIS — R471 Dysarthria and anarthria: Secondary | ICD-10-CM | POA: Insufficient documentation

## 2020-07-31 DIAGNOSIS — R410 Disorientation, unspecified: Secondary | ICD-10-CM | POA: Diagnosis not present

## 2020-07-31 DIAGNOSIS — G459 Transient cerebral ischemic attack, unspecified: Secondary | ICD-10-CM

## 2020-07-31 DIAGNOSIS — G319 Degenerative disease of nervous system, unspecified: Secondary | ICD-10-CM | POA: Diagnosis not present

## 2020-07-31 DIAGNOSIS — N1832 Chronic kidney disease, stage 3b: Secondary | ICD-10-CM | POA: Diagnosis not present

## 2020-07-31 DIAGNOSIS — I480 Paroxysmal atrial fibrillation: Secondary | ICD-10-CM

## 2020-07-31 DIAGNOSIS — Z87891 Personal history of nicotine dependence: Secondary | ICD-10-CM | POA: Diagnosis not present

## 2020-07-31 DIAGNOSIS — Z79899 Other long term (current) drug therapy: Secondary | ICD-10-CM | POA: Diagnosis not present

## 2020-07-31 DIAGNOSIS — H748X2 Other specified disorders of left middle ear and mastoid: Secondary | ICD-10-CM | POA: Diagnosis not present

## 2020-07-31 LAB — COMPREHENSIVE METABOLIC PANEL
ALT: 18 U/L (ref 0–44)
AST: 23 U/L (ref 15–41)
Albumin: 4.5 g/dL (ref 3.5–5.0)
Alkaline Phosphatase: 82 U/L (ref 38–126)
Anion gap: 9 (ref 5–15)
BUN: 27 mg/dL — ABNORMAL HIGH (ref 8–23)
CO2: 25 mmol/L (ref 22–32)
Calcium: 9.5 mg/dL (ref 8.9–10.3)
Chloride: 106 mmol/L (ref 98–111)
Creatinine, Ser: 2.04 mg/dL — ABNORMAL HIGH (ref 0.61–1.24)
GFR, Estimated: 31 mL/min — ABNORMAL LOW (ref >60)
Glucose, Bld: 116 mg/dL — ABNORMAL HIGH (ref 70–99)
Potassium: 4.9 mmol/L (ref 3.5–5.1)
Sodium: 140 mmol/L (ref 135–145)
Total Bilirubin: 0.8 mg/dL (ref 0.3–1.2)
Total Protein: 9.1 g/dL — ABNORMAL HIGH (ref 6.5–8.1)

## 2020-07-31 LAB — CBC
HCT: 40.4 % (ref 39.0–52.0)
Hemoglobin: 13.3 g/dL (ref 13.0–17.0)
MCH: 29.8 pg (ref 26.0–34.0)
MCHC: 32.9 g/dL (ref 30.0–36.0)
MCV: 90.6 fL (ref 80.0–100.0)
Platelets: 175 10*3/uL (ref 150–400)
RBC: 4.46 MIL/uL (ref 4.22–5.81)
RDW: 13.2 % (ref 11.5–15.5)
WBC: 6.6 10*3/uL (ref 4.0–10.5)
nRBC: 0 % (ref 0.0–0.2)

## 2020-07-31 LAB — DIFFERENTIAL
Abs Immature Granulocytes: 0.01 10*3/uL (ref 0.00–0.07)
Basophils Absolute: 0.1 10*3/uL (ref 0.0–0.1)
Basophils Relative: 1 %
Eosinophils Absolute: 0.2 10*3/uL (ref 0.0–0.5)
Eosinophils Relative: 3 %
Immature Granulocytes: 0 %
Lymphocytes Relative: 19 %
Lymphs Abs: 1.3 10*3/uL (ref 0.7–4.0)
Monocytes Absolute: 0.4 10*3/uL (ref 0.1–1.0)
Monocytes Relative: 6 %
Neutro Abs: 4.7 10*3/uL (ref 1.7–7.7)
Neutrophils Relative %: 71 %

## 2020-07-31 LAB — PROTIME-INR
INR: 0.9 (ref 0.8–1.2)
Prothrombin Time: 12.1 seconds (ref 11.4–15.2)

## 2020-07-31 LAB — CBG MONITORING, ED: Glucose-Capillary: 103 mg/dL — ABNORMAL HIGH (ref 70–99)

## 2020-07-31 LAB — APTT: aPTT: 29 seconds (ref 24–36)

## 2020-07-31 MED ORDER — LEVETIRACETAM IN NACL 1000 MG/100ML IV SOLN
1000.0000 mg | Freq: Once | INTRAVENOUS | Status: AC
  Administered 2020-07-31: 1000 mg via INTRAVENOUS
  Filled 2020-07-31: qty 100

## 2020-07-31 MED ORDER — MELATONIN 3 MG PO TABS: 3.0000 mg | ORAL_TABLET | Freq: Every day | ORAL | 1 refills | Status: AC

## 2020-07-31 MED ORDER — LEVETIRACETAM 500 MG PO TABS: 500.0000 mg | ORAL_TABLET | Freq: Two times a day (BID) | ORAL | 1 refills | Status: DC

## 2020-07-31 MED ORDER — SODIUM CHLORIDE 0.9% FLUSH
3.0000 mL | Freq: Once | INTRAVENOUS | Status: AC
Start: 2020-07-31 — End: 2020-07-31
  Administered 2020-07-31: 3 mL via INTRAVENOUS

## 2020-07-31 NOTE — ED Notes (Signed)
Pt presents to ED from [facility] clinic with daughter with c/o of having slurred speech and weakness while waitng at the Dr office to speak to neurologist about f/up about TIA that happened this past Friday PTA.  Daughter states episode on Friday warranted a admission and lasted about 20-30 minutes and has similar sympotms as today. When this RN assessed pt he was  A&Ox4 and had no deficits noted. Daughter states he was back at baseline. Symptoms today were also slurred speech, facial droop (daughter unsure of which sided droop was on). Facial droop was resolved prior to this RN assessing pt. Pt has strong bilateral grips and dorsiflexion. Pt has no pronator drift noted. Pt is A&Ox4. VSS. NAD noted.

## 2020-07-31 NOTE — ED Notes (Signed)
Pt at MRI

## 2020-07-31 NOTE — ED Provider Notes (Signed)
[facility] Emergency Department Provider Note  ____________________________________________  Time seen: Approximately 11:16 AM  I have reviewed the triage vital signs and the nursing notes.   HISTORY  Chief Complaint Code Stroke    HPI Justin Ford is a 86 y.o. male with a history of hypertension hyperlipidemia and prior TIA (most recently 5 days ago) who was in his usual state of health, in neurology clinic for outpatient follow-up today when he had a sudden onset of dizziness confusion and slurred speech that seem to happen at about 10:20 AM today.  He was brought over to the ED for evaluation, sent to CT for immediate CT scan of the head.  Daughter at bedside notes that he seemed more confused than usual this morning before they went to neurology clinic and patient agrees.  Symptoms this morning were constant without aggravating or alleviating factors, denies any acute headache vision change or pain, no lateralizing weakness or paresthesia.  No recent trauma.  Symptoms lasted about 20 minutes and then seemed to resolved and patient feels back to normal now.  Daughter agrees that his speech has returned to normal.     Past Medical History:  Diagnosis Date  . Barrett's esophagus   . Blood in stool    colonoscopy done ~2010  . Cancer ([place])    basal cell CA per derm  . GERD (gastroesophageal reflux disease)   . Hyperlipidemia   . Hypertension   . Kidney stones   . TIA (transient ischemic attack)    2016     Patient Active Problem List   Diagnosis Date Noted  . Gait abnormality 05/15/2020  . Angiodysplasia of intestinal tract   . Polyp of colon   . Melena   . Angiodysplasia of stomach   . Right foot drop 02/28/2017  . Left leg swelling 10/30/2016  . Gout 10/11/2016  . B12 deficiency 05/21/2016  . Bruising 05/21/2016  . Tremor 03/08/2016  . Hamstring strain 11/05/2015  . Multiple falls 11/05/2015  . CVA (cerebral infarction) 09/23/2015   . Lower back pain 09/12/2015  . Shoulder pain 09/12/2015  . 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 TIA (transient ischemic attack) 06/21/2015  . Hyperglycemia 08/09/2013  . Greater trochanteric bursitis 08/09/2013  . Trigger finger 06/21/2012  . Advance directive on file 03/22/2012  . Medicare annual wellness visit, subsequent 03/15/2012  . Dupuytren contracture 03/15/2012  . Cough 12/11/2011  . Barrett esophagus 10/12/2011  . Chronic kidney disease, stage III (moderate) ([place]) 10/12/2011  . HTN (hypertension) 09/27/2011  . HLD (hyperlipidemia) 09/27/2011  . GERD (gastroesophageal reflux disease) 09/27/2011     Past Surgical History:  Procedure Laterality Date  . COLONOSCOPY WITH PROPOFOL N/A 04/18/2020   Procedure: COLONOSCOPY WITH PROPOFOL;  Surgeon: Virgel Manifold, MD;  Location: [facility] ENDOSCOPY;  Service: Endoscopy;  Laterality: N/A;  . ESOPHAGOGASTRODUODENOSCOPY (EGD) WITH PROPOFOL N/A 03/07/2020   Procedure: ESOPHAGOGASTRODUODENOSCOPY (EGD) WITH PROPOFOL;  Surgeon: Virgel Manifold, MD;  Location: [facility] ENDOSCOPY;  Service: Endoscopy;  Laterality: N/A;  . PROSTATE BIOPSY     neg x2 per patient  . SHOULDER SURGERY     per Dr. Theda Sers, R shoulder     Prior to Admission medications   Medication Sig Start Date End Date Taking? Authorizing Provider  amLODipine (NORVASC) 10 MG tablet Take 1 tablet (10 mg total) by mouth daily. 07/29/20 08/28/20  Wyvonnia Dusky, MD  aspirin EC 81 MG tablet Take 81 mg by mouth daily. Swallow whole.  [provider]  clopidogrel (PLAVIX) 75 MG tablet Take 1 tablet (75 mg total) by mouth daily. 04/25/19   [name], MD  cyanocobalamin (,VITAMIN B-12,) 1000 MCG/ML injection 1020mcg IM weekly x4 doses then monthly thereafter 05/23/20   [name], MD  pantoprazole (PROTONIX) 40 MG tablet Take 1 tablet (40 mg total) by mouth daily. 06/07/20   [name], MD  rosuvastatin (CRESTOR) 20 MG tablet Take 1  tablet by mouth once daily 05/30/20   [name], MD     Allergies Lipitor [atorvastatin] and Lisinopril   Family History  Problem Relation Age of Onset  . Dementia Mother   . Alcohol abuse Father   . Heart disease Brother   . Cancer Sister        possible breast cancer, pt was unsure  . Colon cancer Neg Hx   . Prostate cancer Neg Hx     Social History Social History   Tobacco Use  . Smoking status: Former Smoker    Packs/day: 0.25    Years: 4.00    Pack years: 1.00    Quit date: 10/05/1950    Years since quitting: 69.8  . Smokeless tobacco: Never Used  Substance Use Topics  . Alcohol use: Not Currently    Alcohol/week: 2.0 standard drinks    Types: 2 Cans of beer per week  . Drug use: No    Review of Systems  Constitutional:   No fever or chills.  ENT:   No sore throat. No rhinorrhea. Cardiovascular:   No chest pain or syncope. Respiratory:   No dyspnea or cough. Gastrointestinal:   Negative for abdominal pain, vomiting and diarrhea.  Musculoskeletal:   Negative for focal pain or swelling All other systems reviewed and are negative except as documented above in ROS and HPI.  ____________________________________________   PHYSICAL EXAM:  VITAL SIGNS: ED Triage Vitals  Enc Vitals Group     BP 07/31/20 1038 (!) 158/79     Pulse Rate 07/31/20 1038 81     Resp 07/31/20 1038 16     Temp 07/31/20 1038 97.8 F (36.6 C)     Temp Source 07/31/20 1038 Oral     SpO2 07/31/20 1038 100 %     Weight 07/31/20 1054 129 lb 10.1 oz (58.8 kg)     Height 07/31/20 1039 5\' 8"  (1.727 m)     Head Circumference --      Peak Flow --      Pain Score 07/31/20 1040 0     Pain Loc --      Pain Edu? --      Excl. in [place]? --     Vital signs reviewed, nursing assessments reviewed.   Constitutional:   Alert and oriented. Non-toxic appearance. Eyes:   Conjunctivae are normal. EOMI. PERRL. ENT      Head:   Normocephalic and atraumatic.      Nose:   Wearing a mask.       Mouth/Throat:   Wearing a mask.      Neck:   No meningismus. Full ROM. Hematological/Lymphatic/Immunilogical:   No cervical lymphadenopathy. Cardiovascular:   RRR. Symmetric bilateral radial and DP pulses.  No murmurs. Cap refill less than 2 seconds. Respiratory:   Normal respiratory effort without tachypnea/retractions. Breath sounds are clear and equal bilaterally. No wheezes/rales/rhonchi. Gastrointestinal:   Soft and nontender. Non distended. There is no CVA tenderness.  No rebound, rigidity, or guarding. Genitourinary:   deferred Musculoskeletal:  Normal range of motion in all extremities. No joint effusions.  No lower extremity tenderness.  No edema. Neurologic:   Normal speech and language.  Motor grossly intact. Sensation symmetric Cerebellar function normal NIH stroke scale 0 No acute focal neurologic deficits are appreciated.  Skin:    Skin is warm, dry and intact. No rash noted.  No petechiae, purpura, or bullae.  ____________________________________________    LABS (pertinent positives/negatives) (all labs ordered are listed, but only abnormal results are displayed) Labs Reviewed  COMPREHENSIVE METABOLIC PANEL - Abnormal; Notable for the following components:      Result Value   Glucose, Bld 116 (*)    BUN 27 (*)    Creatinine, Ser 2.04 (*)    Total Protein 9.1 (*)    GFR, Estimated 31 (*)    All other components within normal limits  CBG MONITORING, ED - Abnormal; Notable for the following components:   Glucose-Capillary 103 (*)    All other components within normal limits  PROTIME-INR  APTT  CBC  DIFFERENTIAL  URINALYSIS, COMPLETE (UACMP) WITH MICROSCOPIC  I-STAT CREATININE, ED   ____________________________________________   EKG  Interpreted by me Sinus rhythm rate of 76, left axis, right bundle branch block, no acute ischemic changes.  ____________________________________________    RADIOLOGY  MR BRAIN WO CONTRAST  Result Date:  07/31/2020 CLINICAL DATA:  Slurred speech and weakness. EXAM: MRI HEAD WITHOUT CONTRAST TECHNIQUE: Multiplanar, multiecho pulse sequences of the brain and surrounding structures were obtained without intravenous contrast. COMPARISON:  CT head code stroke from the same day. MRI from 07/26/2020. FINDINGS: Brain: No acute infarction, hemorrhage, hydrocephalus, extra-axial collection or mass lesion. Similar confluent T2/FLAIR signal hyperintensity within the white matter. Similar advanced generalized cerebral atrophy with ex vacuo ventricular dilation. Similar appearance of numerous peripheral predominant microhemorrhages. Vascular: Major proximal arterial flow voids are maintained at the skull base. Skull and upper cervical spine: Normal marrow signal. Sinuses/Orbits: Opacification of scattered ethmoid air cells. Mild right frontal and sphenoid sinus mucosal thickening. Unremarkable orbits. Other: Small left mastoid effusion. IMPRESSION: 1. No acute abnormality.  Specifically, no acute infarct. 2. Similar advanced sternalis atrophy and chronic microvascular ischemic disease. 3. Similar numerous peripheral predominant chronic microhemorrhages, compatible with cerebral amyloid angiopathy. Electronically Signed   By: Margaretha Sheffield MD   On: 07/31/2020 14:13   CT HEAD CODE STROKE WO CONTRAST  Result Date: 07/31/2020 CLINICAL DATA:  Code stroke. Sudden onset confusion and slurred speech EXAM: CT HEAD WITHOUT CONTRAST TECHNIQUE: Contiguous axial images were obtained from the base of the skull through the vertex without intravenous contrast. COMPARISON:  Which brain MRI from 5 days ago FINDINGS: Brain: No evidence of acute infarction, hemorrhage, hydrocephalus, extra-axial collection or mass lesion/mass effect. Advanced chronic small vessel disease with confluent white matter low-density. Mild for age cerebral volume loss. Vascular: No hyperdense vessel or unexpected calcification. Skull: Normal. Negative for  fracture or focal lesion. Sinuses/Orbits: No acute finding. These results were called by telephone at the time of interpretation on 07/31/2020 at 10:57 am to provider Marcedes Tech , who verbally acknowledged these results. ASPECTS [facility] Stroke Program Early CT Score) - Ganglionic level infarction (caudate, lentiform nuclei, internal capsule, insula, M1-M3 cortex): 7 - Supraganglionic infarction (M4-M6 cortex): 3 Total score (0-10 with 10 being normal): 10 IMPRESSION: 1. No acute finding. 2. Advanced chronic small vessel disease. Electronically Signed   By: Monte Fantasia M.D.   On: 07/31/2020 10:57    ____________________________________________   PROCEDURES Procedures  ____________________________________________  DIFFERENTIAL  DIAGNOSIS   Transient encephalopathy, TIA, stroke, delirium  CLINICAL IMPRESSION / ASSESSMENT AND PLAN / ED COURSE  Medications ordered in the ED: Medications  sodium chloride flush (NS) 0.9 % injection 3 mL (3 mLs Intravenous Given 07/31/20 1104)    Pertinent labs & imaging results that were available during my care of the patient were reviewed by me and considered in my medical decision making (see chart for details).  Justin Ford was evaluated in Emergency Department on 07/31/2020 for the symptoms described in the history of present illness. He was evaluated in the context of the global COVID-19 pandemic, which necessitated consideration that the patient might be at risk for infection with the SARS-CoV-2 virus that causes COVID-19. Institutional protocols and algorithms that pertain to the evaluation of patients at risk for COVID-19 are in a state of rapid change based on information released by regulatory bodies including the CDC and federal and state organizations. These policies and algorithms were followed during the patient's care in the ED.     Clinical Course as of Jul 31 1437  Wed Jul 31, 2020  1110 Patient seen on arrival to the ED  treatment room after CT scan.  Reports that his symptoms appear to have resolved, lasting a total of about 20 minutes.  CT images reviewed by me, no apparent intracranial hemorrhage or large infarct.  Study discussed with radiologist Dr. Pascal Lux and report reviewed as well which agrees.  Currently NIH stroke scale is 0.  Patient was recently in the hospital for TIA work-up 5 days ago during which time he had MRA done which showed high-grade right M2 branch stenosis and amyloid angiopathy.  Carotid duplex and TTE were unremarkable.  He is on aspirin and Plavix.  We will consult neurology for recommendations, unclear if hospitalization again would be beneficial to the patient.   [PS]  1436 Labs unremarkable. Baseline CKD  Pt is in MRI. Pt will be signed out to oncoming physician to follow up MRI results and recommendations from Neuro Dr. Donnetta Simpers.     [PS]    Clinical Course User Index [PS] Carrie Mew, MD     ____________________________________________   FINAL CLINICAL IMPRESSION(S) / ED DIAGNOSES    Final diagnoses:  Dysarthria  Stage 3b chronic kidney disease [facility])     ED Discharge Orders    None      Portions of this note were generated with dragon dictation software. Dictation errors may occur despite best attempts at proofreading.   Carrie Mew, MD 07/31/20 1438

## 2020-07-31 NOTE — Procedures (Signed)
Patient Name: Justin Ford  MRN: [id number]  Epilepsy Attending: Lora Havens  Referring Physician/Provider: Dr Donnetta Simpers Date: 07/31/2020 Duration: 20.23 mins  Patient history: 86 year old male with an episode of word salad, right facial droop and slurred speech.  EEG to evaluate for seizure.  Level of alertness: Awake  AEDs during EEG study: None  Technical aspects: This EEG study was done with scalp electrodes positioned according to the 10-20 International system of electrode placement. Electrical activity was acquired at a sampling rate of 500Hz  and reviewed with a high frequency filter of 70Hz  and a low frequency filter of 1Hz . EEG data were recorded continuously and digitally stored.   Description: The posterior dominant rhythm consists of 8.5 Hz activity of moderate voltage (25-35 uV) seen predominantly in posterior head regions, symmetric and reactive to eye opening and eye closing.  EEG showed intermittent left more than right temporal region 3 to 5 Hz theta-delta slowing.  Physiologic photic driving was not seen during photic stimulation.  Hyperventilation was not performed.     ABNORMALITY -Intermittent slow,  left more than right temporal region  IMPRESSION: This study is suggestive of nonspecific cortical dysfunction in left more than right temporal region.  No seizures or epileptiform discharges were seen throughout the recording.  Karenann Mcgrory Barbra Sarks

## 2020-07-31 NOTE — Progress Notes (Signed)
eeg completed ° °

## 2020-07-31 NOTE — ED Notes (Signed)
D/C and new medications discussed with pt, pt and daughter verbalized understanding.

## 2020-07-31 NOTE — ED Notes (Signed)
Pt resting comfortably at this time. No change in pt condition at this time. No distress noted. Daughter at bedside
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
## 2020-07-31 NOTE — Discharge Instructions (Addendum)
Seizure precautions: °Per [place] DMV statutes, patients with seizures are not allowed to drive until they have been seizure-free for six months and cleared by a physician  °  °Use caution when using heavy equipment or power tools. Avoid working on ladders or at heights. Take showers instead of baths. Ensure the water temperature is not too high on the home water heater. Do not go swimming alone. Do not lock yourself in a room alone (i.e. bathroom). When caring for infants or small children, sit down when holding, feeding, or changing them to minimize risk of injury to the child in the event you have a seizure. Maintain good sleep hygiene. Avoid alcohol.  °  °If patient has another seizure, call 911 and bring them back to the ED if: °A.  The seizure lasts longer than 5 minutes.      °B.  The patient doesn't wake shortly after the seizure or has new problems such as difficulty seeing, speaking or moving following the seizure °C.  The patient was injured during the seizure °D.  The patient has a temperature over 102 F (39C) °E.  The patient vomited during the seizure and now is having trouble breathing °   °During the Seizure °  °- First, ensure adequate ventilation and place patients on the floor on their left side  °Loosen clothing around the neck and ensure the airway is patent. If the patient is clenching the teeth, do not force the mouth open with any object as this can cause severe damage °- Remove all items from the surrounding that can be hazardous. The patient may be oblivious to what's happening and may not even know what he or she is doing. °If the patient is confused and wandering, either gently guide him/her away and block access to outside areas °- Reassure the individual and be comforting °- Call 911. In most cases, the seizure ends before EMS arrives. However, there are cases when seizures may last over 3 to 5 minutes. Or the individual may have developed breathing difficulties or severe  injuries. If a pregnant patient or a person with diabetes develops a seizure, it is prudent to call an ambulance. °- Finally, if the patient does not regain full consciousness, then call EMS. Most patients will remain confused for about 45 to 90 minutes after a seizure, so you must use judgment in calling for help. °- Avoid restraints but make sure the patient is in a bed with padded side rails °- Place the individual in a lateral position with the neck slightly flexed; this will help the saliva drain from the mouth and prevent the tongue from falling backward °- Remove all nearby furniture and other hazards from the area °- Provide verbal assurance as the individual is regaining consciousness °- Provide the patient with privacy if possible °- Call for help and start treatment as ordered by the caregiver °  ° After the Seizure (Postictal Stage) °  °After a seizure, most patients experience confusion, fatigue, muscle pain and/or a headache. Thus, one should permit the individual to sleep. For the next few days, reassurance is essential. Being calm and helping reorient the person is also of importance. °  °Most seizures are painless and end spontaneously. Seizures are not harmful to others but can lead to complications such as stress on the lungs, brain and the heart. Individuals with prior lung problems may develop labored breathing and respiratory distress.  °  °

## 2020-07-31 NOTE — ED Triage Notes (Signed)
Pt brought over from [facility] with c/o sudden onset confusion with slurred speech at 1023am today, was there for a follow up after having a TIA on friday

## 2020-07-31 NOTE — ED Notes (Signed)
Pt passed [place] /swallow screening, MD states pt can eat at this time.

## 2020-07-31 NOTE — ED Provider Notes (Signed)
Patient has been evaluated bedside by neurology.  They are recommending load with IV Keppra 1 g +500 twice daily as well as prescription for 3 mg melatonin to help with sleep.  Otherwise stable and appropriate for close outpatient follow-up with neurology.  Patient reassessed and is well-appearing at this time.  Family agreeable to plan.   Merlyn Lot, MD 07/31/20 1800

## 2020-08-05 ENCOUNTER — Ambulatory Visit (INDEPENDENT_AMBULATORY_CARE_PROVIDER_SITE_OTHER): Payer: Medicare Other | Admitting: Family Medicine

## 2020-08-05 ENCOUNTER — Encounter: Payer: Self-pay | Admitting: Family Medicine

## 2020-08-05 ENCOUNTER — Other Ambulatory Visit: Payer: Self-pay

## 2020-08-05 VITALS — BP 110/72 | HR 79 | Temp 96.2°F | Wt 168.0 lb

## 2020-08-05 DIAGNOSIS — E538 Deficiency of other specified B group vitamins: Secondary | ICD-10-CM | POA: Diagnosis not present

## 2020-08-05 DIAGNOSIS — R471 Dysarthria and anarthria: Secondary | ICD-10-CM

## 2020-08-05 DIAGNOSIS — G4700 Insomnia, unspecified: Secondary | ICD-10-CM

## 2020-08-05 DIAGNOSIS — N183 Chronic kidney disease, stage 3 unspecified: Secondary | ICD-10-CM | POA: Diagnosis not present

## 2020-08-05 DIAGNOSIS — I10 Essential (primary) hypertension: Secondary | ICD-10-CM

## 2020-08-05 MED ORDER — AMLODIPINE BESYLATE 5 MG PO TABS
5.0000 mg | ORAL_TABLET | Freq: Every day | ORAL | 1 refills | Status: DC
Start: 2020-08-05 — End: 2021-03-04

## 2020-08-05 NOTE — Patient Instructions (Addendum)
I would try cutting amlodipine back to 5mg  a day.   I would keep the appointment with Dr. Manuella Ghazi tomorrow.   I would stay off plavix until I can talk with Dr. Manuella Ghazi.  We should recheck your labs on the 16th.  I'll ask for B12 teaching/review at that point, for home injection.   Take care.  Glad to see you.  Don't take ibuprofen or aleve.

## 2020-08-05 NOTE — Progress Notes (Signed)
This visit occurred during the SARS-CoV-2 public health emergency.  Safety protocols were in place, including screening questions prior to the visit, additional usage of staff PPE, and extensive cleaning of exam room while observing appropriate contact time as indicated for disinfecting solutions.  He was speaking abnormally.  Was seen at ER.  07/31/20.  EEG done.  This study is suggestive of nonspecific cortical dysfunction in left more than right temporal region.  No seizures or epileptiform discharges were seen throughout the recording.  Recent ER course discussed with patient and family here at office visit.  Patient has not been taking Plavix per MD at hospital told to discuss with PCP.  Family has noted that he is slower in speaking and talking since starting the [place]. Has f/u with Dr. Manuella Ghazi pending.  He still has a faint L thumb tremor but no staring spells in the meantime.   Patient/family wanted to know if he should continue Amlodipine- BP is reasonable and he isn't lightheaded so I would continue but cut back to 5mg .  See below.   Vitamin B12 Deficiency.  Is on monthly injection.  We discussed options.  Family can come in for next visit to get teaching and then go B12 injections at home.    Insomnia.  problems falling and staying asleep... tried OTC Unisom x 3-4 nights with no relief.  We talked about table in intervention on this given the other issues discussed.  Family had noticed that his movements overall had slowed down with the addition of amlodipine prior to ER evaluation.  He can check his BP at home.   CKD d/w pt.  We can recheck on next set of labs.   Meds, vitals, and allergies reviewed.   ROS: Per HPI unless specifically indicated in ROS section   GEN: nad, alert and oriented HEENT: ncat NECK: supple w/o LA CV: rrr. PULM: ctab, no inc wob ABD: soft, +bs EXT: no edema SKIN: no acute rash S/S wnl.  Speech fluent and not slurred.

## 2020-08-06 DIAGNOSIS — M21371 Foot drop, right foot: Secondary | ICD-10-CM | POA: Diagnosis not present

## 2020-08-06 DIAGNOSIS — E538 Deficiency of other specified B group vitamins: Secondary | ICD-10-CM | POA: Diagnosis not present

## 2020-08-06 DIAGNOSIS — R569 Unspecified convulsions: Secondary | ICD-10-CM | POA: Diagnosis not present

## 2020-08-06 DIAGNOSIS — Z8673 Personal history of transient ischemic attack (TIA), and cerebral infarction without residual deficits: Secondary | ICD-10-CM | POA: Diagnosis not present

## 2020-08-06 DIAGNOSIS — G609 Hereditary and idiopathic neuropathy, unspecified: Secondary | ICD-10-CM | POA: Diagnosis not present

## 2020-08-07 ENCOUNTER — Telehealth: Payer: Self-pay | Admitting: Family Medicine

## 2020-08-07 DIAGNOSIS — G4700 Insomnia, unspecified: Secondary | ICD-10-CM | POA: Insufficient documentation

## 2020-08-07 DIAGNOSIS — R471 Dysarthria and anarthria: Secondary | ICD-10-CM | POA: Insufficient documentation

## 2020-08-07 MED ORDER — CYANOCOBALAMIN 1000 MCG/ML IJ SOLN: INTRAMUSCULAR | Status: DC

## 2020-08-07 NOTE — Assessment & Plan Note (Signed)
EEG done.  This study is suggestive of nonspecific cortical dysfunction in left more than right temporal region.  No seizures or epileptiform discharges were seen throughout the recording.  I think it makes sense to hold his Plavix in the meantime until I can talk with Dr. Manuella Ghazi.  I would continue Keppra for now.  He has neurology follow-up pending in the meantime.  He is not driving, with routine seizure cautions given to patient.

## 2020-08-07 NOTE — Assessment & Plan Note (Signed)
Insomnia.  problems falling and staying asleep... tried OTC Unisom x 3-4 nights with no relief.  We talked about table in intervention on this given the other issues discussed.

## 2020-08-07 NOTE — Assessment & Plan Note (Signed)
Family had noticed that his movements overall had slowed down with the addition of amlodipine prior to ER evaluation.  He can check his BP at home.  Given that, I think it makes sense to decrease his amlodipine down to 5 mg a day.  Family can update me as needed.

## 2020-08-07 NOTE — Assessment & Plan Note (Signed)
Vitamin B12 Deficiency.  Is on monthly injection.  We discussed options.  Family can come in for next visit to get teaching and then go B12 injections at home.

## 2020-08-07 NOTE — Telephone Encounter (Signed)
I need office visit note sent to Dr. Manuella Ghazi.  I need to talk with Dr. Manuella Ghazi about Plavix use in this patient.  Please give him my phone number/get his opinion on continued Plavix use since patient has numerous peripheral predominant chronic microhemorrhages, compatible with cerebral amyloid angiopathy. Thanks.

## 2020-08-07 NOTE — Assessment & Plan Note (Signed)
We can recheck creatinine with next set of labs.  Ordered.

## 2020-08-08 ENCOUNTER — Telehealth: Payer: Self-pay

## 2020-08-08 NOTE — Telephone Encounter (Signed)
-----   Message from [name], MD sent at 08/07/2020 10:20 PM EDT ----- Patient is coming in on the 16th.  I need that to be a B12 visit where family gets coached up on injection so he can get it at home.  Patient is no longer driving.  His daughter used to work at a vet clinic and has done injections on animals previously.  She should be able to get this done.  Also need him to get a lab visit that same day.  I put in follow-up bmet order.  Can always happen on the same day?  I really appreciate the help.  He needs a lot of extra help.

## 2020-08-08 NOTE — Telephone Encounter (Signed)
Made notes in scheduling notes.

## 2020-08-08 NOTE — Telephone Encounter (Signed)
Message was sent to Dr. Manuella Ghazi with the providers number and last visit note.  Jamauri Kruzel,cma

## 2020-08-13 ENCOUNTER — Telehealth: Payer: Self-pay | Admitting: Family Medicine

## 2020-08-13 DIAGNOSIS — M19042 Primary osteoarthritis, left hand: Secondary | ICD-10-CM | POA: Diagnosis not present

## 2020-08-13 DIAGNOSIS — M7989 Other specified soft tissue disorders: Secondary | ICD-10-CM | POA: Diagnosis not present

## 2020-08-13 DIAGNOSIS — M19041 Primary osteoarthritis, right hand: Secondary | ICD-10-CM | POA: Diagnosis not present

## 2020-08-13 DIAGNOSIS — N183 Chronic kidney disease, stage 3 unspecified: Secondary | ICD-10-CM | POA: Diagnosis not present

## 2020-08-13 DIAGNOSIS — M1A0710 Idiopathic chronic gout, right ankle and foot, without tophus (tophi): Secondary | ICD-10-CM | POA: Diagnosis not present

## 2020-08-13 DIAGNOSIS — M2550 Pain in unspecified joint: Secondary | ICD-10-CM | POA: Diagnosis not present

## 2020-08-13 DIAGNOSIS — M24541 Contracture, right hand: Secondary | ICD-10-CM | POA: Diagnosis not present

## 2020-08-13 NOTE — Telephone Encounter (Signed)
Daughter called to r/s 11/16 lab and nurse visit appointment. It looks like nurse visit was for education with rn  What day would be good to r/s to

## 2020-08-13 NOTE — Telephone Encounter (Signed)
Anytime that the nurse visit schedule allows would be okay.

## 2020-08-20 ENCOUNTER — Ambulatory Visit: Payer: Medicare Other

## 2020-08-20 ENCOUNTER — Other Ambulatory Visit: Payer: Medicare Other

## 2020-08-28 ENCOUNTER — Ambulatory Visit (INDEPENDENT_AMBULATORY_CARE_PROVIDER_SITE_OTHER): Payer: Medicare Other

## 2020-08-28 ENCOUNTER — Other Ambulatory Visit: Payer: Self-pay

## 2020-08-28 ENCOUNTER — Other Ambulatory Visit (INDEPENDENT_AMBULATORY_CARE_PROVIDER_SITE_OTHER): Payer: Medicare Other

## 2020-08-28 DIAGNOSIS — N183 Chronic kidney disease, stage 3 unspecified: Secondary | ICD-10-CM | POA: Diagnosis not present

## 2020-08-28 DIAGNOSIS — E538 Deficiency of other specified B group vitamins: Secondary | ICD-10-CM

## 2020-08-28 LAB — BASIC METABOLIC PANEL
BUN: 21 mg/dL (ref 6–23)
CO2: 29 mEq/L (ref 19–32)
Calcium: 8.6 mg/dL (ref 8.4–10.5)
Chloride: 104 mEq/L (ref 96–112)
Creatinine, Ser: 1.84 mg/dL — ABNORMAL HIGH (ref 0.40–1.50)
GFR: 32.42 mL/min — ABNORMAL LOW (ref >60.00)
Glucose, Bld: 145 mg/dL — ABNORMAL HIGH (ref 70–99)
Potassium: 4.1 mEq/L (ref 3.5–5.1)
Sodium: 137 mEq/L (ref 135–145)

## 2020-08-28 MED ORDER — CYANOCOBALAMIN 1000 MCG/ML IJ SOLN
1000.0000 ug | Freq: Once | INTRAMUSCULAR | Status: AC
  Administered 2020-08-28: 1000 ug via INTRAMUSCULAR

## 2020-08-28 NOTE — Progress Notes (Addendum)
Patient and his daughter came into the office today for nurse training on B12 injections. Patients daughter verbalized that she would be the one administering the B12 injections. This RN demonstrated how to properly draw up the medication from the vial and inject the medication into the muscle. This training was demonstrated on a practice muscle. This RN provided print off information on intramuscular administration of B12. The daughter then practiced on the muscle displaying proper technique. The patients daughter felt comfortable with administering the B12 medication to the patient using teach back method. The daughter demonstrated proper technique of administration and the patient tolerated injection well in his L deltoid. Patient and daughter were instructed to call or make a nurse visit appointment if they had any further questions or needed more training. Patient and daughter verbalized understanding.   Per orders of Dr. Damita Dunnings, injection of B12, given by patients daughter by the supervision of Aneta Mins, RN. Patient tolerated injection well in L Deltoid.

## 2020-08-28 NOTE — Addendum Note (Signed)
Addended by: Ronna Polio on: 08/28/2020 03:49 PM   Modules accepted: Orders, Level of Service

## 2020-09-01 ENCOUNTER — Other Ambulatory Visit: Payer: Self-pay | Admitting: Family Medicine

## 2020-09-03 DIAGNOSIS — M65331 Trigger finger, right middle finger: Secondary | ICD-10-CM | POA: Diagnosis not present

## 2020-09-03 DIAGNOSIS — M65332 Trigger finger, left middle finger: Secondary | ICD-10-CM | POA: Diagnosis not present

## 2020-09-09 DIAGNOSIS — R569 Unspecified convulsions: Secondary | ICD-10-CM | POA: Diagnosis not present

## 2020-09-23 DIAGNOSIS — M65331 Trigger finger, right middle finger: Secondary | ICD-10-CM | POA: Diagnosis not present

## 2020-11-02 ENCOUNTER — Encounter
Admission: EM | Disposition: A | Discharge status: Skilled Nursing Facility | Payer: Self-pay | Source: Home / Self Care | Attending: Family Medicine

## 2020-11-02 ENCOUNTER — Inpatient Hospital Stay: Payer: Medicare Other | Admitting: Anesthesiology

## 2020-11-02 ENCOUNTER — Other Ambulatory Visit: Payer: Self-pay

## 2020-11-02 ENCOUNTER — Inpatient Hospital Stay
Admission: EM | Admit: 2020-11-02 | Discharge: 2020-11-04 | DRG: 482 | Disposition: A | Discharge status: Skilled Nursing Facility | Payer: Medicare Other | Attending: Family Medicine | Admitting: Family Medicine

## 2020-11-02 ENCOUNTER — Inpatient Hospital Stay: Payer: Medicare Other

## 2020-11-02 ENCOUNTER — Emergency Department: Payer: Medicare Other

## 2020-11-02 DIAGNOSIS — N183 Chronic kidney disease, stage 3 unspecified: Secondary | ICD-10-CM | POA: Diagnosis present

## 2020-11-02 DIAGNOSIS — W108XXA Fall (on) (from) other stairs and steps, initial encounter: Secondary | ICD-10-CM | POA: Diagnosis present

## 2020-11-02 DIAGNOSIS — I129 Hypertensive chronic kidney disease with stage 1 through stage 4 chronic kidney disease, or unspecified chronic kidney disease: Secondary | ICD-10-CM | POA: Diagnosis present

## 2020-11-02 DIAGNOSIS — I10 Essential (primary) hypertension: Secondary | ICD-10-CM | POA: Diagnosis not present

## 2020-11-02 DIAGNOSIS — Y92008 Other place in unspecified non-institutional (private) residence as the place of occurrence of the external cause: Secondary | ICD-10-CM | POA: Diagnosis not present

## 2020-11-02 DIAGNOSIS — Z7982 Long term (current) use of aspirin: Secondary | ICD-10-CM

## 2020-11-02 DIAGNOSIS — R279 Unspecified lack of coordination: Secondary | ICD-10-CM | POA: Diagnosis not present

## 2020-11-02 DIAGNOSIS — E785 Hyperlipidemia, unspecified: Secondary | ICD-10-CM | POA: Diagnosis present

## 2020-11-02 DIAGNOSIS — Z8673 Personal history of transient ischemic attack (TIA), and cerebral infarction without residual deficits: Secondary | ICD-10-CM | POA: Diagnosis not present

## 2020-11-02 DIAGNOSIS — W19XXXA Unspecified fall, initial encounter: Principal | ICD-10-CM

## 2020-11-02 DIAGNOSIS — D519 Vitamin B12 deficiency anemia, unspecified: Secondary | ICD-10-CM | POA: Diagnosis not present

## 2020-11-02 DIAGNOSIS — R278 Other lack of coordination: Secondary | ICD-10-CM | POA: Diagnosis not present

## 2020-11-02 DIAGNOSIS — K2270 Barrett's esophagus without dysplasia: Secondary | ICD-10-CM | POA: Diagnosis not present

## 2020-11-02 DIAGNOSIS — S72141D Displaced intertrochanteric fracture of right femur, subsequent encounter for closed fracture with routine healing: Secondary | ICD-10-CM | POA: Diagnosis not present

## 2020-11-02 DIAGNOSIS — Z419 Encounter for procedure for purposes other than remedying health state, unspecified: Secondary | ICD-10-CM

## 2020-11-02 DIAGNOSIS — S72001D Fracture of unspecified part of neck of right femur, subsequent encounter for closed fracture with routine healing: Secondary | ICD-10-CM | POA: Diagnosis not present

## 2020-11-02 DIAGNOSIS — K219 Gastro-esophageal reflux disease without esophagitis: Secondary | ICD-10-CM | POA: Diagnosis present

## 2020-11-02 DIAGNOSIS — Z66 Do not resuscitate: Secondary | ICD-10-CM | POA: Diagnosis present

## 2020-11-02 DIAGNOSIS — S72009A Fracture of unspecified part of neck of unspecified femur, initial encounter for closed fracture: Secondary | ICD-10-CM

## 2020-11-02 DIAGNOSIS — Z20822 Contact with and (suspected) exposure to covid-19: Secondary | ICD-10-CM | POA: Diagnosis present

## 2020-11-02 DIAGNOSIS — Z85828 Personal history of other malignant neoplasm of skin: Secondary | ICD-10-CM

## 2020-11-02 DIAGNOSIS — Z043 Encounter for examination and observation following other accident: Secondary | ICD-10-CM | POA: Diagnosis not present

## 2020-11-02 DIAGNOSIS — R52 Pain, unspecified: Secondary | ICD-10-CM | POA: Diagnosis not present

## 2020-11-02 DIAGNOSIS — S72001A Fracture of unspecified part of neck of right femur, initial encounter for closed fracture: Secondary | ICD-10-CM | POA: Diagnosis present

## 2020-11-02 DIAGNOSIS — R2681 Unsteadiness on feet: Secondary | ICD-10-CM | POA: Diagnosis not present

## 2020-11-02 DIAGNOSIS — R262 Difficulty in walking, not elsewhere classified: Secondary | ICD-10-CM | POA: Diagnosis not present

## 2020-11-02 DIAGNOSIS — Z743 Need for continuous supervision: Secondary | ICD-10-CM | POA: Diagnosis not present

## 2020-11-02 DIAGNOSIS — M6281 Muscle weakness (generalized): Secondary | ICD-10-CM | POA: Diagnosis not present

## 2020-11-02 DIAGNOSIS — G4700 Insomnia, unspecified: Secondary | ICD-10-CM | POA: Diagnosis present

## 2020-11-02 DIAGNOSIS — Z87891 Personal history of nicotine dependence: Secondary | ICD-10-CM | POA: Diagnosis not present

## 2020-11-02 DIAGNOSIS — G40909 Epilepsy, unspecified, not intractable, without status epilepticus: Secondary | ICD-10-CM

## 2020-11-02 DIAGNOSIS — Z741 Need for assistance with personal care: Secondary | ICD-10-CM | POA: Diagnosis not present

## 2020-11-02 DIAGNOSIS — M25572 Pain in left ankle and joints of left foot: Secondary | ICD-10-CM | POA: Diagnosis not present

## 2020-11-02 DIAGNOSIS — S72141A Displaced intertrochanteric fracture of right femur, initial encounter for closed fracture: Secondary | ICD-10-CM | POA: Diagnosis present

## 2020-11-02 DIAGNOSIS — M25551 Pain in right hip: Secondary | ICD-10-CM | POA: Diagnosis not present

## 2020-11-02 DIAGNOSIS — M109 Gout, unspecified: Secondary | ICD-10-CM | POA: Diagnosis not present

## 2020-11-02 DIAGNOSIS — Z79899 Other long term (current) drug therapy: Secondary | ICD-10-CM | POA: Diagnosis not present

## 2020-11-02 DIAGNOSIS — Z8781 Personal history of (healed) traumatic fracture: Secondary | ICD-10-CM | POA: Diagnosis present

## 2020-11-02 DIAGNOSIS — G459 Transient cerebral ischemic attack, unspecified: Secondary | ICD-10-CM | POA: Diagnosis not present

## 2020-11-02 HISTORY — PX: INTRAMEDULLARY (IM) NAIL INTERTROCHANTERIC: SHX5875

## 2020-11-02 LAB — CBC WITH DIFFERENTIAL/PLATELET
Abs Immature Granulocytes: 0.02 10*3/uL (ref 0.00–0.07)
Basophils Absolute: 0.1 10*3/uL (ref 0.0–0.1)
Basophils Relative: 1 %
Eosinophils Absolute: 0.1 10*3/uL (ref 0.0–0.5)
Eosinophils Relative: 2 %
HCT: 39.4 % (ref 39.0–52.0)
Hemoglobin: 13.4 g/dL (ref 13.0–17.0)
Immature Granulocytes: 0 %
Lymphocytes Relative: 16 %
Lymphs Abs: 0.9 10*3/uL (ref 0.7–4.0)
MCH: 28.5 pg (ref 26.0–34.0)
MCHC: 34 g/dL (ref 30.0–36.0)
MCV: 83.8 fL (ref 80.0–100.0)
Monocytes Absolute: 0.4 10*3/uL (ref 0.1–1.0)
Monocytes Relative: 7 %
Neutro Abs: 4 10*3/uL (ref 1.7–7.7)
Neutrophils Relative %: 74 %
Platelets: 153 10*3/uL (ref 150–400)
RBC: 4.7 MIL/uL (ref 4.22–5.81)
RDW: 14.2 % (ref 11.5–15.5)
WBC: 5.4 10*3/uL (ref 4.0–10.5)
nRBC: 0 % (ref 0.0–0.2)

## 2020-11-02 LAB — COMPREHENSIVE METABOLIC PANEL
ALT: 19 U/L (ref 0–44)
AST: 28 U/L (ref 15–41)
Albumin: 4 g/dL (ref 3.5–5.0)
Alkaline Phosphatase: 89 U/L (ref 38–126)
Anion gap: 9 (ref 5–15)
BUN: 20 mg/dL (ref 8–23)
CO2: 24 mmol/L (ref 22–32)
Calcium: 9.2 mg/dL (ref 8.9–10.3)
Chloride: 107 mmol/L (ref 98–111)
Creatinine, Ser: 1.71 mg/dL — ABNORMAL HIGH (ref 0.61–1.24)
GFR, Estimated: 38 mL/min — ABNORMAL LOW (ref >60)
Glucose, Bld: 109 mg/dL — ABNORMAL HIGH (ref 70–99)
Potassium: 3.9 mmol/L (ref 3.5–5.1)
Sodium: 140 mmol/L (ref 135–145)
Total Bilirubin: 0.8 mg/dL (ref 0.3–1.2)
Total Protein: 8.1 g/dL (ref 6.5–8.1)

## 2020-11-02 LAB — PROTIME-INR
INR: 1 (ref 0.8–1.2)
Prothrombin Time: 13.1 seconds (ref 11.4–15.2)

## 2020-11-02 LAB — SARS CORONAVIRUS 2 BY RT PCR (HOSPITAL ORDER, PERFORMED IN ~~LOC~~ HOSPITAL LAB): SARS Coronavirus 2: NEGATIVE

## 2020-11-02 LAB — TYPE AND SCREEN
ABO/RH(D): O POS
Antibody Screen: NEGATIVE

## 2020-11-02 SURGERY — FIXATION, FRACTURE, INTERTROCHANTERIC, WITH INTRAMEDULLARY ROD
Anesthesia: General | Laterality: Right

## 2020-11-02 MED ORDER — MORPHINE SULFATE (PF) 2 MG/ML IV SOLN
2.0000 mg | Freq: Once | INTRAVENOUS | Status: AC
Start: 2020-11-02 — End: 2020-11-02
  Administered 2020-11-02: 2 mg via INTRAVENOUS
  Filled 2020-11-02: qty 1

## 2020-11-02 MED ORDER — FENTANYL CITRATE (PF) 100 MCG/2ML IJ SOLN
INTRAMUSCULAR | Status: AC
  Filled 2020-11-02: qty 2

## 2020-11-02 MED ORDER — ROSUVASTATIN CALCIUM 20 MG PO TABS
20.0000 mg | ORAL_TABLET | Freq: Every day | ORAL | Status: DC
  Administered 2020-11-03 – 2020-11-04 (×2): 20 mg via ORAL
  Filled 2020-11-02 (×2): qty 1

## 2020-11-02 MED ORDER — OXYCODONE HCL 5 MG PO TABS: 5.0000 mg | ORAL_TABLET | Freq: Once | ORAL | Status: DC | PRN

## 2020-11-02 MED ORDER — CEFAZOLIN SODIUM-DEXTROSE 2-3 GM-%(50ML) IV SOLR
INTRAVENOUS | Status: DC | PRN
  Administered 2020-11-02: 2 g via INTRAVENOUS

## 2020-11-02 MED ORDER — TRANEXAMIC ACID-NACL 1000-0.7 MG/100ML-% IV SOLN
INTRAVENOUS | Status: DC | PRN
  Administered 2020-11-02 (×2): 1000 mg via INTRAVENOUS

## 2020-11-02 MED ORDER — LIDOCAINE HCL (CARDIAC) PF 100 MG/5ML IV SOSY
PREFILLED_SYRINGE | INTRAVENOUS | Status: DC | PRN
  Administered 2020-11-02: 80 mg via INTRAVENOUS

## 2020-11-02 MED ORDER — PROPOFOL 10 MG/ML IV BOLUS
INTRAVENOUS | Status: DC | PRN
Start: 2020-11-02 — End: 2020-11-02
  Administered 2020-11-02: 100 mg via INTRAVENOUS

## 2020-11-02 MED ORDER — FENTANYL CITRATE (PF) 100 MCG/2ML IJ SOLN
INTRAMUSCULAR | Status: DC | PRN
  Administered 2020-11-02 (×2): 50 ug via INTRAVENOUS

## 2020-11-02 MED ORDER — LORAZEPAM 2 MG/ML IJ SOLN: 2.0000 mg | INTRAMUSCULAR | Status: DC | PRN

## 2020-11-02 MED ORDER — LEVETIRACETAM 500 MG PO TABS
500.0000 mg | ORAL_TABLET | Freq: Two times a day (BID) | ORAL | Status: DC
  Administered 2020-11-02 – 2020-11-04 (×4): 500 mg via ORAL
  Filled 2020-11-02 (×5): qty 1

## 2020-11-02 MED ORDER — SODIUM CHLORIDE 0.9 % IV SOLN: Freq: Once | INTRAVENOUS | Status: AC

## 2020-11-02 MED ORDER — TRANEXAMIC ACID 1000 MG/10ML IV SOLN
INTRAVENOUS | Status: AC
  Filled 2020-11-02: qty 20

## 2020-11-02 MED ORDER — ONDANSETRON HCL 4 MG/2ML IJ SOLN: 4.0000 mg | Freq: Four times a day (QID) | INTRAMUSCULAR | Status: DC | PRN

## 2020-11-02 MED ORDER — PROPOFOL 10 MG/ML IV BOLUS
INTRAVENOUS | Status: AC
  Filled 2020-11-02: qty 40

## 2020-11-02 MED ORDER — PANTOPRAZOLE SODIUM 40 MG PO TBEC
40.0000 mg | DELAYED_RELEASE_TABLET | Freq: Every day | ORAL | Status: DC
Start: 2020-11-03 — End: 2020-11-04
  Administered 2020-11-03 – 2020-11-04 (×2): 40 mg via ORAL
  Filled 2020-11-02 (×2): qty 1

## 2020-11-02 MED ORDER — SUGAMMADEX SODIUM 200 MG/2ML IV SOLN
INTRAVENOUS | Status: DC | PRN
  Administered 2020-11-02: 150 mg via INTRAVENOUS

## 2020-11-02 MED ORDER — HYDROCODONE-ACETAMINOPHEN 5-325 MG PO TABS
1.0000 | ORAL_TABLET | Freq: Four times a day (QID) | ORAL | Status: DC | PRN
  Administered 2020-11-03: 1 via ORAL

## 2020-11-02 MED ORDER — MORPHINE SULFATE (PF) 2 MG/ML IV SOLN: 2.0000 mg | INTRAVENOUS | Status: AC | PRN

## 2020-11-02 MED ORDER — PHENYLEPHRINE HCL (PRESSORS) 10 MG/ML IV SOLN
INTRAVENOUS | Status: DC | PRN
  Administered 2020-11-02: 100 ug via INTRAVENOUS

## 2020-11-02 MED ORDER — CEFAZOLIN SODIUM-DEXTROSE 1-4 GM/50ML-% IV SOLN: 1.0000 g | Freq: Three times a day (TID) | INTRAVENOUS | Status: DC

## 2020-11-02 MED ORDER — ONDANSETRON HCL 4 MG/2ML IJ SOLN
4.0000 mg | Freq: Once | INTRAMUSCULAR | Status: AC
  Administered 2020-11-02: 4 mg via INTRAVENOUS
  Filled 2020-11-02: qty 2

## 2020-11-02 MED ORDER — ACETAMINOPHEN 500 MG PO TABS
1000.0000 mg | ORAL_TABLET | Freq: Once | ORAL | Status: AC
  Administered 2020-11-02: 1000 mg via ORAL
  Filled 2020-11-02: qty 2

## 2020-11-02 MED ORDER — ROCURONIUM BROMIDE 100 MG/10ML IV SOLN
INTRAVENOUS | Status: DC | PRN
  Administered 2020-11-02: 40 mg via INTRAVENOUS

## 2020-11-02 MED ORDER — ONDANSETRON HCL 4 MG/2ML IJ SOLN
INTRAMUSCULAR | Status: DC | PRN
  Administered 2020-11-02: 4 mg via INTRAVENOUS

## 2020-11-02 MED ORDER — CEFAZOLIN SODIUM-DEXTROSE 1-4 GM/50ML-% IV SOLN
1.0000 g | Freq: Three times a day (TID) | INTRAVENOUS | Status: AC
  Administered 2020-11-03: 1 g via INTRAVENOUS

## 2020-11-02 MED ORDER — ENOXAPARIN SODIUM 30 MG/0.3ML ~~LOC~~ SOLN
30.0000 mg | SUBCUTANEOUS | Status: DC
  Administered 2020-11-03: 30 mg via SUBCUTANEOUS
  Filled 2020-11-02 (×3): qty 0.3

## 2020-11-02 MED ORDER — AMLODIPINE BESYLATE 5 MG PO TABS
5.0000 mg | ORAL_TABLET | Freq: Every day | ORAL | Status: DC
  Administered 2020-11-03: 5 mg via ORAL
  Filled 2020-11-02 (×2): qty 1

## 2020-11-02 MED ORDER — FENTANYL CITRATE (PF) 100 MCG/2ML IJ SOLN: 25.0000 ug | INTRAMUSCULAR | Status: DC | PRN

## 2020-11-02 MED ORDER — LACTATED RINGERS IV SOLN: INTRAVENOUS | Status: DC | PRN

## 2020-11-02 MED ORDER — OXYCODONE HCL 5 MG/5ML PO SOLN: 5.0000 mg | Freq: Once | ORAL | Status: DC | PRN

## 2020-11-02 MED ORDER — DEXAMETHASONE SODIUM PHOSPHATE 10 MG/ML IJ SOLN
INTRAMUSCULAR | Status: DC | PRN
  Administered 2020-11-02: 5 mg via INTRAVENOUS

## 2020-11-02 SURGICAL SUPPLY — 40 items
BIT DRILL CALIBRATED 4.2 (BIT) ×1 IMPLANT
BLADE TFNA HELICAL 95 NON STRL (Anchor) ×2 IMPLANT
BNDG COHESIVE 4X5 TAN STRL (GAUZE/BANDAGES/DRESSINGS) IMPLANT
BRUSH SCRUB EZ  4% CHG (MISCELLANEOUS) ×2
BRUSH SCRUB EZ 4% CHG (MISCELLANEOUS) ×2 IMPLANT
CANISTER SUCT 1200ML W/VALVE (MISCELLANEOUS) ×2 IMPLANT
CHLORAPREP W/TINT 26 (MISCELLANEOUS) ×2 IMPLANT
COVER WAND RF STERILE (DRAPES) ×2 IMPLANT
DRAPE 3/4 80X56 (DRAPES) ×2 IMPLANT
DRAPE U-SHAPE 47X51 STRL (DRAPES) ×2 IMPLANT
DRILL BIT CALIBRATED 4.2 (BIT) ×2
DRSG AQUACEL AG ADV 3.5X 4 (GAUZE/BANDAGES/DRESSINGS) IMPLANT
DRSG AQUACEL AG ADV 3.5X10 (GAUZE/BANDAGES/DRESSINGS) ×2 IMPLANT
ELECT REM PT RETURN 9FT ADLT (ELECTROSURGICAL) ×2
ELECTRODE REM PT RTRN 9FT ADLT (ELECTROSURGICAL) ×1 IMPLANT
GAUZE XEROFORM 1X8 LF (GAUZE/BANDAGES/DRESSINGS) ×2 IMPLANT
GLOVE INDICATOR 8.0 STRL GRN (GLOVE) ×2 IMPLANT
GLOVE SURG ORTHO LTX SZ8 (GLOVE) ×2 IMPLANT
GOWN STRL REUS W/ TWL LRG LVL3 (GOWN DISPOSABLE) ×1 IMPLANT
GOWN STRL REUS W/ TWL XL LVL3 (GOWN DISPOSABLE) ×1 IMPLANT
GOWN STRL REUS W/TWL LRG LVL3 (GOWN DISPOSABLE) ×1
GOWN STRL REUS W/TWL XL LVL3 (GOWN DISPOSABLE) ×1
GUIDEWIRE 3.2X400 (WIRE) ×2 IMPLANT
IMPL DEG TI CANN 11MM/130 (Orthopedic Implant) ×1 IMPLANT
IMPLANT DEG TI CANN 11MM/130 (Orthopedic Implant) ×2 IMPLANT
KIT PATIENT CARE HANA TABLE (KITS) ×2 IMPLANT
KIT TURNOVER CYSTO (KITS) ×2 IMPLANT
MANIFOLD NEPTUNE II (INSTRUMENTS) ×2 IMPLANT
MAT ABSORB  FLUID 56X50 GRAY (MISCELLANEOUS) ×1
MAT ABSORB FLUID 56X50 GRAY (MISCELLANEOUS) ×1 IMPLANT
NEEDLE SPNL 20GX3.5 QUINCKE YW (NEEDLE) ×2 IMPLANT
NS IRRIG 1000ML POUR BTL (IV SOLUTION) ×2 IMPLANT
PACK HIP COMPR (MISCELLANEOUS) ×2 IMPLANT
SCREW LOCK STAR 5X36 (Screw) ×2 IMPLANT
STAPLER SKIN PROX 35W (STAPLE) ×2 IMPLANT
SUT VIC AB 0 CT1 36 (SUTURE) ×2 IMPLANT
SUT VIC AB 2-0 CT1 27 (SUTURE) ×2
SUT VIC AB 2-0 CT1 TAPERPNT 27 (SUTURE) ×2 IMPLANT
SYR 30ML LL (SYRINGE) ×2 IMPLANT
TOWEL OR 17X26 4PK STRL BLUE (TOWEL DISPOSABLE) ×2 IMPLANT

## 2020-11-02 NOTE — Transfer of Care (Signed)
Immediate Anesthesia Transfer of Care Note  Patient: Justin Ford  Procedure(s) Performed: INTRAMEDULLARY (IM) NAIL INTERTROCHANTRIC (Right )  Patient Location: PACU  Anesthesia Type:General  Level of Consciousness: awake, alert  and oriented  Airway & Oxygen Therapy: Patient Spontanous Breathing  Post-op Assessment: Report given to RN and Post -op Vital signs reviewed and stable  Post vital signs: Reviewed and stable  Last Vitals:  Vitals Value Taken Time  BP    Temp    Pulse    Resp    SpO2      Last Pain:  Vitals:   11/02/20 1928  TempSrc:   PainSc: 1          Complications: No complications documented.

## 2020-11-02 NOTE — Anesthesia Procedure Notes (Signed)
Procedure Name: Intubation Date/Time: 11/02/2020 8:14 PM Performed by: Chanetta Marshall, CRNA Pre-anesthesia Checklist: Patient identified, Emergency Drugs available, Suction available and Patient being monitored Patient Re-evaluated:Patient Re-evaluated prior to induction Oxygen Delivery Method: Circle system utilized Preoxygenation: Pre-oxygenation with 100% oxygen Induction Type: IV induction Ventilation: Mask ventilation without difficulty Laryngoscope Size: McGraph and 3 Grade View: Grade I Tube type: Oral Tube size: 7.0 mm Number of attempts: 1 Airway Equipment and Method: Video-laryngoscopy Placement Confirmation: ETT inserted through vocal cords under direct vision,  positive ETCO2,  breath sounds checked- equal and bilateral and CO2 detector Secured at: 21 cm Tube secured with: Tape Dental Injury: Teeth and Oropharynx as per pre-operative assessment

## 2020-11-02 NOTE — Consult Note (Signed)
ORTHOPAEDIC CONSULTATION  PATIENT NAME: Justin Ford DOB: 1930/09/07  MRN: [id number]  REQUESTING PHYSICIAN: Cox, Amy Delane Ginger, DO  Chief Complaint: Right hip pain  HPI: Justin Ford is a 87 y.o. male who complains of  RIGHT hip pain after mechanical fall. No LOC/no chest pain. No numbness/tingling. Pt unable to bear weight. Not on blood thinners.   Past Medical History:  Diagnosis Date  . Barrett's esophagus   . Blood in stool    colonoscopy done ~2010  . Cancer ([place])    basal cell CA per derm  . GERD (gastroesophageal reflux disease)   . Hyperlipidemia   . Hypertension   . Kidney stones   . TIA (transient ischemic attack)    2016   Past Surgical History:  Procedure Laterality Date  . COLONOSCOPY WITH PROPOFOL N/A 04/18/2020   Procedure: COLONOSCOPY WITH PROPOFOL;  Surgeon: Virgel Manifold, MD;  Location: [facility] ENDOSCOPY;  Service: Endoscopy;  Laterality: N/A;  . ESOPHAGOGASTRODUODENOSCOPY (EGD) WITH PROPOFOL N/A 03/07/2020   Procedure: ESOPHAGOGASTRODUODENOSCOPY (EGD) WITH PROPOFOL;  Surgeon: Virgel Manifold, MD;  Location: [facility] ENDOSCOPY;  Service: Endoscopy;  Laterality: N/A;  . PROSTATE BIOPSY     neg x2 per patient  . SHOULDER SURGERY     per Dr. Theda Sers, R shoulder   Social History   Socioeconomic History  . Marital status: Married    Spouse name: Not on file  . Number of children: Not on file  . Years of education: Not on file  . Highest education level: Not on file  Occupational History  . Occupation: retired    Comment: Optometrist  Tobacco Use  . Smoking status: Former Smoker    Packs/day: 0.25    Years: 4.00    Pack years: 1.00    Quit date: 10/05/1950    Years since quitting: 70.1  . Smokeless tobacco: Never Used  Substance and Sexual Activity  . Alcohol use: Not Currently    Alcohol/week: 2.0 standard drinks    Types: 2 Cans of beer per week  . Drug use: No  . Sexual activity: Never  Other Topics Concern  . Not on file  Social History  Narrative   Married 1951   From [place].    Retired Optometrist   2 kids, 2 grandkids (grandkids are Radiation protection practitioner)   Social Determinants of Radio broadcast assistant Strain: Not on file  Food Insecurity: Not on file  Transportation Needs: Not on file  Physical Activity: Not on file  Stress: Not on file  Social Connections: Not on file   Family History  Problem Relation Age of Onset  . Dementia Mother   . Alcohol abuse Father   . Heart disease Brother   . Cancer Sister        possible breast cancer, pt was unsure  . Colon cancer Neg Hx   . Prostate cancer Neg Hx    Allergies  Allergen Reactions  . Lisinopril Other (See Comments)    Elevated creatinine-caution on dosing.  . Lipitor [Atorvastatin] Other (See Comments)    Reaction:  Unknown    Prior to Admission medications   Medication Sig Start Date End Date Taking? Authorizing Provider  amLODipine (NORVASC) 5 MG tablet Take 1 tablet (5 mg total) by mouth daily. 08/05/20   [name], MD  aspirin EC 81 MG tablet Take 81 mg by mouth daily. Swallow whole.    [provider]  cyanocobalamin (,VITAMIN B-12,) 1000  MCG/ML injection 1033mcg IM monthly 08/07/20   [name], MD  levETIRAcetam (KEPPRA) 500 MG tablet Take 1 tablet (500 mg total) by mouth 2 (two) times daily. 07/31/20 08/30/20  Merlyn Lot, MD  pantoprazole (PROTONIX) 40 MG tablet Take 1 tablet (40 mg total) by mouth daily. 06/07/20   [name], MD  rosuvastatin (CRESTOR) 20 MG tablet Take 1 tablet by mouth once daily 05/30/20   [name], MD   DG Chest 1 View  Result Date: 11/02/2020 CLINICAL DATA:  Right hip fracture.  Fell. EXAM: CHEST  1 VIEW COMPARISON:  07/26/2020 FINDINGS: Normal sized heart. Clear lungs with normal vascularity. Diffuse osteopenia superior migration of both humeral heads, compatible with chronic rotator cuff tears. IMPRESSION: No acute abnormality. Electronically Signed   By: Claudie Revering M.D.   On: 11/02/2020 16:39   DG Hip Unilat W or Wo Pelvis 2-3 Views Right  Result Date: 11/02/2020 CLINICAL DATA:  Right hip pain. EXAM: DG HIP (WITH OR WITHOUT PELVIS) 2-3V RIGHT COMPARISON:  None. FINDINGS: Mildly comminuted right intertrochanteric fracture. No mild distraction of the fragments without significant angulation. Moderate femoral head and neck junction spur formation on the right, mild on the left. Lower lumbar spine degenerative changes. Diffuse osteopenia. IMPRESSION: 1. Mildly comminuted right intertrochanteric fracture. 2. Mild to moderate right hip degenerative changes. Electronically Signed   By: Claudie Revering M.D.   On: 11/02/2020 16:38    Positive ROS: All other systems have been reviewed and were otherwise negative with the exception of those mentioned in the HPI and as above.  Physical Exam: General: Well developed, well nourished male seen in no acute distress. HEENT: Atraumatic and normocephalic. Sclera are clear. Extraocular motion is intact. Oropharynx is clear with moist mucosa. Neck: Supple, nontender, good range of motion. No JVD or carotid bruits. Lungs: Clear to auscultation bilaterally. Cardiovascular: Regular rate and rhythm with normal S1 and S2. No murmurs. No gallops or rubs. Pedal pulses are palpable bilaterally. Homans test is negative bilaterally. No significant pretibial or ankle edema. Abdomen: Soft, nontender, and nondistended. Bowel sounds are present. Skin: No lesions in the area of chief complaint Neurologic: Awake, alert, and oriented. Sensory function is grossly intact. Motor strength is felt to be 5 over 5 bilaterally. No clonus or tremor. Good motor coordination. Lymphatic: No axillary or cervical lymphadenopathy  MUSCULOSKELETAL: RLE shortened, ER. Skin intact. TTP about the hip. ROM and strength testing not attempted secondary to known injury. NVI.   Assessment: Right hip displaced intertrochanteric hip fracture  Plan: Discussed  r/b/a of operative and nonoperative management. Discussed risks of nonoperative management being complications of bed rest to include, but not limited to VTE, bed sores, pneumonia. Discussed that operative management has the goal of mitigating those risks by improving mobilization, but may not to be able to completely eliminate them. Discussed that operative management has the risks of, but not limited to, bleeding, infection, damage to surrounding nerves/blood vessels/cartilage/ligaments/tendsons, nonunion, malunion, hardware complications, continued pain, and potential for revision surgery. Further, it comes with the medical complications of anesthesia. The patient would like to proceed with operative intervention once optimized by the hospitalist service and deemed at an acceptable risk from the anesthesia provider.  -Pt optimized by hospitalist -To OR for R CMN.   Craig Guess, M.D.

## 2020-11-02 NOTE — ED Triage Notes (Signed)
Pt arrives to ER from home. Tripped and fell down 3 stairs. Arrives A&O, HOH. EMS denies LOC. R hip pain.

## 2020-11-02 NOTE — H&P (Addendum)
History and Physical   Justin Ford JJO:[id number] DOB: 12-20-1929 DOA: 11/02/2020  PCP: [name], MD  Outpatient Specialists: Dr. Jennings Books Patient coming from: home via EMS  I have personally briefly reviewed patient's old medical records in [place].  Chief Concern: fall   HPI: Justin Ford is a 87 y.o. male with medical history significant for hypertension, hyperlipidemia, seizure (multifocal epilepsy) controlled on keppra, he presents to the ED for chief concerns of fall while walking down three steps of stairs down into the garage.   He states that he was carrying too many files and documents that he was weighed down and fell. He denies lost of consciousness and hitting his head. He endorses landing on his right hip. He reports the pain is achy and tender and 5/10 and persistent. He denies numbness and tingling of his extremities. He denies shortness of breath, chest pain, dizziness, palpitations, vision changes prior to falling.   Social history: patient is retired and formerly was an Photographer. He lives with his daughter and she is his primary caregiver. He is former tobacco user and quit smoking in the 1950s. He infrequently consumes etoh and denies recreational drug use.   ROS: Constitutional: no weight change, no fever ENT/Mouth: no sore throat, no rhinorrhea Eyes: no eye pain, no vision changes Cardiovascular: no chest pain, no dyspnea,  no edema, no palpitations Respiratory: no cough, no sputum, no wheezing Gastrointestinal: no nausea, no vomiting, no diarrhea, no constipation Genitourinary: no urinary incontinence, no dysuria, no hematuria Musculoskeletal: no arthralgias, no myalgias Skin: no skin lesions, no pruritus, Neuro: + weakness, no loss of consciousness, no syncope Psych: no anxiety, no depression, + decrease appetite Heme/Lymph: no bruising, no bleeding  ED Course: Discussed with ED provider, patient requiring  hospitalization for mildly comminuted right intertrochanteric fracture.   Assessment/Plan  Principal Problem:   Closed right hip fracture, initial encounter ([place]) Active Problems:   HTN (hypertension)   HLD (hyperlipidemia)   Chronic kidney disease, stage III (moderate) (HCC)   Insomnia   Epilepsy undetermined as to focal or generalized ([place])   Closed mildly comminuted right intertrochanteric fracture-secondary to mechanical fall -Orthopedic surgeon has been consulted -Dr. Gaspar Bidding orthopedic surgeon will take patient to the OR -Pain management: Morphine 2 mg IV every 2 hours as needed for severe pain, hydrocodone 5, 1 to 2 tablets p.o. every 6 hours as needed for moderate pain -N.p.o.  Hypertension-elevated suspect secondary to pain -Resumed amlodipine 5 mg p.o. daily  Hyperlipidemia-rosuvastatin 20 mg  Multifocal epilepsy seizures-controlled -Per daughter patient last had seizure in October 2021 -Keppra 550 mg p.o. twice daily -Seizure precautions -Ativan 2 mg IV as needed for seizures, 2 doses ordered  As needed medications: Ondansetron, acetaminophen, morphine, Norco  Chart reviewed.   Addendum: After the OR, orthopedic surgeon recommends weightbearing as tolerated, antibiotic for 24 hours postop, cefazolin 1 g every 8 hours for 24 hours for 3 doses was ordered.  DVT prophylaxis for 6 weeks.  Per orthopedic surgeon, he states that if patient is mobile patient can have aspirin 325 mg daily and if patient is not mobile orthopedic surgeon recommends subcutaneous enoxaparin for 6 weeks for DVT prophylaxis.  DVT prophylaxis: Enoxaparin subcutaneous to be scheduled after surgery Code Status: DNR Diet: N.p.o. Family Communication: Called daughter Disposition Plan: Pending clinical course Consults called: Orthopedic Admission status: Inpatient  Past Medical History:  Diagnosis Date  . Barrett's esophagus   . Blood in stool  colonoscopy done ~2010  . Cancer ([place])     basal cell CA per derm  . GERD (gastroesophageal reflux disease)   . Hyperlipidemia   . Hypertension   . Kidney stones   . TIA (transient ischemic attack)    2016   Past Surgical History:  Procedure Laterality Date  . COLONOSCOPY WITH PROPOFOL N/A 04/18/2020   Procedure: COLONOSCOPY WITH PROPOFOL;  Surgeon: Virgel Manifold, MD;  Location: [facility] ENDOSCOPY;  Service: Endoscopy;  Laterality: N/A;  . ESOPHAGOGASTRODUODENOSCOPY (EGD) WITH PROPOFOL N/A 03/07/2020   Procedure: ESOPHAGOGASTRODUODENOSCOPY (EGD) WITH PROPOFOL;  Surgeon: Virgel Manifold, MD;  Location: [facility] ENDOSCOPY;  Service: Endoscopy;  Laterality: N/A;  . PROSTATE BIOPSY     neg x2 per patient  . SHOULDER SURGERY     per Dr. Theda Sers, R shoulder   Social History:  reports that he quit smoking about 70 years ago. He has a 1.00 pack-year smoking history. He has never used smokeless tobacco. He reports previous alcohol use of about 2.0 standard drinks of alcohol per week. He reports that he does not use drugs.  Allergies  Allergen Reactions  . Lisinopril Other (See Comments)    Elevated creatinine-caution on dosing.  . Lipitor [Atorvastatin] Other (See Comments)    Reaction:  Unknown    Family History  Problem Relation Age of Onset  . Dementia Mother   . Alcohol abuse Father   . Heart disease Brother   . Cancer Sister        possible breast cancer, pt was unsure  . Colon cancer Neg Hx   . Prostate cancer Neg Hx    Family history: Family history reviewed and not pertinent  Prior to Admission medications   Medication Sig Start Date End Date Taking? Authorizing Provider  amLODipine (NORVASC) 5 MG tablet Take 1 tablet (5 mg total) by mouth daily. 08/05/20   [name], MD  aspirin EC 81 MG tablet Take 81 mg by mouth daily. Swallow whole.    [provider]  cyanocobalamin (,VITAMIN B-12,) 1000 MCG/ML injection 1099mcg IM monthly 08/07/20   [name], MD  levETIRAcetam (KEPPRA) 500 MG tablet  Take 1 tablet (500 mg total) by mouth 2 (two) times daily. 07/31/20 08/30/20  Merlyn Lot, MD  pantoprazole (PROTONIX) 40 MG tablet Take 1 tablet (40 mg total) by mouth daily. 06/07/20   [name], MD  rosuvastatin (CRESTOR) 20 MG tablet Take 1 tablet by mouth once daily 05/30/20   [name], MD   Physical Exam: Vitals:   11/02/20 1529 11/02/20 1533 11/02/20 1600 11/02/20 1751  BP:  (!) 174/99 (!) 155/91 (!) 152/97  Pulse:  70 (!) 56 87  Resp:  18 10 (!) 22  Temp:  97.6 F (36.4 C)    TempSrc:  Oral    SpO2:  98% 98% 98%  Weight: 72.6 kg     Height: 5\' 7"  (1.702 m)      Constitutional: appears age appropriate, NAD, calm, comfortable Eyes: PERRL, lids and conjunctivae normal ENMT: Mucous membranes are moist. Posterior pharynx clear of any exudate or lesions. Age-appropriate dentition.  Mild hearing loss Neck: normal, supple, no masses, no thyromegaly Respiratory: clear to auscultation bilaterally, no wheezing, no crackles. Normal respiratory effort. No accessory muscle use.  Cardiovascular: Regular rate and rhythm, no murmurs / rubs / gallops. No extremity edema. 2+ pedal pulses. No carotid bruits.  Abdomen: no tenderness, no masses palpated, no hepatosplenomegaly. Bowel sounds positive.  Musculoskeletal: no clubbing /  cyanosis. No joint deformity upper and lower extremities. Good ROM, no contractures, no atrophy. Normal muscle tone.  Right hip pain Skin: no rashes, lesions, ulcers. No induration Neurologic: Sensation intact. Strength 5/5 in all 4.  Psychiatric: Normal judgment and insight. Alert and oriented x 3. Normal mood.   EKG: independently reviewed, showing sinus rhythm with artifact with rate 67, QTC 461  Chest x-ray on Admission: I personally reviewed and I agree with radiologist reading as below.  DG Chest 1 View  Result Date: 11/02/2020 CLINICAL DATA:  Right hip fracture.  Fell. EXAM: CHEST  1 VIEW COMPARISON:  07/26/2020 FINDINGS: Normal sized heart.  Clear lungs with normal vascularity. Diffuse osteopenia superior migration of both humeral heads, compatible with chronic rotator cuff tears. IMPRESSION: No acute abnormality. Electronically Signed   By: Claudie Revering M.D.   On: 11/02/2020 16:39   DG Hip Unilat W or Wo Pelvis 2-3 Views Right  Result Date: 11/02/2020 CLINICAL DATA:  Right hip pain. EXAM: DG HIP (WITH OR WITHOUT PELVIS) 2-3V RIGHT COMPARISON:  None. FINDINGS: Mildly comminuted right intertrochanteric fracture. No mild distraction of the fragments without significant angulation. Moderate femoral head and neck junction spur formation on the right, mild on the left. Lower lumbar spine degenerative changes. Diffuse osteopenia. IMPRESSION: 1. Mildly comminuted right intertrochanteric fracture. 2. Mild to moderate right hip degenerative changes. Electronically Signed   By: Claudie Revering M.D.   On: 11/02/2020 16:38   Labs on Admission: I have personally reviewed following labs  CBC: Recent Labs  Lab 11/02/20 1616  WBC 5.4  NEUTROABS 4.0  HGB 13.4  HCT 39.4  MCV 83.8  PLT 681   Basic Metabolic Panel: Recent Labs  Lab 11/02/20 1616  NA 140  K 3.9  CL 107  CO2 24  GLUCOSE 109*  BUN 20  CREATININE 1.71*  CALCIUM 9.2   GFR: Estimated Creatinine Clearance: 27.9 mL/min (A) (by C-G formula based on SCr of 1.71 mg/dL (H)). Liver Function Tests: Recent Labs  Lab 11/02/20 1616  AST 28  ALT 19  ALKPHOS 89  BILITOT 0.8  PROT 8.1  ALBUMIN 4.0   Coagulation Profile: Recent Labs  Lab 11/02/20 1616  INR 1.0   Helia Haese N Floy Angert D.O. [facility]  If 7PM-7AM, please contact overnight-coverage provider If 7AM-7PM, please contact day coverage provider [email or web address]  11/02/2020, 6:28 PM

## 2020-11-02 NOTE — Brief Op Note (Signed)
11/02/2020  9:21 PM  PATIENT:  Mercy Moore  87 y.o. male  PRE-OPERATIVE DIAGNOSIS:  right hip fracture  POST-OPERATIVE DIAGNOSIS:  right hip fracture  PROCEDURE:  Procedure(s): INTRAMEDULLARY (IM) NAIL INTERTROCHANTRIC (Right)  SURGEON:  Surgeon(s) and Role:    * Tashaya Ancrum, Ted Mcalpine, MD - Primary  PHYSICIAN ASSISTANT:   ASSISTANTS: none   ANESTHESIA:   general  EBL:  20 mL   BLOOD ADMINISTERED:none  DRAINS: none   LOCAL MEDICATIONS USED:  NONE  SPECIMEN:  No Specimen  DISPOSITION OF SPECIMEN:  N/A  COUNTS:  YES  TOURNIQUET:  * No tourniquets in log *  DICTATION: .Note written in EPIC  PLAN OF CARE: Admit to inpatient   PATIENT DISPOSITION:  PACU - hemodynamically stable.   Delay start of Pharmacological VTE agent (>24hrs) due to surgical blood loss or risk of bleeding: no

## 2020-11-02 NOTE — ED Provider Notes (Signed)
[facility] Emergency Department Provider Note  ____________________________________________   Event Date/Time   First MD Initiated Contact with Patient 11/02/20 1527     (approximate)  I have reviewed the triage vital signs and the nursing notes.   HISTORY  Chief Complaint Fall and Hip Pain    HPI Justin Ford is a 87 y.o. male with history of hypertension, hyperlipidemia, TIA, here with right hip pain after fall. The patient states that he was trying to carry something up a three-step entry in his house when he tripped, falling onto his right hip. He fell forwards then turned sideways. He states he experienced immediate onset of pain a 10, aching, throbbing hip pain. This is now minimal at rest but significant with any kind of movement of his hip. He has not tried or been able to bear weight on his leg. He is adamant he did not hit his head or neck. No chest pain. No other areas of pain. Is well prior to the fall. No lower extremity weakness or numbness. No abdominal pain.        Past Medical History:  Diagnosis Date  . Barrett's esophagus   . Blood in stool    colonoscopy done ~2010  . Cancer ([place])    basal cell CA per derm  . GERD (gastroesophageal reflux disease)   . Hyperlipidemia   . Hypertension   . Kidney stones   . TIA (transient ischemic attack)    2016    Patient Active Problem List   Diagnosis Date Noted  . Insomnia 08/07/2020  . Dysarthria 08/07/2020  . Gait abnormality 05/15/2020  . Angiodysplasia of intestinal tract   . Polyp of colon   . Melena   . Angiodysplasia of stomach   . Right foot drop 02/28/2017  . Left leg swelling 10/30/2016  . Gout 10/11/2016  . B12 deficiency 05/21/2016  . Bruising 05/21/2016  . Tremor 03/08/2016  . Hamstring strain 11/05/2015  . Multiple falls 11/05/2015  . CVA (cerebral infarction) 09/23/2015  . Lower back pain 09/12/2015  . Shoulder pain 09/12/2015  . Hemispheric carotid artery  syndrome 07/07/2015  . TIA (transient ischemic attack) 06/21/2015  . Hyperglycemia 08/09/2013  . Greater trochanteric bursitis 08/09/2013  . Trigger finger 06/21/2012  . Advance directive on file 03/22/2012  . Medicare annual wellness visit, subsequent 03/15/2012  . Dupuytren contracture 03/15/2012  . Cough 12/11/2011  . Barrett esophagus 10/12/2011  . Chronic kidney disease, stage III (moderate) ([place]) 10/12/2011  . HTN (hypertension) 09/27/2011  . HLD (hyperlipidemia) 09/27/2011  . GERD (gastroesophageal reflux disease) 09/27/2011    Past Surgical History:  Procedure Laterality Date  . COLONOSCOPY WITH PROPOFOL N/A 04/18/2020   Procedure: COLONOSCOPY WITH PROPOFOL;  Surgeon: Virgel Manifold, MD;  Location: [facility] ENDOSCOPY;  Service: Endoscopy;  Laterality: N/A;  . ESOPHAGOGASTRODUODENOSCOPY (EGD) WITH PROPOFOL N/A 03/07/2020   Procedure: ESOPHAGOGASTRODUODENOSCOPY (EGD) WITH PROPOFOL;  Surgeon: Virgel Manifold, MD;  Location: [facility] ENDOSCOPY;  Service: Endoscopy;  Laterality: N/A;  . PROSTATE BIOPSY     neg x2 per patient  . SHOULDER SURGERY     per Dr. Theda Sers, R shoulder    Prior to Admission medications   Medication Sig Start Date End Date Taking? Authorizing Provider  amLODipine (NORVASC) 5 MG tablet Take 1 tablet (5 mg total) by mouth daily. 08/05/20   [name], MD  aspirin EC 81 MG tablet Take 81 mg by mouth daily. Swallow whole.    [provider]  cyanocobalamin (,VITAMIN B-12,) 1000 MCG/ML injection 1075mcg IM monthly 08/07/20   [name], MD  levETIRAcetam (KEPPRA) 500 MG tablet Take 1 tablet (500 mg total) by mouth 2 (two) times daily. 07/31/20 08/30/20  Merlyn Lot, MD  pantoprazole (PROTONIX) 40 MG tablet Take 1 tablet (40 mg total) by mouth daily. 06/07/20   [name], MD  rosuvastatin (CRESTOR) 20 MG tablet Take 1 tablet by mouth once daily 05/30/20   [name], MD    Allergies Lisinopril and Lipitor  [atorvastatin]  Family History  Problem Relation Age of Onset  . Dementia Mother   . Alcohol abuse Father   . Heart disease Brother   . Cancer Sister        possible breast cancer, pt was unsure  . Colon cancer Neg Hx   . Prostate cancer Neg Hx     Social History Social History   Tobacco Use  . Smoking status: Former Smoker    Packs/day: 0.25    Years: 4.00    Pack years: 1.00    Quit date: 10/05/1950    Years since quitting: 70.1  . Smokeless tobacco: Never Used  Substance Use Topics  . Alcohol use: Not Currently    Alcohol/week: 2.0 standard drinks    Types: 2 Cans of beer per week  . Drug use: No    Review of Systems  Review of Systems  Constitutional: Negative for chills, fatigue and fever.  HENT: Negative for sore throat.   Respiratory: Negative for shortness of breath.   Cardiovascular: Negative for chest pain.  Gastrointestinal: Negative for abdominal pain.  Genitourinary: Negative for flank pain.  Musculoskeletal: Positive for arthralgias and gait problem. Negative for neck pain.  Skin: Negative for rash and wound.  Allergic/Immunologic: Negative for immunocompromised state.  Neurological: Negative for weakness and numbness.  Hematological: Does not bruise/bleed easily.     ____________________________________________  PHYSICAL EXAM:      VITAL SIGNS: ED Triage Vitals  Enc Vitals Group     BP 11/02/20 1533 (!) 174/99     Pulse Rate 11/02/20 1533 70     Resp 11/02/20 1533 18     Temp 11/02/20 1533 97.6 F (36.4 C)     Temp Source 11/02/20 1533 Oral     SpO2 11/02/20 1533 98 %     Weight 11/02/20 1529 160 lb (72.6 kg)     Height 11/02/20 1529 5\' 7"  (1.702 m)     Head Circumference --      Peak Flow --      Pain Score 11/02/20 1528 1     Pain Loc --      Pain Edu? --      Excl. in [place]? --      Physical Exam Vitals and nursing note reviewed.  Constitutional:      General: He is not in acute distress.    Appearance: He is well-developed and  well-nourished.  HENT:     Head: Normocephalic and atraumatic.  Eyes:     Conjunctiva/sclera: Conjunctivae normal.  Cardiovascular:     Rate and Rhythm: Normal rate and regular rhythm.     Heart sounds: Normal heart sounds.  Pulmonary:     Effort: Pulmonary effort is normal. No respiratory distress.     Breath sounds: No wheezing.  Abdominal:     General: There is no distension.  Musculoskeletal:        General: No edema.     Cervical back:  Neck supple.     Comments: Moderate tenderness to the right hip with passive range of motion. No significant tenderness to palpation. No bruising.  Skin:    General: Skin is warm.     Capillary Refill: Capillary refill takes less than 2 seconds.     Findings: No rash.  Neurological:     Mental Status: He is alert and oriented to person, place, and time.     Motor: No abnormal muscle tone.     Comments: Strength out of 5 bilateral lower extremities. Normal sensation to light touch.       ____________________________________________   LABS (all labs ordered are listed, but only abnormal results are displayed)  Labs Reviewed  COMPREHENSIVE METABOLIC PANEL - Abnormal; Notable for the following components:      Result Value   Glucose, Bld 109 (*)    Creatinine, Ser 1.71 (*)    GFR, Estimated 38 (*)    All other components within normal limits  SARS CORONAVIRUS 2 BY RT PCR (HOSPITAL ORDER, [place] [facility])  CBC WITH DIFFERENTIAL/PLATELET  PROTIME-INR  TYPE AND SCREEN    ____________________________________________  EKG: Normal sinus rhythm, ventricular rate 67. QRS 140, QTc 461. No acute ST elevation or depression. Moderate baseline artifact. ________________________________________  RADIOLOGY All imaging, including plain films, CT scans, and ultrasounds, independently reviewed by me, and interpretations confirmed via formal radiology reads.  ED MD interpretation:   DG Hip right: Right IT  Hip fx CXR:  Clear  Official radiology report(s): DG Chest 1 View  Result Date: 11/02/2020 CLINICAL DATA:  Right hip fracture.  Fell. EXAM: CHEST  1 VIEW COMPARISON:  07/26/2020 FINDINGS: Normal sized heart. Clear lungs with normal vascularity. Diffuse osteopenia superior migration of both humeral heads, compatible with chronic rotator cuff tears. IMPRESSION: No acute abnormality. Electronically Signed   By: Claudie Revering M.D.   On: 11/02/2020 16:39   DG Hip Unilat W or Wo Pelvis 2-3 Views Right  Result Date: 11/02/2020 CLINICAL DATA:  Right hip pain. EXAM: DG HIP (WITH OR WITHOUT PELVIS) 2-3V RIGHT COMPARISON:  None. FINDINGS: Mildly comminuted right intertrochanteric fracture. No mild distraction of the fragments without significant angulation. Moderate femoral head and neck junction spur formation on the right, mild on the left. Lower lumbar spine degenerative changes. Diffuse osteopenia. IMPRESSION: 1. Mildly comminuted right intertrochanteric fracture. 2. Mild to moderate right hip degenerative changes. Electronically Signed   By: Claudie Revering M.D.   On: 11/02/2020 16:38    ____________________________________________  PROCEDURES   Procedure(s) performed (including Critical Care):  .1-3 Lead EKG Interpretation Performed by: Duffy Bruce, MD Authorized by: Duffy Bruce, MD     Interpretation: normal     ECG rate:  50-70   ECG rate assessment: normal     Rhythm: sinus bradycardia     Ectopy: none     Conduction: normal   Comments:     Indication: Fall    ____________________________________________  INITIAL IMPRESSION / MDM / ASSESSMENT AND PLAN / ED COURSE  As part of my medical decision making, I reviewed the following data within the [facility] notes reviewed and incorporated, Old chart reviewed, Notes from prior ED visits, and [place] Controlled Substance Database       *STRYDER POITRA was evaluated in Emergency Department on 11/02/2020 for the symptoms  described in the history of present illness. He was evaluated in the context of the global COVID-19 pandemic, which necessitated consideration that the  patient might be at risk for infection with the SARS-CoV-2 virus that causes COVID-19. Institutional protocols and algorithms that pertain to the evaluation of patients at risk for COVID-19 are in a state of rapid change based on information released by regulatory bodies including the CDC and federal and state organizations. These policies and algorithms were followed during the patient's care in the ED.  Some ED evaluations and interventions may be delayed as a result of limited staffing during the pandemic.*     Medical Decision Making: 87 year old male here with right hip pain after mechanical fall.  No other evidence of trauma.  He is adamant he did not hit his head.  Distal neuro vasculature is intact.  Imaging shows right intertrochanteric hip fracture.  Chest x-ray is unremarkable.  Lab work shows normal white count and hemoglobin.  Creatinine shows baseline CKD without acute changes.  Discussed case with Dr. Gaspar Bidding.  Likely OR tomorrow.  Patient will need to be n.p.o. at midnight.  Admit to medicine.  Tylenol, morphine given for pain with improvement.  Daughter updated by myself.  ____________________________________________  FINAL CLINICAL IMPRESSION(S) / ED DIAGNOSES  Final diagnoses:  Fall, initial encounter  Closed fracture of right hip, initial encounter [facility])     MEDICATIONS GIVEN DURING THIS VISIT:  Medications  acetaminophen (TYLENOL) tablet 1,000 mg (1,000 mg Oral Given 11/02/20 1701)  morphine 2 MG/ML injection 2 mg (2 mg Intravenous Given 11/02/20 1659)  ondansetron (ZOFRAN) injection 4 mg (4 mg Intravenous Given 11/02/20 1658)  0.9 %  sodium chloride infusion ( Intravenous New Bag/Given 11/02/20 1657)     ED Discharge Orders    None       Note:  This document was prepared using Dragon voice recognition software and may  include unintentional dictation errors.   Duffy Bruce, MD 11/02/20 1719

## 2020-11-02 NOTE — Anesthesia Preprocedure Evaluation (Addendum)
Anesthesia Evaluation  Patient identified by MRN, date of birth, ID band Patient awake    Reviewed: Allergy & Precautions, H&P , NPO status , Patient's Chart, lab work & pertinent test results  History of Anesthesia Complications Negative for: history of anesthetic complications  Airway Mallampati: III  TM Distance: >3 FB Neck ROM: limited    Dental  (+) Chipped, Missing, Poor Dentition   Pulmonary neg pulmonary ROS, neg shortness of breath, former smoker,    Pulmonary exam normal        Cardiovascular Exercise Tolerance: Good hypertension, (-) angina(-) Past MI and (-) DOE Normal cardiovascular exam     Neuro/Psych Seizures -,  TIA Neuromuscular disease negative psych ROS   GI/Hepatic Neg liver ROS, GERD  Medicated and Controlled,  Endo/Other  negative endocrine ROS  Renal/GU CRFRenal disease     Musculoskeletal   Abdominal   Peds  Hematology negative hematology ROS (+)   Anesthesia Other Findings Past Medical History: No date: Barrett's esophagus No date: Blood in stool     Comment:  colonoscopy done ~2010 No date: Cancer ([place])     Comment:  basal cell CA per derm No date: GERD (gastroesophageal reflux disease) No date: Hyperlipidemia No date: Hypertension No date: Kidney stones No date: TIA (transient ischemic attack)     Comment:  2016  Past Surgical History: 04/18/2020: COLONOSCOPY WITH PROPOFOL; N/A     Comment:  Procedure: COLONOSCOPY WITH PROPOFOL;  Surgeon:               Virgel Manifold, MD;  Location: [facility] ENDOSCOPY;                Service: Endoscopy;  Laterality: N/A; 03/07/2020: ESOPHAGOGASTRODUODENOSCOPY (EGD) WITH PROPOFOL; N/A     Comment:  Procedure: ESOPHAGOGASTRODUODENOSCOPY (EGD) WITH               PROPOFOL;  Surgeon: Virgel Manifold, MD;  Location:               [facility] ENDOSCOPY;  Service: Endoscopy;  Laterality: N/A; No date: PROSTATE BIOPSY     Comment:  neg x2 per patient No  date: SHOULDER SURGERY     Comment:  per Dr. Theda Sers, R shoulder  BMI    Body Mass Index: 25.06 kg/m      Reproductive/Obstetrics negative OB ROS                           Anesthesia Physical Anesthesia Plan  ASA: III  Anesthesia Plan: General ETT   Post-op Pain Management:    Induction: Intravenous  PONV Risk Score and Plan: Ondansetron, Dexamethasone, Midazolam and Treatment may vary due to age or medical condition  Airway Management Planned: Oral ETT  Additional Equipment:   Intra-op Plan:   Post-operative Plan: Extubation in OR  Informed Consent: I have reviewed the patients History and Physical, chart, labs and discussed the procedure including the risks, benefits and alternatives for the proposed anesthesia with the patient or authorized representative who has indicated his/her understanding and acceptance.   Patient has DNR.  Discussed DNR with patient and Suspend DNR.   Dental Advisory Given  Plan Discussed with: Anesthesiologist, CRNA and Surgeon  Anesthesia Plan Comments: (Patient consented for risks of anesthesia including but not limited to:  - adverse reactions to medications - damage to eyes, teeth, lips or other oral mucosa - nerve damage due to positioning  - sore throat or hoarseness - Damage  to heart, brain, nerves, lungs, other parts of body or loss of life  Patient voiced understanding.)        Anesthesia Quick Evaluation

## 2020-11-02 NOTE — Op Note (Signed)
OPERATIVE NOTE  DATE OF SURGERY:  11/02/2020  PATIENT NAME:  Justin Ford   DOB: 04-04-1930  MRN: [id number]   PRE-OPERATIVE DIAGNOSIS:  Right IT hip fracture  POST-OPERATIVE DIAGNOSIS:  Same  PROCEDURE:  Right hip closed reduction, cephalomedullary nail placement  SURGEON:  Craig Guess, M.D.   ASSISTANT: None  ANESTHESIA: general  ESTIMATED BLOOD LOSS: 100 mL  FLUIDS REPLACED: See anesthesia record  TOURNIQUET TIME: None   DRAINS: None  IMPLANTS UTILIZED: Synthes 50m x1765m130 deg TFNA with 9556LOocking helical blade and 3675IEistal interlock of appropriate length.   INDICATIONS FOR SURGERY: Justin NODARSEs a [age].o. year old male who has a Right IT fx after mechanical fall. After discussion of the risks and benefits of surgical intervention, the patient expressed understanding of the risks benefits and agree with plans for RIGHT Hip closed reduction and cephallomedullary nail placement. . [name] PROCEDURE IN DETAIL:   Patient was met in the pre-operative holding area where surgical site, procedure, and patient were verified. The site was marked with my initials. He was taken to the operating room and general anesthesia was induced. He was transferred to the fracture table with all bony prominences padded. A surgical timeout was performed. Closed reduction under fluoroscopy ensued ensuring we could get adequate imaging. The fracture was amenable to closed reduction. The extremity was then prepped and draped in standard sterile fashion. A timeout was performed verifying patient, surgical site, procedure, antibiotics, and pre-operative TXA. All in the room were in agreement.   A 5 cm incision was made proximal to the tip of the trochanter. A guidewire was placed just medial to the tip of the trochanter in the center of the shaft. This was advanced into the shaft and the opening reamer was used, ensuring not to diastasis the fracture site. The appropriate sized nail was then placed.  There was minor loss of reduction. An incision was made distally for the head screw and distal interlock. A bone hook was used to help reduce the fracture and the head screw guide wire was placed. It was verified to be in the center of the head on the lateral fluoroscopy and adjacent to the calcar on the AP fluoroscopy. This was measured. The lateral cortex opening reamer was used. The appropriate sized blade was then placed, again verifying on multiple fluoroscopy views to not be penetrating subchondral bone. This was checked with the approach and withdraw method. The distal interlock was then placed.   Final fluoroscopy showed adequate reduction and hardware placement. The guide was removed. Hemostasis was obtained, the wounds were irrigated. The deep fascia was closed with 0-vicryl, the skin was closed with 2-0 vicryl and staples. The wounds were dressed with xerofoarm, gauze, and tegaderms.   AFTERCARE: The patient will be WBAT. He will need 24hrs of post operative antibiotics. He will need dvt prophylaxis starting tomorrow for 6 wks post operatively. His disposition will be pending his mobilization. He will follow up with Dr. BoHarlow Maresn 10-14 days for a wound check.    KyCraig GuessM.D.

## 2020-11-02 NOTE — ED Notes (Signed)
Last meal 12:20, ate some oatmeal. Informed OR nurse of that time.

## 2020-11-03 DIAGNOSIS — S72001A Fracture of unspecified part of neck of right femur, initial encounter for closed fracture: Secondary | ICD-10-CM | POA: Diagnosis not present

## 2020-11-03 LAB — CBC
HCT: 32.1 % — ABNORMAL LOW (ref 39.0–52.0)
Hemoglobin: 11 g/dL — ABNORMAL LOW (ref 13.0–17.0)
MCH: 28.6 pg (ref 26.0–34.0)
MCHC: 34.3 g/dL (ref 30.0–36.0)
MCV: 83.4 fL (ref 80.0–100.0)
Platelets: 150 10*3/uL (ref 150–400)
RBC: 3.85 MIL/uL — ABNORMAL LOW (ref 4.22–5.81)
RDW: 14.3 % (ref 11.5–15.5)
WBC: 8.7 10*3/uL (ref 4.0–10.5)
nRBC: 0 % (ref 0.0–0.2)

## 2020-11-03 MED ORDER — CEFAZOLIN SODIUM-DEXTROSE 1-4 GM/50ML-% IV SOLN
INTRAVENOUS | Status: AC
  Filled 2020-11-03: qty 50

## 2020-11-03 MED ORDER — HYDROCODONE-ACETAMINOPHEN 5-325 MG PO TABS
ORAL_TABLET | ORAL | Status: AC
  Administered 2020-11-03: 2 via ORAL
  Filled 2020-11-03: qty 1

## 2020-11-03 MED ORDER — CEFAZOLIN SODIUM-DEXTROSE 1-4 GM/50ML-% IV SOLN
INTRAVENOUS | Status: AC
  Administered 2020-11-03: 1 g via INTRAVENOUS
  Filled 2020-11-03: qty 50

## 2020-11-03 MED ORDER — HYDROCODONE-ACETAMINOPHEN 5-325 MG PO TABS
ORAL_TABLET | ORAL | Status: AC
  Filled 2020-11-03: qty 2

## 2020-11-03 MED ORDER — SODIUM CHLORIDE FLUSH 0.9 % IV SOLN
INTRAVENOUS | Status: AC
  Filled 2020-11-03: qty 10

## 2020-11-03 MED ORDER — MORPHINE SULFATE (PF) 2 MG/ML IV SOLN
INTRAVENOUS | Status: AC
  Administered 2020-11-03: 2 mg via INTRAVENOUS
  Filled 2020-11-03: qty 1

## 2020-11-03 NOTE — TOC Initial Note (Signed)
Transition of Care [facility]) - Initial/Assessment Note    Patient Details  Name: Justin Ford MRN: [id number] Date of Birth: 02/03/1930  Transition of Care [facility] CM/SW Contact:    Berenice Bouton, LCSW Phone Number: 11/03/2020, 2:01 PM  Clinical Narrative:    CSW met with patient at bedside to discussed discharge plan. Social worker explained to the patient reason for the consult.  Physical therapist recommended SNF. CSW explain HIPPA.    Patient is an 87 year old male who presented [facility] ER from home after a fall. Patient reported he lives with his wife who is also in her 3's.  Noted that his wife cannot care for him and he would like to receive physical therapy at an inpatient facility.   CSW explained the rationale for recommendation to SNF. Patient agreed to SNF bed search. Patient stated that his preference is a SNF in [place].   Plan:  Patient will discharge to SNF.  1. Bed search started 2. PASRR #  [id number] A 3. FL2 completed  SW will continue to follow this patient.                Expected Discharge Plan: Skilled Nursing Facility Barriers to Discharge: Continued Medical Work up,SNF Pending bed offer   Patient Goals and CMS Choice Patient states their goals for this hospitalization and ongoing recovery are:: Return home after getting strong CMS Medicare.gov Compare Post Acute Care list provided to:: Patient Choice offered to / list presented to : Patient  Expected Discharge Plan and Services Expected Discharge Plan: [place]    Living arrangements for the past 2 months: Single Family Home                   Prior Living Arrangements/Services Living arrangements for the past 2 months: Single Family Home Lives with:: Spouse Patient language and need for interpreter reviewed:: No Do you feel safe going back to the place where you live?: Yes      Need for Family Participation in Patient Care: No (Comment) Care giver support system in place?: Yes  (comment)   Criminal Activity/Legal Involvement Pertinent to Current Situation/Hospitalization: No - Comment as needed  Activities of Daily Living Home Assistive Devices/Equipment: None ADL Screening (condition at time of admission) Patient's cognitive ability adequate to safely complete daily activities?: Yes Is the patient deaf or have difficulty hearing?: No Does the patient have difficulty seeing, even when wearing glasses/contacts?: No Does the patient have difficulty concentrating, remembering, or making decisions?: No Patient able to express need for assistance with ADLs?: Yes Does the patient have difficulty dressing or bathing?: No Independently performs ADLs?: Yes (appropriate for developmental age) Does the patient have difficulty walking or climbing stairs?: Yes Weakness of Legs: None Weakness of Arms/Hands: None  Permission Sought/Granted Permission sought to share information with : Case Manager,Facility Contact Representative,Family Supports Permission granted to share information with : Yes, Verbal Permission Granted  Share Information with NAME: Darliss Cheney, daughter, [phone]  Permission granted to share info w AGENC[facility]  Permission granted to share info w Relationship: Caesar Mannella, spouse, [phone]     Emotional Assessment Appearance:: Appears stated age Attitude/Demeanor/Rapport: Gracious,Self-Confident,Charismatic Affect (typically observed): Euphoric   Alcohol / Substance Use: Not Applicable Psych Involvement: No (comment)  Admission diagnosis:  Surgery, elective [Z41.9] Fall [W19.XXXA] Fall, initial encounter [id number]] Closed fracture of right hip, initial encounter ([place]) [S72.001A] Closed right hip fracture, initial encounter [facility]) [S72.001A] Patient Active Problem List   Diagnosis Date Noted  .  Closed right hip fracture, initial encounter ([place]) 11/02/2020  . Epilepsy undetermined as to focal or generalized ([place])  11/02/2020  . Insomnia 08/07/2020  . Dysarthria 08/07/2020  . Gait abnormality 05/15/2020  . Angiodysplasia of intestinal tract   . Polyp of colon   . Melena   . Angiodysplasia of stomach   . Right foot drop 02/28/2017  . Left leg swelling 10/30/2016  . Gout 10/11/2016  . B12 deficiency 05/21/2016  . Bruising 05/21/2016  . Tremor 03/08/2016  . Hamstring strain 11/05/2015  . Multiple falls 11/05/2015  . CVA (cerebral infarction) 09/23/2015  . Lower back pain 09/12/2015  . Shoulder pain 09/12/2015  . 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 TIA (transient ischemic attack) 06/21/2015  . Hyperglycemia 08/09/2013  . Greater trochanteric bursitis 08/09/2013  . Trigger finger 06/21/2012  . Advance directive on file 03/22/2012  . Medicare annual wellness visit, subsequent 03/15/2012  . Dupuytren contracture 03/15/2012  . Cough 12/11/2011  . Barrett esophagus 10/12/2011  . Chronic kidney disease, stage III (moderate) ([place]) 10/12/2011  . HTN (hypertension) 09/27/2011  . HLD (hyperlipidemia) 09/27/2011  . GERD (gastroesophageal reflux disease) 09/27/2011   PCP:  [name], MD Pharmacy:   [facility] [address] [place] [address] [place] [address] Phone: [phone] Fax: [phone]  [id number] [id number]: [phone]: [phone] [id number] [place] [id number]: [phone]: [phone] Determinants of Health (SDOH) Interventions   Readmission Risk Interventions No flowsheet data found.

## 2020-11-03 NOTE — Progress Notes (Signed)
Patient sitting up in recliner at bedside.  Alert, jovial and talkative.  Watching TV for distraction.  Denies any pain at this time, states that he is pleased with progress-up with PT x2 today.  Obtained Incentive spirometer and instructed patient in use.  Call light in reach, states "I will call when I am ready to get back to bed, I won't try to do it on my own".

## 2020-11-03 NOTE — Progress Notes (Signed)
PROGRESS NOTE    Justin Ford  WEX:[id number] DOB: 01/08/1930 DOA: 11/02/2020 PCP: [name], MD   Brief Narrative:  This 87 years old male with PMH significant for hypertension, hyperlipidemia, seizures(multifocal epilepsy) controlled on Keppra presents in the emergency department status post fall while walking down 3 steps of stairs down into the garage.  Patient reports he was carrying too many files and documents and fell.  He denies loss of consciousness and head injury.  He endorses landing on his right hip and has developed right hip pain.  X-ray hip shows comminuted right intertrochanteric fracture.  Orthopedic consulted,  Patient underwent open reduction internal fixation postoperative day 1,  tolerated well.  PT recommended skilled nursing facility for rehab.   Assessment & Plan:   Principal Problem:   Closed right hip fracture, initial encounter ([place]) Active Problems:   HTN (hypertension)   HLD (hyperlipidemia)   Chronic kidney disease, stage III (moderate) (H[facility])   Insomnia   Epilepsy undetermined as to focal or generalized ([place])   Closed mildly comminuted right intertrochanteric fracture-secondary to mechanical fall Orthopedic surgeon has been consulted. Patient underwent right intramedullary nail placement.  Postoperative day 1. Patient tolerated procedure well, reports pain is better controlled. Pain management: Morphine 2 mg IV every 2 hours as needed for severe pain, hydrocodone 5, 1 to 2 tablets p.o. every 6 hours as needed for moderate pain Ortho recommended weightbearing as tolerated, antibiotics for 24 hours postop.  DVT prophylaxis for 6 weeks. If patient is mobile patient can have aspirin 325 mg daily and if patient is not mobile orthopedic surgeon recommends subcutaneous enoxaparin for 6 weeks for DVT prophylaxis.   Hypertension: Blood pressure remains slightly elevated because of the pain Resumed amlodipine 5 mg p.o.  daily  Hyperlipidemia Continue rosuvastatin 20 mg  Multifocal epilepsy seizures-controlled -Per daughter patient last had seizure in October 2021 -Keppra 550 mg p.o. twice daily -Seizure precautions -Ativan 2 mg IV as needed for seizures, 2 doses ordered    DVT prophylaxis: Lovenox Code Status: DNR Family Communication: No family at bedside Disposition Plan:   Status is: Inpatient  Remains inpatient appropriate because:Inpatient level of care appropriate due to severity of illness   Dispo: The patient is from: Home              Anticipated d/c is to: SNF              Anticipated d/c date is: 2 days              Patient currently is not medically stable to d/c.   Difficult to place patient No   Consultants:   Orthopedics  Procedures: Right intramedullary nail intertrochanteric. Antimicrobials:  Anti-infectives (From admission, onward)   Start     Dose/Rate Route Frequency Ordered Stop   11/03/20 1340  ceFAZolin (ANCEF) 1-4 GM/50ML-% IVPB       Note to Pharmacy: Delena Serve   : cabinet override      11/03/20 1340 11/04/20 0144   11/03/20 0430  ceFAZolin (ANCEF) IVPB 1 g/50 mL premix        1 g 100 mL/hr over 30 Minutes Intravenous Every 8 hours 11/02/20 2337 11/04/20 0429   11/02/20 2200  ceFAZolin (ANCEF) IVPB 1 g/50 mL premix  Status:  Discontinued        1 g 100 mL/hr over 30 Minutes Intravenous Every 8 hours 11/02/20 2142 11/02/20 2337      Subjective: Patient was seen and examined at  bedside. overnight events noted.   Patient is s/p ORIF postoperative day 1.  He reports pain is better controlled.  Objective: Vitals:   11/03/20 0351 11/03/20 0648 11/03/20 0730 11/03/20 1100  BP: (!) 145/91 132/79    Pulse: 81 78 85 (!) 152  Resp: 19 12 15  (!) 23  Temp: 98 F (36.7 C)  98.7 F (37.1 C)   TempSrc: Temporal     SpO2: 97% 95% 98% 95%  Weight:      Height:        Intake/Output Summary (Last 24 hours) at 11/03/2020 1416 Last data filed at  11/03/2020 0418 Gross per 24 hour  Intake 540 ml  Output 220 ml  Net 320 ml   Filed Weights   11/02/20 1529  Weight: 72.6 kg    Examination:  General exam: Appears calm and comfortable, not in any distress.  Respiratory system: Clear to auscultation. Respiratory effort normal. Cardiovascular system: S1 & S2 heard, RRR. No JVD, murmurs, rubs, gallops or clicks. No pedal edema. Gastrointestinal system: Abdomen is nondistended, soft and nontender. No organomegaly or masses felt. Normal bowel sounds heard. Central nervous system: Alert and oriented. No focal neurological deficits. Extremities: No edema, No cyanosis, Right Hip ORIF, mild tenderness noted. Skin: No rashes, lesions or ulcers Psychiatry: Judgement and insight appear normal. Mood & affect appropriate.     Data Reviewed: I have personally reviewed following labs and imaging studies  CBC: Recent Labs  Lab 11/02/20 1616 11/03/20 0838  WBC 5.4 8.7  NEUTROABS 4.0  --   HGB 13.4 11.0*  HCT 39.4 32.1*  MCV 83.8 83.4  PLT 153 536   Basic Metabolic Panel: Recent Labs  Lab 11/02/20 1616  NA 140  K 3.9  CL 107  CO2 24  GLUCOSE 109*  BUN 20  CREATININE 1.71*  CALCIUM 9.2   GFR: Estimated Creatinine Clearance: 27.9 mL/min (A) (by C-G formula based on SCr of 1.71 mg/dL (H)). Liver Function Tests: Recent Labs  Lab 11/02/20 1616  AST 28  ALT 19  ALKPHOS 89  BILITOT 0.8  PROT 8.1  ALBUMIN 4.0   No results for input(s): LIPASE, AMYLASE in the last 168 hours. No results for input(s): AMMONIA in the last 168 hours. Coagulation Profile: Recent Labs  Lab 11/02/20 1616  INR 1.0   Cardiac Enzymes: No results for input(s): CKTOTAL, CKMB, CKMBINDEX, TROPONINI in the last 168 hours. BNP (last 3 results) No results for input(s): PROBNP in the last 8760 hours. HbA1C: No results for input(s): HGBA1C in the last 72 hours. CBG: No results for input(s): GLUCAP in the last 168 hours. Lipid Profile: No results for  input(s): CHOL, HDL, LDLCALC, TRIG, CHOLHDL, LDLDIRECT in the last 72 hours. Thyroid Function Tests: No results for input(s): TSH, T4TOTAL, FREET4, T3FREE, THYROIDAB in the last 72 hours. Anemia Panel: No results for input(s): VITAMINB12, FOLATE, FERRITIN, TIBC, IRON, RETICCTPCT in the last 72 hours. Sepsis Labs: No results for input(s): PROCALCITON, LATICACIDVEN in the last 168 hours.  Recent Results (from the past 240 hour(s))  SARS Coronavirus 2 by RT PCR (hospital order, performed in [facility] hospital lab) Nasopharyngeal Nasopharyngeal Swab     Status: None   Collection Time: 11/02/20  4:17 PM   Specimen: Nasopharyngeal Swab  Result Value Ref Range Status   SARS Coronavirus 2 NEGATIVE NEGATIVE Final    Comment: (NOTE) SARS-CoV-2 target nucleic acids are NOT DETECTED.  The SARS-CoV-2 RNA is generally detectable in upper and lower respiratory specimens during  the acute phase of infection. The lowest concentration of SARS-CoV-2 viral copies this assay can detect is 250 copies / mL. A negative result does not preclude SARS-CoV-2 infection and should not be used as the sole basis for treatment or other patient management decisions.  A negative result may occur with improper specimen collection / handling, submission of specimen other than nasopharyngeal swab, presence of viral mutation(s) within the areas targeted by this assay, and inadequate number of viral copies (<250 copies / mL). A negative result must be combined with clinical observations, patient history, and epidemiological information.  Fact Sheet for Patients:   StrictlyIdeas.no  Fact Sheet for Healthcare Providers: BankingDealers.co.za  This test is not yet approved or  cleared by the [place] FDA and has been authorized for detection and/or diagnosis of SARS-CoV-2 by FDA under an Emergency Use Authorization (EUA).  This EUA will remain in effect (meaning this  test can be used) for the duration of the COVID-19 declaration under Section 564(b)(1) of the Act, 21 U.S.C. section 360bbb-3(b)(1), unless the authorization is terminated or revoked sooner.  Performed at [facility], [address], [place], [place] [address]     Radiology Studies: DG Chest 1 View  Result Date: 11/02/2020 CLINICAL DATA:  Right hip fracture.  Fell. EXAM: CHEST  1 VIEW COMPARISON:  07/26/2020 FINDINGS: Normal sized heart. Clear lungs with normal vascularity. Diffuse osteopenia superior migration of both humeral heads, compatible with chronic rotator cuff tears. IMPRESSION: No acute abnormality. Electronically Signed   By: Claudie Revering M.D.   On: 11/02/2020 16:39   DG HIP OPERATIVE UNILAT W OR W/O PELVIS RIGHT  Result Date: 11/02/2020 CLINICAL DATA:  Known right hip fracture EXAM: OPERATIVE RIGHT HIP WITH PELVIS COMPARISON:  11/02/2020 FLUOROSCOPY TIME:  Radiation Exposure Index (as provided by the fluoroscopic device): Not available If the device does not provide the exposure index: Fluoroscopy Time:  Not available Number of Acquired Images:  6 FINDINGS: Initial images again demonstrate intratrochanteric fracture on the right. Proximal medullary rod is placed with fixation screw traversing the femoral neck. Fracture fragments are in near anatomic alignment. IMPRESSION: ORIF of proximal right femoral fracture. Electronically Signed   By: Inez Catalina M.D.   On: 11/02/2020 21:43   DG HIP UNILAT WITH PELVIS 2-3 VIEWS RIGHT  Result Date: 11/02/2020 CLINICAL DATA:  Postop right hip fracture. EXAM: DG HIP (WITH OR WITHOUT PELVIS) 2-3V RIGHT COMPARISON:  Preoperative radiograph earlier today. FINDINGS: Intramedullary rod with trans trochanteric and distal screw fixation of intertrochanteric femur fracture. Fracture is in improved alignment compared to preoperative imaging. Recent postsurgical change includes air and edema in the soft tissues. Bilateral hip osteoarthritis. No  new fracture. IMPRESSION: Status post ORIF of right intertrochanteric femur fracture without immediate postoperative complication. Electronically Signed   By: Keith Rake M.D.   On: 11/02/2020 22:46   DG Hip Unilat W or Wo Pelvis 2-3 Views Right  Result Date: 11/02/2020 CLINICAL DATA:  Right hip pain. EXAM: DG HIP (WITH OR WITHOUT PELVIS) 2-3V RIGHT COMPARISON:  None. FINDINGS: Mildly comminuted right intertrochanteric fracture. No mild distraction of the fragments without significant angulation. Moderate femoral head and neck junction spur formation on the right, mild on the left. Lower lumbar spine degenerative changes. Diffuse osteopenia. IMPRESSION: 1. Mildly comminuted right intertrochanteric fracture. 2. Mild to moderate right hip degenerative changes. Electronically Signed   By: Claudie Revering M.D.   On: 11/02/2020 16:38   Scheduled Meds: . amLODipine  5 mg Oral Daily  .  enoxaparin (LOVENOX) injection  30 mg Subcutaneous Q24H  . levETIRAcetam  500 mg Oral BID  . pantoprazole  40 mg Oral Daily  . rosuvastatin  20 mg Oral Daily   Continuous Infusions: . ceFAZolin    .  ceFAZolin (ANCEF) IV 1 g (11/03/20 1300)     LOS: 1 day    Time spent: 35 mins.    Shawna Clamp, MD [facility]   If 7PM-7AM, please contact night-coverage

## 2020-11-03 NOTE — Evaluation (Addendum)
Occupational Therapy Evaluation Patient Details Name: Justin Ford MRN: [id number] DOB: April 27, 1930 Today's Date: 11/03/2020    History of Present Illness Justin Ford is a 87 y.o. male with medical history significant of hypertension, hyperlipidemia, GERD, chronic kidney disease stage III basal cell carcinoma, previous GI bleed, kidney stones and previous TIAs, who presented with right hip pain after fall, now s/p right hip closed reduction, cephalomedullary nail placement (1/29)   Clinical Impression   Pt seen for OT evaluation on this date. Upon arrival to room pt awake, seated upright in bed. Pt reported he was independent with ADLs and all mobility at baseline, and denies any falls prior to most recent fall. Pt lives with his wife on the first floor of his daughter's 2-story home. Of note, pt verbalized that he does not think his family would be able to provide the level of assistance he anticipates requiring.   Pt reported no pain at rest, however verbalized concern mobilizing R LE d/t anticipation of pain. Pt agreeable to tx and OOB mobility. This session, pt required MAX A for LB dressing, MOD A for bed mobility, MOD Ax2 for sit>stand transfer, and MIN Ax2 for taking ~3-4 shuffled steps forward/backward with RW. Anticipate pt requiring MIN-MOD assist of 1-person in subsequent sessions with decreased pt anxiety regarding OOB mobility. Pt would benefit from additional skilled OT services to maximize return to PLOF and minimize risk of future falls, injury, caregiver burden, and readmission. Upon discharge, recommend SNF.      Follow Up Recommendations  SNF    Equipment Recommendations  Other (comment) (defer to next venue of care)       Precautions / Restrictions Restrictions Weight Bearing Restrictions: Yes RLE Weight Bearing: Weight bearing as tolerated      Mobility Bed Mobility Overal bed mobility: Needs Assistance Bed Mobility: Supine to Sit;Sit to Supine     Supine to  sit: Mod assist;HOB elevated Sit to supine: Mod assist;HOB elevated   General bed mobility comments: Unable to independently pull trunk forward with use of bedrails to place pillow behind back. During supine>sit transfer, required MOD A for trunk management. During sit>supine transfer, required MOD A for LE management    Transfers Overall transfer level: Needs assistance Equipment used: Rolling walker (2 wheeled) Transfers: Sit to/from Stand Sit to Stand: Mod assist;+2 physical assistance         General transfer comment: +2 for physical assistance for sit<>stand transfer d/t pt's anticipated pain with initial OOB transfer    Balance Overall balance assessment: Needs assistance Sitting-balance support: Bilateral upper extremity supported;Feet supported Sitting balance-Leahy Scale: Good Sitting balance - Comments: sitting EOB     Standing balance-Leahy Scale: Poor Standing balance comment: Marching x10, with UE reliance on RW.                           ADL either performed or assessed with clinical judgement   ADL Overall ADL's : Needs assistance/impaired                     Lower Body Dressing: Maximal assistance;Bed level Lower Body Dressing Details (indicate cue type and reason): Able to doff L sock independently, however required assistance to complete task             Functional mobility during ADLs: Minimal assistance;+2 for physical assistance;Rolling walker General ADL Comments: Pt able to take 4 small shuffled steps forward/backward with RW and MIN A+2  Pertinent Vitals/Pain Pain Assessment: No/denies pain (at rest)     Hand Dominance Right   Extremity/Trunk Assessment Upper Extremity Assessment Upper Extremity Assessment: Generalized weakness   Lower Extremity Assessment Lower Extremity Assessment: Defer to PT evaluation       Communication Communication Communication: No difficulties   Cognition  Arousal/Alertness: Awake/alert Behavior During Therapy: WFL for tasks assessed/performed Overall Cognitive Status: Within Functional Limits for tasks assessed                                 General Comments: Agreeable to session, however hesitant to participate in OOB mobility d/t anticipated pain with movement. Agreeable to OOB mobility following encouragement      Exercises General Exercises - Lower Extremity Hip Flexion/Marching: AROM;Strengthening;Both;10 reps;Standing        Home Living Family/patient expects to be discharged to:: Skilled nursing facility Living Arrangements: Spouse/significant other;Children Available Help at Discharge: Available PRN/intermittently (Daughter works in [place] and wife is unable to provide assistance) Type of Home: Estate agent (Daughter's house) Home Access: Stairs to enter Technical brewer of Steps: 3 Entrance Stairs-Rails: None Home Layout: Two level;Able to live on main level with bedroom/bathroom     Bathroom Shower/Tub: Occupational psychologist: Standard     Home Equipment: Tub bench          Prior Functioning/Environment Level of Independence: Independent        Comments: Pt reports he is independent with ADL/IADL management and all mobility at baseline. Denies any falls prior to most recent fall        OT Problem List: Decreased strength;Decreased range of motion;Decreased activity tolerance;Impaired balance (sitting and/or standing);Decreased knowledge of precautions;Pain      OT Treatment/Interventions: Self-care/ADL training;Therapeutic exercise;Energy conservation;Balance training;Therapeutic activities;Patient/family education;DME and/or AE instruction    OT Goals(Current goals can be found in the care plan section) Acute Rehab OT Goals Patient Stated Goal: to get stronger before going home OT Goal Formulation: With patient Time For Goal Achievement: 11/17/20 Potential to Achieve Goals:  Good ADL Goals Pt Will Perform Grooming: with min guard assist;standing Pt Will Perform Lower Body Dressing: with min assist;sit to/from stand;with adaptive equipment Pt Will Perform Toileting - Clothing Manipulation and hygiene: with min guard assist;sit to/from stand  OT Frequency: Min 2X/week   Barriers to D/C: Decreased caregiver support             AM-PAC OT "6 Clicks" Daily Activity     Outcome Measure Help from another person eating meals?: None Help from another person taking care of personal grooming?: A Little Help from another person toileting, which includes using toliet, bedpan, or urinal?: A Lot Help from another person bathing (including washing, rinsing, drying)?: A Lot Help from another person to put on and taking off regular upper body clothing?: A Little Help from another person to put on and taking off regular lower body clothing?: A Lot 6 Click Score: 16   End of Session Equipment Utilized During Treatment: Gait belt;Rolling walker Nurse Communication: Mobility status  Activity Tolerance: Patient tolerated treatment well Patient left: in bed;with call bell/phone within reach;with bed alarm set  OT Visit Diagnosis: Unsteadiness on feet (R26.81);History of falling (Z91.81)                Time: 7353-2992 OT Time Calculation (min): 35 min Charges:  OT General Charges $OT Visit: 1 Visit OT Evaluation $OT Eval Moderate Complexity: 1 Mod OT  Treatments $Therapeutic Activity: 23-37 mins  Fredirick Maudlin, OTR/L [place]

## 2020-11-03 NOTE — Evaluation (Signed)
Physical Therapy Evaluation Patient Details Name: Justin Ford MRN: [id number] DOB: 03-Mar-1930 Today's Date: 11/03/2020   History of Present Illness  Pt is an 87 y.o. male presenting to hospital 1/29 with R hip pain after fall.  Imaging showing mildly comminuted R intertrochanteric fx.  S/p R IMN intertrochantric 1/29.  PMH includes htn, HLD, TIA,  Barrett's esophagus, seizures.  Clinical Impression  Prior to hospital admission, pt was independent with ambulation; lives with his wife on 1st floor of daughter's 2 level home.  Currently pt is mod assist semi-supine to sitting edge of bed; 2 assist with transfers; and 2 assist to ambulate 16 feet with RW.  Vc's required for transfer and gait technique and walker use during sessions activities.  Pt reporting no pain at rest beginning/end of session but pt appearing with R hip/thigh pain during functional mobility.  Pt would benefit from skilled PT to address noted impairments and functional limitations (see below for any additional details).  Upon hospital discharge, pt would benefit from STR.    Follow Up Recommendations SNF    Equipment Recommendations  Rolling walker with 5" wheels;3in1 (PT)    Recommendations for Other Services OT consult     Precautions / Restrictions Precautions Precautions: Fall Precaution Comments: Seizure precautions Restrictions Weight Bearing Restrictions: Yes RLE Weight Bearing: Weight bearing as tolerated      Mobility  Bed Mobility Overal bed mobility: Needs Assistance Bed Mobility: Supine to Sit     Supine to sit: Mod assist;HOB elevated     General bed mobility comments: assist for R LE and trunk; vc's for technique required; increased time/effort to perform    Transfers Overall transfer level: Needs assistance Equipment used: Rolling walker (2 wheeled) Transfers: Sit to/from [facility] Sit to Stand: Min assist;Mod assist;+2 physical assistance Stand pivot transfers: Min  guard;Min assist;+2 physical assistance (stand step turn bed to recliner with RW)       General transfer comment: min to mod assist x2 to stand 1st trial from bed and min assist x2 to stand 2nd trial from bed; vc's for UE/LE placement and overall technique required  Ambulation/Gait Ambulation/Gait assistance: Min assist;Min guard;Mod assist;+2 physical assistance Gait Distance (Feet): 16 Feet Assistive device: Rolling walker (2 wheeled)   Gait velocity: decreased   General Gait Details: antalgic; decreased stance time R LE; vc's for increasing UE support through RW to offweight R LE during L LE advancement; R knee mildly buckling 1x requiring min to mod assist x2 but otherwise pt CGA to min assist x2; decreased B LE step length  Stairs            Wheelchair Mobility    Modified Rankin (Stroke Patients Only)       Balance Overall balance assessment: Needs assistance Sitting-balance support: No upper extremity supported;Feet supported Sitting balance-Leahy Scale: Good Sitting balance - Comments: steady sitting reaching within BOS   Standing balance support: Bilateral upper extremity supported;During functional activity Standing balance-Leahy Scale: Fair Standing balance comment: pt requiring B UE support on RW for standing balance                             Pertinent Vitals/Pain Pain Assessment: Faces Faces Pain Scale: No hurt (0/10 at rest; 4/10 with activity) Pain Descriptors / Indicators: Sore;Tender Pain Intervention(s): Limited activity within patient's tolerance;Monitored during session;Repositioned;Ice applied  Vitals (HR and O2 on room air) stable and WFL throughout treatment session.  Home Living Family/patient expects to be discharged to:: Skilled nursing facility Living Arrangements: Spouse/significant other;Children (pt's wife and daughter) Available Help at Discharge: Available PRN/intermittently (Daughter works in [place] and pt's wife  unable to assist) Type of Home: House (Daughter's house) Home Access: Stairs to enter   Technical brewer of Steps: 3 with B railings plus 2 more steps (no railings) Home Layout: Two level;Able to live on main level with bedroom/bathroom Home Equipment: Tub bench      Prior Function Level of Independence: Independent         Comments: Pt reports no other recent falls     Hand Dominance   Dominant Hand: Right    Extremity/Trunk Assessment   Upper Extremity Assessment Upper Extremity Assessment: Generalized weakness    Lower Extremity Assessment Lower Extremity Assessment: RLE deficits/detail (L LE WFL) RLE Deficits / Details: hip flexion at least 2+/5 (limited d/t R hip/thigh pain); at least 3/5 AROM knee flexion/extension and DF/PF RLE: Unable to fully assess due to pain    Cervical / Trunk Assessment Cervical / Trunk Assessment: Normal  Communication   Communication: HOH  Cognition Arousal/Alertness: Awake/alert Behavior During Therapy: WFL for tasks assessed/performed Overall Cognitive Status: Within Functional Limits for tasks assessed                                        General Comments General comments (skin integrity, edema, etc.): no drainage noted (unable to see through dressings).  Nursing cleared pt for participation in physical therapy.  Pt agreeable to PT session.    Exercises Total Joint Exercises Ankle Circles/Pumps: AROM;Strengthening;Both;10 reps;Supine Quad Sets: AROM;Strengthening;Both;10 reps;Supine Short Arc Quad: AROM;Strengthening;Right;10 reps;Supine Heel Slides: AAROM;Strengthening;Right;10 reps;Supine Hip ABduction/ADduction: AAROM;Strengthening;Right;10 reps;Supine   Assessment/Plan    PT Assessment Patient needs continued PT services  PT Problem List Decreased strength;Decreased activity tolerance;Decreased balance;Decreased mobility;Decreased knowledge of use of DME;Decreased knowledge of  precautions;Pain;Decreased skin integrity       PT Treatment Interventions DME instruction;Gait training;Stair training;Functional mobility training;Therapeutic activities;Therapeutic exercise;Balance training;Patient/family education    PT Goals (Current goals can be found in the Care Plan section)  Acute Rehab PT Goals Patient Stated Goal: to get stronger at rehab before going home PT Goal Formulation: With patient Time For Goal Achievement: 11/17/20 Potential to Achieve Goals: Good    Frequency BID   Barriers to discharge Decreased caregiver support      Co-evaluation               AM-PAC PT "6 Clicks" Mobility  Outcome Measure Help needed turning from your back to your side while in a flat bed without using bedrails?: None Help needed moving from lying on your back to sitting on the side of a flat bed without using bedrails?: A Lot Help needed moving to and from a bed to a chair (including a wheelchair)?: Total Help needed standing up from a chair using your arms (e.g., wheelchair or bedside chair)?: Total Help needed to walk in hospital room?: Total Help needed climbing 3-5 steps with a railing? : Total 6 Click Score: 10    End of Session Equipment Utilized During Treatment: Gait belt Activity Tolerance: Patient tolerated treatment well Patient left: in chair;with call bell/phone within reach (nurse positioned in site of pt; pt educated to call and wait for assist prior to getting out of recliner) Nurse Communication: Mobility status;Precautions;Weight bearing status PT Visit Diagnosis: Other abnormalities  of gait and mobility (R26.89);Muscle weakness (generalized) (M62.81);History of falling (Z91.81);Difficulty in walking, not elsewhere classified (R26.2);Pain Pain - Right/Left: Right Pain - part of body: Hip    Time: 1025-4862 PT Time Calculation (min) (ACUTE ONLY): 32 min   Charges:   PT Evaluation $PT Eval Low Complexity: 1 Low PT Treatments $Therapeutic  Exercise: 8-22 mins $Therapeutic Activity: 8-22 mins       Leitha Bleak, PT 11/03/20, 5:30 PM

## 2020-11-03 NOTE — Progress Notes (Signed)
PT rounding to evaluate. Assisted pt OOB w/walker. Up to recliner. Tolerated well.

## 2020-11-03 NOTE — NC FL2 (Signed)
[place] LEVEL OF CARE SCREENING TOOL     IDENTIFICATION  Patient Name: Justin Ford Birthdate: [birth year] Sex: male Admission Date (Current Location): 11/02/2020  [place] and [place] Number:  Engineering geologist and Address:  [facility], [address] [place], [place] [address]      Provider Number: [id number]  Attending Physician Name and Address:  Shawna Clamp, MD  Relative Name and Phone Number:  Darliss Cheney, daughter, [phone]    Current Level of Care: Hospital Recommended Level of Care: [place] Prior Approval Number:    Date Approved/Denied:   PASRR Number: [id number]  Discharge Plan: SNF    Current Diagnoses: Patient Active Problem List   Diagnosis Date Noted  . Closed right hip fracture, initial encounter ([place]) 11/02/2020  . Epilepsy undetermined as to focal or generalized ([place]) 11/02/2020  . Insomnia 08/07/2020  . Dysarthria 08/07/2020  . Gait abnormality 05/15/2020  . Angiodysplasia of intestinal tract   . Polyp of colon   . Melena   . Angiodysplasia of stomach   . Right foot drop 02/28/2017  . Left leg swelling 10/30/2016  . Gout 10/11/2016  . B12 deficiency 05/21/2016  . Bruising 05/21/2016  . Tremor 03/08/2016  . Hamstring strain 11/05/2015  . Multiple falls 11/05/2015  . CVA (cerebral infarction) 09/23/2015  . Lower back pain 09/12/2015  . Shoulder pain 09/12/2015  . 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 TIA (transient ischemic attack) 06/21/2015  . Hyperglycemia 08/09/2013  . Greater trochanteric bursitis 08/09/2013  . Trigger finger 06/21/2012  . Advance directive on file 03/22/2012  . Medicare annual wellness visit, subsequent 03/15/2012  . Dupuytren contracture 03/15/2012  . Cough 12/11/2011  . Barrett esophagus 10/12/2011  . Chronic kidney disease, stage III (moderate) ([place]) 10/12/2011  . HTN (hypertension) 09/27/2011  . HLD (hyperlipidemia)  09/27/2011  . GERD (gastroesophageal reflux disease) 09/27/2011    Orientation RESPIRATION BLADDER Height & Weight     Self,Time,Situation,Place  Normal (Room service appropriate? Yes  Fluid consistency: Thin) Continent Weight: 160 lb (72.6 kg) Height:  5\' 7"  (170.2 cm)  BEHAVIORAL SYMPTOMS/MOOD NEUROLOGICAL BOWEL NUTRITION STATUS      Continent Diet  AMBULATORY STATUS COMMUNICATION OF NEEDS Skin   Limited Assist Verbally Normal                       Personal Care Assistance Level of Assistance  Bathing,Feeding,Dressing,Total care Bathing Assistance: Limited assistance Feeding assistance: Independent Dressing Assistance: Limited assistance Total Care Assistance: Limited assistance   Functional Limitations Info  Sight,Hearing,Speech Sight Info: Adequate Hearing Info: Adequate Speech Info: Adequate    SPECIAL CARE FACTORS FREQUENCY                       Contractures      Additional Factors Info  Code Status,Allergies Code Status Info: Full Code Allergies Info: Lisinopril, Lipitor (Atorvastatin)           Current Medications (11/03/2020):  This is the current hospital active medication list Current Facility-Administered Medications  Medication Dose Route Frequency Provider Last Rate Last Admin  . amLODipine (NORVASC) tablet 5 mg  5 mg Oral Daily Cox, Amy N, DO   5 mg at 11/03/20 1127  . ceFAZolin (ANCEF) 1-4 GM/50ML-% IVPB           . ceFAZolin (ANCEF) IVPB 1 g/50 mL premix  1 g Intravenous Q8H Renda Rolls, RPH 100 mL/hr at 11/03/20  1300 1 g at 11/03/20 1300  . enoxaparin (LOVENOX) injection 30 mg  30 mg Subcutaneous Q24H Cox, Amy N, DO      . HYDROcodone-acetaminophen (NORCO/VICODIN) 5-325 MG per tablet 1-2 tablet  1-2 tablet Oral Q6H PRN Cox, Amy N, DO      . levETIRAcetam (KEPPRA) tablet 500 mg  500 mg Oral BID Cox, Amy N, DO   500 mg at 11/03/20 1126  . LORazepam (ATIVAN) injection 2 mg  2 mg Intravenous PRN Cox, Amy N, DO      . morphine 2 MG/ML  injection 2 mg  2 mg Intravenous Q2H PRN Cox, Amy N, DO   2 mg at 11/03/20 0030  . ondansetron (ZOFRAN) injection 4 mg  4 mg Intravenous Q6H PRN Cox, Amy N, DO      . pantoprazole (PROTONIX) EC tablet 40 mg  40 mg Oral Daily Cox, Amy N, DO   40 mg at 11/03/20 1128  . rosuvastatin (CRESTOR) tablet 20 mg  20 mg Oral Daily Cox, Amy N, DO   20 mg at 11/03/20 1127     Discharge Medications: Please see discharge summary for a list of discharge medications.  Relevant Imaging Results:  Relevant Lab Results:   Additional Information SS# [id number]  Berenice Bouton, LCSW

## 2020-11-03 NOTE — Anesthesia Postprocedure Evaluation (Signed)
Anesthesia Post Note  Patient: Justin Ford  Procedure(s) Performed: INTRAMEDULLARY (IM) NAIL INTERTROCHANTRIC (Right )  Patient location during evaluation: PACU Anesthesia Type: General Level of consciousness: awake and alert Pain management: pain level controlled Vital Signs Assessment: post-procedure vital signs reviewed and stable Respiratory status: spontaneous breathing, nonlabored ventilation, respiratory function stable and patient connected to nasal cannula oxygen Cardiovascular status: blood pressure returned to baseline and stable Postop Assessment: no apparent nausea or vomiting Anesthetic complications: no   No complications documented.   Last Vitals:  Vitals:   11/03/20 0248 11/03/20 0351  BP: 119/68 (!) 145/91  Pulse: (!) 57 81  Resp: 16 19  Temp:  36.7 C  SpO2: 99% 97%    Last Pain:  Vitals:   11/03/20 0351  TempSrc: Temporal  PainSc: 0-No pain                 Precious Haws Piscitello

## 2020-11-04 ENCOUNTER — Encounter: Payer: Self-pay | Admitting: Orthopaedic Surgery

## 2020-11-04 DIAGNOSIS — M6281 Muscle weakness (generalized): Secondary | ICD-10-CM | POA: Insufficient documentation

## 2020-11-04 DIAGNOSIS — Z741 Need for assistance with personal care: Secondary | ICD-10-CM | POA: Diagnosis not present

## 2020-11-04 DIAGNOSIS — Z743 Need for continuous supervision: Secondary | ICD-10-CM | POA: Diagnosis not present

## 2020-11-04 DIAGNOSIS — R2681 Unsteadiness on feet: Secondary | ICD-10-CM | POA: Diagnosis not present

## 2020-11-04 DIAGNOSIS — M7542 Impingement syndrome of left shoulder: Secondary | ICD-10-CM | POA: Diagnosis not present

## 2020-11-04 DIAGNOSIS — K219 Gastro-esophageal reflux disease without esophagitis: Secondary | ICD-10-CM | POA: Diagnosis not present

## 2020-11-04 DIAGNOSIS — M25551 Pain in right hip: Secondary | ICD-10-CM | POA: Diagnosis not present

## 2020-11-04 DIAGNOSIS — F330 Major depressive disorder, recurrent, mild: Secondary | ICD-10-CM | POA: Diagnosis not present

## 2020-11-04 DIAGNOSIS — S72141D Displaced intertrochanteric fracture of right femur, subsequent encounter for closed fracture with routine healing: Secondary | ICD-10-CM | POA: Insufficient documentation

## 2020-11-04 DIAGNOSIS — M25572 Pain in left ankle and joints of left foot: Secondary | ICD-10-CM | POA: Diagnosis not present

## 2020-11-04 DIAGNOSIS — R262 Difficulty in walking, not elsewhere classified: Secondary | ICD-10-CM | POA: Diagnosis not present

## 2020-11-04 DIAGNOSIS — S72001A Fracture of unspecified part of neck of right femur, initial encounter for closed fracture: Secondary | ICD-10-CM | POA: Diagnosis not present

## 2020-11-04 DIAGNOSIS — R52 Pain, unspecified: Secondary | ICD-10-CM | POA: Diagnosis not present

## 2020-11-04 DIAGNOSIS — E785 Hyperlipidemia, unspecified: Secondary | ICD-10-CM | POA: Diagnosis not present

## 2020-11-04 DIAGNOSIS — I10 Essential (primary) hypertension: Secondary | ICD-10-CM | POA: Diagnosis not present

## 2020-11-04 DIAGNOSIS — R278 Other lack of coordination: Secondary | ICD-10-CM | POA: Diagnosis not present

## 2020-11-04 DIAGNOSIS — Z8781 Personal history of (healed) traumatic fracture: Secondary | ICD-10-CM | POA: Diagnosis not present

## 2020-11-04 DIAGNOSIS — D519 Vitamin B12 deficiency anemia, unspecified: Secondary | ICD-10-CM | POA: Diagnosis not present

## 2020-11-04 DIAGNOSIS — R279 Unspecified lack of coordination: Secondary | ICD-10-CM | POA: Diagnosis not present

## 2020-11-04 DIAGNOSIS — F339 Major depressive disorder, recurrent, unspecified: Secondary | ICD-10-CM | POA: Insufficient documentation

## 2020-11-04 DIAGNOSIS — M7541 Impingement syndrome of right shoulder: Secondary | ICD-10-CM | POA: Diagnosis not present

## 2020-11-04 DIAGNOSIS — S7291XA Unspecified fracture of right femur, initial encounter for closed fracture: Secondary | ICD-10-CM | POA: Diagnosis not present

## 2020-11-04 DIAGNOSIS — F3341 Major depressive disorder, recurrent, in partial remission: Secondary | ICD-10-CM | POA: Diagnosis not present

## 2020-11-04 LAB — PHOSPHORUS: Phosphorus: 2.8 mg/dL (ref 2.5–4.6)

## 2020-11-04 LAB — CBC
HCT: 27.6 % — ABNORMAL LOW (ref 39.0–52.0)
Hemoglobin: 9.3 g/dL — ABNORMAL LOW (ref 13.0–17.0)
MCH: 28.4 pg (ref 26.0–34.0)
MCHC: 33.7 g/dL (ref 30.0–36.0)
MCV: 84.1 fL (ref 80.0–100.0)
Platelets: 123 10*3/uL — ABNORMAL LOW (ref 150–400)
RBC: 3.28 MIL/uL — ABNORMAL LOW (ref 4.22–5.81)
RDW: 14.3 % (ref 11.5–15.5)
WBC: 7.4 10*3/uL (ref 4.0–10.5)
nRBC: 0 % (ref 0.0–0.2)

## 2020-11-04 LAB — BASIC METABOLIC PANEL
Anion gap: 6 (ref 5–15)
BUN: 24 mg/dL — ABNORMAL HIGH (ref 8–23)
CO2: 24 mmol/L (ref 22–32)
Calcium: 8.6 mg/dL — ABNORMAL LOW (ref 8.9–10.3)
Chloride: 109 mmol/L (ref 98–111)
Creatinine, Ser: 2.06 mg/dL — ABNORMAL HIGH (ref 0.61–1.24)
GFR, Estimated: 30 mL/min — ABNORMAL LOW (ref >60)
Glucose, Bld: 121 mg/dL — ABNORMAL HIGH (ref 70–99)
Potassium: 3.8 mmol/L (ref 3.5–5.1)
Sodium: 139 mmol/L (ref 135–145)

## 2020-11-04 LAB — MAGNESIUM: Magnesium: 1.5 mg/dL — ABNORMAL LOW (ref 1.7–2.4)

## 2020-11-04 MED ORDER — MAGNESIUM SULFATE 2 GM/50ML IV SOLN
2.0000 g | Freq: Once | INTRAVENOUS | Status: AC
  Administered 2020-11-04: 2 g via INTRAVENOUS
  Filled 2020-11-04: qty 50

## 2020-11-04 MED ORDER — SODIUM CHLORIDE 0.9 % IV BOLUS
500.0000 mL | Freq: Once | INTRAVENOUS | Status: AC
  Administered 2020-11-04: 500 mL via INTRAVENOUS

## 2020-11-04 MED ORDER — ACETAMINOPHEN 325 MG PO TABS
ORAL_TABLET | ORAL | Status: AC
  Administered 2020-11-04: 650 mg via ORAL
  Filled 2020-11-04: qty 2

## 2020-11-04 MED ORDER — ACETAMINOPHEN 325 MG PO TABS: 650.0000 mg | ORAL_TABLET | Freq: Once | ORAL | Status: AC

## 2020-11-04 MED ORDER — ENOXAPARIN SODIUM 30 MG/0.3ML ~~LOC~~ SOLN: 30.0000 mg | SUBCUTANEOUS | 42 refills | Status: DC

## 2020-11-04 NOTE — Progress Notes (Signed)
Physical Therapy Treatment Patient Details Name: Justin Ford MRN: [id number] DOB: [birth year] Today's Date: 11/04/2020    History of Present Illness Pt is an 87 y.o. male presenting to hospital 1/29 with R hip pain after fall.  Imaging showing mildly comminuted R intertrochanteric fx.  S/p R IMN intertrochantric 1/29.  PMH includes htn, HLD, TIA,  Barrett's esophagus, seizures.    PT Comments    Pt resting in bed upon PT arrival; agreeable to PT session.  Pt reporting minimal pain R hip at rest beginning/end of session but increased with activity.  Mod assist semi-supine to sitting edge of bed; mod to max assist to stand from bed up to RW; and min assist to ambulate 22 feet with RW.  Limited distance ambulating d/t fatigue and R hip/thigh pain.  Vc's required for transfer and gait technique during session.  Will continue to progress pt with strengthening and progressive functional mobility per pt tolerance.    Follow Up Recommendations  SNF     Equipment Recommendations  Rolling walker with 5" wheels;3in1 (PT)    Recommendations for Other Services OT consult     Precautions / Restrictions Precautions Precautions: Fall Precaution Comments: Seizure precautions Restrictions Weight Bearing Restrictions: Yes RLE Weight Bearing: Weight bearing as tolerated    Mobility  Bed Mobility Overal bed mobility: Needs Assistance Bed Mobility: Supine to Sit     Supine to sit: Mod assist;HOB elevated     General bed mobility comments: assist for R LE and trunk; vc's for technique required; increased time/effort to perform  Transfers Overall transfer level: Needs assistance Equipment used: Rolling walker (2 wheeled) Transfers: Sit to/from Stand Sit to Stand: Mod assist;Max assist         General transfer comment: vc's for UE/LE placement; assist to initiate and come to full stand; assist to control descent sitting  Ambulation/Gait Ambulation/Gait assistance: Min assist Gait  Distance (Feet): 22 Feet Assistive device: Rolling walker (2 wheeled)   Gait velocity: decreased   General Gait Details: antalgic; decreased stance time R LE; vc's for increasing UE support through RW to offweight R LE during L LE advancement; decreased B LE step length and foot clearance   Stairs             Wheelchair Mobility    Modified Rankin (Stroke Patients Only)       Balance Overall balance assessment: Needs assistance Sitting-balance support: No upper extremity supported;Feet supported Sitting balance-Leahy Scale: Good Sitting balance - Comments: steady sitting reaching within BOS   Standing balance support: Bilateral upper extremity supported;During functional activity Standing balance-Leahy Scale: Fair Standing balance comment: pt requiring B UE support on RW for standing balance                            Cognition Arousal/Alertness: Awake/alert Behavior During Therapy: WFL for tasks assessed/performed Overall Cognitive Status: Within Functional Limits for tasks assessed                                        Exercises General Exercises - Lower Extremity Ankle Circles/Pumps: AROM;Strengthening;Both;10 reps;Supine Quad Sets: AROM;Strengthening;Both;10 reps;Supine Short Arc Quad: AROM;Strengthening;Right;10 reps;Supine Heel Slides: AAROM;Strengthening;Right;10 reps;Supine Hip ABduction/ADduction: AAROM;Strengthening;Right;10 reps;Supine    General Comments General comments (skin integrity, edema, etc.): no drainage noted (unable to see through dressings).  Nursing cleared pt for participation in physical therapy.  Pt agreeable to PT session.      Pertinent Vitals/Pain Pain Assessment: 0-10 Pain Score: 2  Pain Location: R hip/thigh Pain Descriptors / Indicators: Sore;Tender Pain Intervention(s): Limited activity within patient's tolerance;Monitored during session;Premedicated before session;Repositioned  Vitals (HR and O2  on room air) stable and WFL throughout treatment session.    Home Living                      Prior Function            PT Goals (current goals can now be found in the care plan section) Acute Rehab PT Goals Patient Stated Goal: to get stronger at rehab before going home PT Goal Formulation: With patient Time For Goal Achievement: 11/17/20 Potential to Achieve Goals: Good Progress towards PT goals: Progressing toward goals    Frequency    BID      PT Plan Current plan remains appropriate    Co-evaluation              AM-PAC PT "6 Clicks" Mobility   Outcome Measure  Help needed turning from your back to your side while in a flat bed without using bedrails?: None Help needed moving from lying on your back to sitting on the side of a flat bed without using bedrails?: A Lot Help needed moving to and from a bed to a chair (including a wheelchair)?: A Lot Help needed standing up from a chair using your arms (e.g., wheelchair or bedside chair)?: A Lot Help needed to walk in hospital room?: A Little Help needed climbing 3-5 steps with a railing? : Total 6 Click Score: 14    End of Session Equipment Utilized During Treatment: Gait belt Activity Tolerance: Patient tolerated treatment well Patient left: in chair;with call bell/phone within reach;with SCD's reapplied;Other (comment) (R LE elevated via pillow; B heels floating (R via pillow and L via towel roll)) Nurse Communication: Mobility status;Precautions;Weight bearing status PT Visit Diagnosis: Other abnormalities of gait and mobility (R26.89);Muscle weakness (generalized) (M62.81);History of falling (Z91.81);Difficulty in walking, not elsewhere classified (R26.2);Pain Pain - Right/Left: Right Pain - part of body: Hip     Time: 9030-0923 PT Time Calculation (min) (ACUTE ONLY): 33 min  Charges:  $Gait Training: 8-22 mins $Therapeutic Exercise: 8-22 mins                    Leitha Bleak, PT 11/04/20,  1:46 PM

## 2020-11-04 NOTE — Progress Notes (Signed)
Patient is lying in bed. He is irritable, refusing breakfast, fluids, and IV medicine until he is placed in a private room with a bathroom. I alerted the patient to the status of the hospital census and that when one becomes available we will transfer if possible but due to the high volume of patients it may not be an option.I alerted my AD, social worker,and attending physician to the status. Dr. Dwyane Dee states he will visit the patient and explain the situation. I will continue to monitor the patient.

## 2020-11-04 NOTE — Discharge Instructions (Signed)
Intramedullary Nailing of Hip Fracture, Care After This sheet gives you information about how to care for yourself after your procedure. Your health care provider may also give you more specific instructions. If you have problems or questions, contact your health care provider. What can I expect after the surgery? After the procedure, it is common to have these symptoms in your hip area:  Pain.  Swelling.  Tenderness.  Stiffness and weakness. Follow these instructions at home: Medicines  Take over-the-counter and prescription medicines only as told by your health care provider.  Ask your health care provider if the medicine prescribed to you: ? Requires you to avoid driving or using heavy machinery. ? Can cause constipation. You may need to take actions to prevent or treat constipation, such as:  Drink enough fluid to keep your urine pale yellow.  Take over-the-counter or prescription medicines.  Eat foods that are high in fiber, such as beans, whole grains, and fresh fruits and vegetables.  Limit foods that are high in fat and processed sugars, such as fried or sweet foods. Bathing  Do not take baths, swim, or use a hot tub until your health care provider approves. Ask your health care provider if you may take showers. You may only be allowed to take sponge baths.  Keep your bandage (dressing) dry if you shower or take a sponge bath. Incision care  Follow instructions from your health care provider about how to take care of your incision. Make sure you: ? Wash your hands with soap and water before and after you change your dressing. If soap and water are not available, use hand sanitizer. ? Change your dressing as told by your health care provider. ? Leave stitches (sutures), skin glue, or adhesive strips in place. These skin closures may need to stay in place for 2 weeks or longer. If adhesive strip edges start to loosen and curl up, you may trim the loose edges. Do not remove  adhesive strips completely unless your health care provider tells you to do that.  Check your incision area every day for signs of infection. Check for: ? More redness, swelling, or pain. ? Fluid or blood. ? Warmth. ? Pus or a bad smell.   Managing pain, stiffness, and swelling  If directed, put ice on the affected area. ? Put ice in a plastic bag. ? Place a towel between your skin and the bag. ? Leave the ice on for 20 minutes, 2-3 times a day.  Move your toes often to reduce stiffness and swelling.  Raise (elevate) the injured area above the level of your heart while you are lying down.   Activity  Rest as told by your health care provider.  Use your crutches or walker as told by your health care provider. Your health care provider will let you know how much weight you can put on your leg. Your health care provider will do X-rays to check bone healing. As healing progresses, you may be allowed to put more weight on your leg.  Avoid sitting for a long time without moving. Get up to take short walks every 1-2 hours. This is important to improve blood flow and breathing. Ask for help if you feel weak or unsteady.  Keep all your physical therapy appointments.  Do exercises as told by your health care provider. These exercises will prevent weakness and stiffness in your hip.  Return to your normal activities as told by your health care provider. Ask your health care  provider what activities are safe for you. It may take several months to heal completely.  Ask your health care provider when it is safe to drive. General instructions  Do not use any products that contain nicotine or tobacco, such as cigarettes, e-cigarettes, and chewing tobacco. These can delay bone healing after surgery. If you need help quitting, ask your health care provider.  Take steps to prevent falls at home, such as removing throw rugs and tripping hazards.  Keep all follow-up visits as told by your health  care provider. This is important. Contact a health care provider if:  You are not getting relief from your pain medicine.  You have more redness, swelling, or pain around your incision.  You have fluid or blood coming from your incision.  Your incision feels warm to the touch.  You have pus or a bad smell coming from your incision. Get help right away if:  You have a fever and chills.  You have chest pain or trouble breathing.  Your incision breaks open.  You have severe pain that does not get better with medicine. Summary  After your surgery, it is normal to have some pain, swelling, and tenderness.  Take over-the-counter and prescription medicines only as told by your health care provider.  Follow instructions from your health care provider about how to take care of your incision. Check your incision area every day for signs of infection.  Return to your normal activities as told by your health care provider. Ask your health care provider what activities are safe for you.  It may take several months to heal completely. Keep all follow-up visits as told by your health care provider. This information is not intended to replace advice given to you by your health care provider. Make sure you discuss any questions you have with your health care provider. Document Revised: 07/25/2018 Document Reviewed: 07/25/2018 Elsevier Patient Education  2021 [place] to follow-up with primary care physician in 1 week.   Advised to follow-up with orthopedics Dr. [name] on 11/18/20 @ 10:30 am  With Louisa Second, PA for wound check.   Advised to continue Lovenox 30 mg daily for 6 weeks postoperatively. Patient underwent open reduction internal fixation for hip fracture.

## 2020-11-04 NOTE — TOC Transition Note (Signed)
Transition of Care [facility]) - CM/SW Discharge Note   Patient Details  Name: DELANEY SCHNICK MRN: [id number] Date of Birth: Mar 02, 1930  [facility] CM/SW Contact:  Magnus Ivan, LCSW Phone Number: 11/04/2020, 12:36 PM   Clinical Narrative:   Patient to discharge to [facility] today, Room 104. Confirmed with Seth Bake at [facility]. CSW updated MD, RN, patient, and daughter Randal Buba. Asked RN to call report and MD to submit DC Summary. Medical Necessity Form, DNR, and Face Sheet placed in Discharge Packet by patient chart. RN prefers to call EMS when patient is ready (after DC Summary is in and report is called) No other needs identified prior to discharge.   Final next level of care: Skilled Nursing Facility Barriers to Discharge: Barriers Resolved   Patient Goals and CMS Choice Patient states their goals for this hospitalization and ongoing recovery are:: SNF rehab CMS Medicare.gov Compare Post Acute Care list provided to:: Patient Choice offered to / list presented to : [place]  Discharge Placement              Patient chooses bed at: [facility] Patient to be transferred to facility by: EMS Name of family member notified: Daphna - daughter Patient and family notified of of transfer: 11/04/20  Discharge Plan and Services                                     Social Determinants of Health (SDOH) Interventions     Readmission Risk Interventions No flowsheet data found.

## 2020-11-04 NOTE — Plan of Care (Signed)
Patient was transported to [facility] but his educational skills were adequate for discharge.

## 2020-11-04 NOTE — Discharge Summary (Addendum)
Physician Discharge Summary  Justin Ford:[id number] DOB: 10-01-1930 DOA: 11/02/2020  PCP: Justin Ghent, MD  Admit date: 11/02/2020   Discharge date: 11/04/2020  Admitted From:  Home.  Disposition:   SNF ( [facility])  Recommendations for Outpatient Follow-up:  1. Follow up with PCP in 1-2 weeks. 2. Please obtain BMP/CBC in one week. 3. Advised to follow-up with orthopedics Dr. Harlow Ford in 2 weeks for wound check.   4. Advised to continue Lovenox 30 mg Sq daily for 6 weeks postoperatively. 5. Patient underwent open reduction, internal fixation for hip fracture.  Home Health: None. Equipment/Devices: None.  Discharge Condition: Stable CODE STATUS:DNR Diet recommendation: Heart Healthy   Brief Summary/ Hospital Course: This 87 years old male with PMH significant for hypertension, hyperlipidemia, seizures (multifocal epilepsy) controlled on Keppra presents in the emergency department status post fall while walking down 3 steps of stairs down into the garage.  Patient reports he was carrying too many files and documents and fell.  He denies loss of consciousness and head injury.  He endorses landing on his right hip and has developed right hip pain.  X-ray hip showed comminuted right intertrochanteric fracture.  Orthopedics was consulted,  Patient underwent open reduction internal fixation.  Postoperative day 2,  tolerated well.  PT recommended skilled nursing facility for rehab.  Patient seems much improved,  He reports pain is better controlled.  Patient wants to be discharged.  Patient is cleared from orthopedics, advised to take Lovenox 30 mg subcu daily for 6 weeks postoperatively for DVT prophylaxis.  Patient will follow up with Dr. Harlow Ford in 2 weeks for wound check.  He was managed for below problems.  Discharge Diagnoses:  Principal Problem:   Closed right hip fracture, initial encounter ([place]) Active Problems:   HTN (hypertension)   HLD (hyperlipidemia)   Chronic kidney  disease, stage III (moderate) (HCC)   Insomnia   Epilepsy undetermined as to focal or generalized ([place])  Closedmildly comminuted right intertrochanteric fracture-secondary to mechanical fall Orthopedic surgeon has been consulted. Patient underwent right intramedullary nail placement.  Postoperative day 2. Patient tolerated procedure well, reports pain is better controlled. Pain management: Morphine 2 mg IV every 2 hours as needed for severe pain, hydrocodone 5 mg, 1 to 2 tablets p.o. every 6 hours as needed for moderate pain. Ortho recommended weightbearing as tolerated, antibiotics for 24 hours postop.  DVT prophylaxis for 6 weeks. If patient is mobile patient can have aspirin 325 mg daily and if patient is not mobile orthopedic surgeon recommends subcutaneous enoxaparin for 6 weeks for DVT prophylaxis. Cleared from orthopedic to be discharged to rehab.   Hypertension: Blood pressure remains slightly elevated because of the pain Resumed amlodipine 5 mg p.o. daily.  Hyperlipidemia Continue rosuvastatin 20 mg  Multifocal epilepsy seizures-controlled -Per daughter patient last had seizure in October 2021. -Keppra 550 mg p.o. twice daily. -Seizure precautions -Ativan 2 mg IV as needed for seizures, 2 doses ordered  Discharge Instructions  Discharge Instructions    Call MD for:  difficulty breathing, headache or visual disturbances   Complete by: As directed    Call MD for:  persistant dizziness or light-headedness   Complete by: As directed    Call MD for:  persistant nausea and vomiting   Complete by: As directed    Call MD for:  severe uncontrolled pain   Complete by: As directed    Call MD for:  temperature >100.4   Complete by: As directed  Diet - low sodium heart healthy   Complete by: As directed    Diet Carb Modified   Complete by: As directed    Discharge instructions   Complete by: As directed    Advised to follow-up with primary care physician in 1 week.   Advised to follow-up with orthopedics Dr. Harlow Ford in 2 weeks for wound check.  Advised to continue Lovenox 30 mg daily for 6 weeks postoperatively. Patient underwent open reduction internal fixation for hip fracture.   Discharge wound care:   Complete by: As directed    Follow-up Dr. Harlow Ford in 2 weeks   Increase activity slowly   Complete by: As directed      Allergies as of 11/04/2020      Reactions   Lisinopril Other (See Comments)   Elevated creatinine-caution on dosing.   Lipitor [atorvastatin] Other (See Comments)   Reaction:  Unknown       Medication List    TAKE these medications   amLODipine 5 MG tablet Commonly known as: NORVASC Take 1 tablet (5 mg total) by mouth daily.   aspirin EC 81 MG tablet Take 81 mg by mouth daily. Swallow whole.   cyanocobalamin 1000 MCG/ML injection Commonly known as: (VITAMIN B-12) 1069mcg IM monthly   enoxaparin 30 MG/0.3ML injection Commonly known as: LOVENOX Inject 0.3 mLs (30 mg total) into the skin daily.   levETIRAcetam 500 MG tablet Commonly known as: Keppra Take 1 tablet (500 mg total) by mouth 2 (two) times daily.   pantoprazole 40 MG tablet Commonly known as: PROTONIX Take 1 tablet (40 mg total) by mouth daily.   rosuvastatin 20 MG tablet Commonly known as: CRESTOR Take 1 tablet by mouth once daily            Discharge Care Instructions  (From admission, onward)         Start     Ordered   11/04/20 0000  Discharge wound care:       Comments: Follow-up Dr. Harlow Ford in 2 weeks   11/04/20 1137          Follow-up Information    Justin Ghent, MD Follow up in 1 week(s).   Specialty: Family Medicine Contact information: [place] [address] [phone]        Lovell Sheehan, MD Follow up in 2 week(s).   Specialty: Orthopedic Surgery Contact information: [place] [address] [phone]              Allergies  Allergen Reactions  . Lisinopril  Other (See Comments)    Elevated creatinine-caution on dosing.  . Lipitor [Atorvastatin] Other (See Comments)    Reaction:  Unknown     Consultations:  Orthopeadics   Procedures/Studies: DG Chest 1 View  Result Date: 11/02/2020 CLINICAL DATA:  Right hip fracture.  Fell. EXAM: CHEST  1 VIEW COMPARISON:  07/26/2020 FINDINGS: Normal sized heart. Clear lungs with normal vascularity. Diffuse osteopenia superior migration of both humeral heads, compatible with chronic rotator cuff tears. IMPRESSION: No acute abnormality. Electronically Signed   By: Claudie Revering M.D.   On: 11/02/2020 16:39   DG HIP OPERATIVE UNILAT W OR W/O PELVIS RIGHT  Result Date: 11/02/2020 CLINICAL DATA:  Known right hip fracture EXAM: OPERATIVE RIGHT HIP WITH PELVIS COMPARISON:  11/02/2020 FLUOROSCOPY TIME:  Radiation Exposure Index (as provided by the fluoroscopic device): Not available If the device does not provide the exposure index: Fluoroscopy Time:  Not available Number  of Acquired Images:  6 FINDINGS: Initial images again demonstrate intratrochanteric fracture on the right. Proximal medullary rod is placed with fixation screw traversing the femoral neck. Fracture fragments are in near anatomic alignment. IMPRESSION: ORIF of proximal right femoral fracture. Electronically Signed   By: Inez Catalina M.D.   On: 11/02/2020 21:43   DG HIP UNILAT WITH PELVIS 2-3 VIEWS RIGHT  Result Date: 11/02/2020 CLINICAL DATA:  Postop right hip fracture. EXAM: DG HIP (WITH OR WITHOUT PELVIS) 2-3V RIGHT COMPARISON:  Preoperative radiograph earlier today. FINDINGS: Intramedullary rod with trans trochanteric and distal screw fixation of intertrochanteric femur fracture. Fracture is in improved alignment compared to preoperative imaging. Recent postsurgical change includes air and edema in the soft tissues. Bilateral hip osteoarthritis. No new fracture. IMPRESSION: Status post ORIF of right intertrochanteric femur fracture without immediate  postoperative complication. Electronically Signed   By: Keith Rake M.D.   On: 11/02/2020 22:46   DG Hip Unilat W or Wo Pelvis 2-3 Views Right  Result Date: 11/02/2020 CLINICAL DATA:  Right hip pain. EXAM: DG HIP (WITH OR WITHOUT PELVIS) 2-3V RIGHT COMPARISON:  None. FINDINGS: Mildly comminuted right intertrochanteric fracture. No mild distraction of the fragments without significant angulation. Moderate femoral head and neck junction spur formation on the right, mild on the left. Lower lumbar spine degenerative changes. Diffuse osteopenia. IMPRESSION: 1. Mildly comminuted right intertrochanteric fracture. 2. Mild to moderate right hip degenerative changes. Electronically Signed   By: Claudie Revering M.D.   On: 11/02/2020 16:38    Open reduction internal fixation for right hip fracture.   Subjective: Patient was seen and examined at bedside.  Overnight events noted.  Patient reports pain is better controlled.   Patient has participated in physical therapy,  recommended skilled nursing facility.   Patient is being discharged to skilled nursing facility and wants to be discharged.  Discharge Exam: Vitals:   11/04/20 0800 11/04/20 1003  BP: 128/71 116/79  Pulse: 61   Resp: 17   Temp: (!) 96.9 F (36.1 C)   SpO2: 94%    Vitals:   11/04/20 0000 11/04/20 0400 11/04/20 0800 11/04/20 1003  BP: 106/72 124/76 128/71 116/79  Pulse: 61 (!) 57 61   Resp: 17 17 17    Temp: 98.4 F (36.9 C) 98 F (36.7 C) (!) 96.9 F (36.1 C)   TempSrc: Temporal Temporal Temporal   SpO2: 93% 94% 94%   Weight:      Height:        General: Pt is alert, awake, not in acute distress Cardiovascular: RRR, S1/S2 +, no rubs, no gallops Respiratory: CTA bilaterally, no wheezing, no rhonchi Abdominal: Soft, NT, ND, bowel sounds + Extremities: no edema, no cyanosis. Right Hip tendrerness.    The results of significant diagnostics from this hospitalization (including imaging, microbiology, ancillary and  laboratory) are listed below for reference.     Microbiology: Recent Results (from the past 240 hour(s))  SARS Coronavirus 2 by RT PCR (hospital order, performed in [facility] hospital lab) Nasopharyngeal Nasopharyngeal Swab     Status: None   Collection Time: 11/02/20  4:17 PM   Specimen: Nasopharyngeal Swab  Result Value Ref Range Status   SARS Coronavirus 2 NEGATIVE NEGATIVE Final    Comment: (NOTE) SARS-CoV-2 target nucleic acids are NOT DETECTED.  The SARS-CoV-2 RNA is generally detectable in upper and lower respiratory specimens during the acute phase of infection. The lowest concentration of SARS-CoV-2 viral copies this assay can detect is 250 copies / mL.  A negative result does not preclude SARS-CoV-2 infection and should not be used as the sole basis for treatment or other patient management decisions.  A negative result may occur with improper specimen collection / handling, submission of specimen other than nasopharyngeal swab, presence of viral mutation(s) within the areas targeted by this assay, and inadequate number of viral copies (<250 copies / mL). A negative result must be combined with clinical observations, patient history, and epidemiological information.  Fact Sheet for Patients:   StrictlyIdeas.no  Fact Sheet for Healthcare Providers: BankingDealers.co.za  This test is not yet approved or  cleared by the [place] FDA and has been authorized for detection and/or diagnosis of SARS-CoV-2 by FDA under an Emergency Use Authorization (EUA).  This EUA will remain in effect (meaning this test can be used) for the duration of the COVID-19 declaration under Section 564(b)(1) of the Act, 21 U.S.C. section 360bbb-3(b)(1), unless the authorization is terminated or revoked sooner.  Performed at [facility], [address] [place], [place] [address]      Labs: BNP (last 3 results) No results for  input(s): BNP in the last 8760 hours. Basic Metabolic Panel: Recent Labs  Lab 11/02/20 1616 11/04/20 0703  NA 140 139  K 3.9 3.8  CL 107 109  CO2 24 24  GLUCOSE 109* 121*  BUN 20 24*  CREATININE 1.71* 2.06*  CALCIUM 9.2 8.6*  MG  --  1.5*  PHOS  --  2.8   Liver Function Tests: Recent Labs  Lab 11/02/20 1616  AST 28  ALT 19  ALKPHOS 89  BILITOT 0.8  PROT 8.1  ALBUMIN 4.0   No results for input(s): LIPASE, AMYLASE in the last 168 hours. No results for input(s): AMMONIA in the last 168 hours. CBC: Recent Labs  Lab 11/02/20 1616 11/03/20 0838 11/04/20 0703  WBC 5.4 8.7 7.4  NEUTROABS 4.0  --   --   HGB 13.4 11.0* 9.3*  HCT 39.4 32.1* 27.6*  MCV 83.8 83.4 84.1  PLT 153 150 123*   Cardiac Enzymes: No results for input(s): CKTOTAL, CKMB, CKMBINDEX, TROPONINI in the last 168 hours. BNP: Invalid input(s): POCBNP CBG: No results for input(s): GLUCAP in the last 168 hours. D-Dimer No results for input(s): DDIMER in the last 72 hours. Hgb A1c No results for input(s): HGBA1C in the last 72 hours. Lipid Profile No results for input(s): CHOL, HDL, LDLCALC, TRIG, CHOLHDL, LDLDIRECT in the last 72 hours. Thyroid function studies No results for input(s): TSH, T4TOTAL, T3FREE, THYROIDAB in the last 72 hours.  Invalid input(s): FREET3 Anemia work up No results for input(s): VITAMINB12, FOLATE, FERRITIN, TIBC, IRON, RETICCTPCT in the last 72 hours. Urinalysis No results found for: COLORURINE, APPEARANCEUR, [place], [place], [place], [place], [place], [place], PROTEINUR, UROBILINOGEN, NITRITE, LEUKOCYTESUR Sepsis Labs Invalid input(s): PROCALCITONIN,  WBC,  LACTICIDVEN Microbiology Recent Results (from the past 240 hour(s))  SARS Coronavirus 2 by RT PCR (hospital order, performed in [facility] hospital lab) Nasopharyngeal Nasopharyngeal Swab     Status: None   Collection Time: 11/02/20  4:17 PM   Specimen: Nasopharyngeal Swab  Result Value Ref Range Status    SARS Coronavirus 2 NEGATIVE NEGATIVE Final    Comment: (NOTE) SARS-CoV-2 target nucleic acids are NOT DETECTED.  The SARS-CoV-2 RNA is generally detectable in upper and lower respiratory specimens during the acute phase of infection. The lowest concentration of SARS-CoV-2 viral copies this assay can detect is 250 copies / mL. A negative result does not preclude SARS-CoV-2  infection and should not be used as the sole basis for treatment or other patient management decisions.  A negative result may occur with improper specimen collection / handling, submission of specimen other than nasopharyngeal swab, presence of viral mutation(s) within the areas targeted by this assay, and inadequate number of viral copies (<250 copies / mL). A negative result must be combined with clinical observations, patient history, and epidemiological information.  Fact Sheet for Patients:   StrictlyIdeas.no  Fact Sheet for Healthcare Providers: BankingDealers.co.za  This test is not yet approved or  cleared by the [place] FDA and has been authorized for detection and/or diagnosis of SARS-CoV-2 by FDA under an Emergency Use Authorization (EUA).  This EUA will remain in effect (meaning this test can be used) for the duration of the COVID-19 declaration under Section 564(b)(1) of the Act, 21 U.S.C. section 360bbb-3(b)(1), unless the authorization is terminated or revoked sooner.  Performed at [facility], [address] [place],  [address]      Time coordinating discharge: Over 30 minutes  SIGNED:   Shawna Clamp, MD  [facility] 11/04/2020, 11:38 AM Pager   If 7PM-7AM, please contact night-coverage [email or web address]

## 2020-11-04 NOTE — Care Management Important Message (Signed)
Important Message  Patient Details  Name: JONOTHAN HEBERLE MRN: [id number] Date of Birth: [birth year]   Medicare Important Message Given:  N/A - LOS <3 / Initial given by admissions  Initial Medicare IM reviewed with Norberto Sorenson, spouse, by Wendall Stade, Patient Access Associate on 11/03/2020 at 9:57am.   Dannette Barbara 11/04/2020, 9:41 AM

## 2020-11-05 DIAGNOSIS — S7291XA Unspecified fracture of right femur, initial encounter for closed fracture: Secondary | ICD-10-CM | POA: Diagnosis not present

## 2020-11-05 DIAGNOSIS — F330 Major depressive disorder, recurrent, mild: Secondary | ICD-10-CM | POA: Diagnosis not present

## 2020-11-05 DIAGNOSIS — I10 Essential (primary) hypertension: Secondary | ICD-10-CM | POA: Diagnosis not present

## 2020-11-06 ENCOUNTER — Telehealth: Payer: Self-pay | Admitting: Family Medicine

## 2020-11-06 NOTE — Telephone Encounter (Signed)
Pt left v/m that pt is at [facility] rehab center;pt wants to know if Dr Silvio Pate put in orders at [facility] for pt to have stool softeners for a couple of days and then have a suppository for constipation; pt thinks this needs to be done and request cb after Dr Silvio Pate reviews. Sending note to DR Silvio Pate and Larene Beach CMA.

## 2020-11-06 NOTE — Telephone Encounter (Signed)
Patient has some questions about the medication that DR Silvio Pate prescribed to him. Patient is requesting a call back to discuss. EM

## 2020-11-06 NOTE — Telephone Encounter (Signed)
Let her know that she should address all questions to the nurses at [facility] I will address this tomorrow when I am there

## 2020-11-06 NOTE — Telephone Encounter (Signed)
Spoke to pt. Advised him to ask one of the nurses there and that Dr Silvio Pate would address it tomorrow.

## 2020-11-06 NOTE — Telephone Encounter (Signed)
This can be addressed at [facility] in the morning

## 2020-11-06 NOTE — Telephone Encounter (Signed)
Larene Beach CMA said Dr Silvio Pate has already left today and could send note to Avie Echevaria NP who is in office.

## 2020-11-14 DIAGNOSIS — F3341 Major depressive disorder, recurrent, in partial remission: Secondary | ICD-10-CM | POA: Diagnosis not present

## 2020-11-18 DIAGNOSIS — M25551 Pain in right hip: Secondary | ICD-10-CM | POA: Diagnosis not present

## 2020-11-18 DIAGNOSIS — Z8781 Personal history of (healed) traumatic fracture: Secondary | ICD-10-CM | POA: Diagnosis not present

## 2020-11-18 DIAGNOSIS — M7541 Impingement syndrome of right shoulder: Secondary | ICD-10-CM | POA: Diagnosis not present

## 2020-11-18 DIAGNOSIS — M7542 Impingement syndrome of left shoulder: Secondary | ICD-10-CM | POA: Diagnosis not present

## 2020-11-21 ENCOUNTER — Telehealth: Payer: Self-pay

## 2020-11-21 DIAGNOSIS — F338 Other recurrent depressive disorders: Secondary | ICD-10-CM | POA: Diagnosis not present

## 2020-11-21 DIAGNOSIS — E785 Hyperlipidemia, unspecified: Secondary | ICD-10-CM | POA: Diagnosis not present

## 2020-11-21 DIAGNOSIS — Z7982 Long term (current) use of aspirin: Secondary | ICD-10-CM | POA: Diagnosis not present

## 2020-11-21 DIAGNOSIS — G4700 Insomnia, unspecified: Secondary | ICD-10-CM | POA: Diagnosis not present

## 2020-11-21 DIAGNOSIS — G40309 Generalized idiopathic epilepsy and epileptic syndromes, not intractable, without status epilepticus: Secondary | ICD-10-CM | POA: Diagnosis not present

## 2020-11-21 DIAGNOSIS — M160 Bilateral primary osteoarthritis of hip: Secondary | ICD-10-CM | POA: Diagnosis not present

## 2020-11-21 DIAGNOSIS — K2270 Barrett's esophagus without dysplasia: Secondary | ICD-10-CM | POA: Diagnosis not present

## 2020-11-21 DIAGNOSIS — I129 Hypertensive chronic kidney disease with stage 1 through stage 4 chronic kidney disease, or unspecified chronic kidney disease: Secondary | ICD-10-CM | POA: Diagnosis not present

## 2020-11-21 DIAGNOSIS — K219 Gastro-esophageal reflux disease without esophagitis: Secondary | ICD-10-CM | POA: Diagnosis not present

## 2020-11-21 DIAGNOSIS — Z85828 Personal history of other malignant neoplasm of skin: Secondary | ICD-10-CM | POA: Diagnosis not present

## 2020-11-21 DIAGNOSIS — D519 Vitamin B12 deficiency anemia, unspecified: Secondary | ICD-10-CM | POA: Diagnosis not present

## 2020-11-21 DIAGNOSIS — N1832 Chronic kidney disease, stage 3b: Secondary | ICD-10-CM | POA: Diagnosis not present

## 2020-11-21 DIAGNOSIS — S72141D Displaced intertrochanteric fracture of right femur, subsequent encounter for closed fracture with routine healing: Secondary | ICD-10-CM | POA: Diagnosis not present

## 2020-11-21 DIAGNOSIS — Z9181 History of falling: Secondary | ICD-10-CM | POA: Diagnosis not present

## 2020-11-21 DIAGNOSIS — N200 Calculus of kidney: Secondary | ICD-10-CM | POA: Diagnosis not present

## 2020-11-21 DIAGNOSIS — M47816 Spondylosis without myelopathy or radiculopathy, lumbar region: Secondary | ICD-10-CM | POA: Diagnosis not present

## 2020-11-21 DIAGNOSIS — Z87891 Personal history of nicotine dependence: Secondary | ICD-10-CM | POA: Diagnosis not present

## 2020-11-21 DIAGNOSIS — Z8673 Personal history of transient ischemic attack (TIA), and cerebral infarction without residual deficits: Secondary | ICD-10-CM | POA: Diagnosis not present

## 2020-11-21 NOTE — Telephone Encounter (Signed)
  Chronic Care Management   Outreach Note  11/21/2020 Name: Justin Ford MRN: [id number] DOB: Jan 27, 1930  Referred by: [name], MD Reason for referral : Chronic Care Management (Initial telephone outreach)   Successful contact was made with patient's daughter/ Designated party release, Darliss Cheney to discuss care management and care coordination services for patient. Daughter request a return call on tomorrow.    Follow Up Plan: The care management team will reach out to the patient again over the next 3 business days.   Quinn Plowman RN,BSN,CCM RN Case Manager [place]  [phone]

## 2020-11-22 ENCOUNTER — Telehealth: Payer: Self-pay

## 2020-11-22 ENCOUNTER — Telehealth: Payer: Medicare Other

## 2020-11-22 ENCOUNTER — Ambulatory Visit (INDEPENDENT_AMBULATORY_CARE_PROVIDER_SITE_OTHER): Payer: Medicare Other

## 2020-11-22 DIAGNOSIS — I10 Essential (primary) hypertension: Secondary | ICD-10-CM | POA: Diagnosis not present

## 2020-11-22 DIAGNOSIS — I129 Hypertensive chronic kidney disease with stage 1 through stage 4 chronic kidney disease, or unspecified chronic kidney disease: Secondary | ICD-10-CM | POA: Diagnosis not present

## 2020-11-22 DIAGNOSIS — G40909 Epilepsy, unspecified, not intractable, without status epilepticus: Secondary | ICD-10-CM

## 2020-11-22 DIAGNOSIS — G40309 Generalized idiopathic epilepsy and epileptic syndromes, not intractable, without status epilepticus: Secondary | ICD-10-CM | POA: Diagnosis not present

## 2020-11-22 DIAGNOSIS — S72141D Displaced intertrochanteric fracture of right femur, subsequent encounter for closed fracture with routine healing: Secondary | ICD-10-CM | POA: Diagnosis not present

## 2020-11-22 DIAGNOSIS — M160 Bilateral primary osteoarthritis of hip: Secondary | ICD-10-CM | POA: Diagnosis not present

## 2020-11-22 DIAGNOSIS — M47816 Spondylosis without myelopathy or radiculopathy, lumbar region: Secondary | ICD-10-CM | POA: Diagnosis not present

## 2020-11-22 DIAGNOSIS — N1832 Chronic kidney disease, stage 3b: Secondary | ICD-10-CM | POA: Diagnosis not present

## 2020-11-22 DIAGNOSIS — R296 Repeated falls: Secondary | ICD-10-CM

## 2020-11-22 NOTE — Telephone Encounter (Signed)
Stephanie with [facility] called for verbal OT orders 1 x week for 1 wwek, 2 x week for 2 weeks, and then 1 x week for 1 week. Please call orders to [phone].

## 2020-11-23 NOTE — Patient Instructions (Addendum)
Visit Information  PATIENT GOALS:   Goals Addressed            This Visit's Progress   . Decrease occurence of seizure       Timeframe:  Long-Range Goal Priority:  High Start Date:  11/22/2020                           Expected End Date:    03/03/2021  Follow up: 12/19/2020                  - Keep a seizure diary/log - Take your medications regularly - Don't stop seizure medications abruptly - Avoid alcohol - Avoid sleep deprivation - Try to find out triggers for your seizures (e.g. flashing lights, stress) and try to avoid them.     . Patient will not sustain a fall       Timeframe:  Long-Range Goal Priority:  High Start Date: 11/22/2020                            Expected End Date: 01/31/2021                       Follow Up Date 11/19/2020   - always use handrails on the stairs - always wear low-heeled or flat shoes or slippers with nonskid soles - keep my cell phone with me always - make an emergency alert plan in case I fall - pick up clutter from the floors - use a nonslip pad with throw rugs, or remove them completely - use a cane or walker - use a nightlight in the bathroom  - Continue with physical / occupational therapy as recommended by your doctor.  - Check your blood pressure at least 2-3 times a week.    Why is this important?    Most falls happen when it is hard for you to walk safely. Your balance may be off because of an illness. You may have pain in your knees, hip or other joints.   You may be overly tired or taking medicines that make you sleepy. You may not be able to see or hear clearly.   Falls can lead to broken bones, bruises or other injuries.   There are things you can do to help prevent falling.     Notes:     . Track and Manage My Blood Pressure-Hypertension       Timeframe:  Long-Range Goal Priority:  High Start Date: 11/22/2020                            Expected End Date: 03/03/2021                      Follow Up Date 12/19/2020     - check blood pressure 3 times per week - write blood pressure results in a log or diary and take to provider visits.  - take your medications as prescribed.  - Attend follow up appointments with providers - Review high blood pressure and low salt diet information.    Why is this important?    You won't feel high blood pressure, but it can still hurt your blood vessels.   High blood pressure can cause heart or kidney problems. It can also cause a stroke.   Making lifestyle changes like losing  a little weight or eating less salt will help.   Checking your blood pressure at home and at different times of the day can help to control blood pressure.   If the doctor prescribes medicine remember to take it the way the doctor ordered.   Call the office if you cannot afford the medicine or if there are questions about it.     Notes:      Fall Prevention in the Home, Adult Falls can cause injuries and can happen to people of all ages. There are many things you can do to make your home safe and to help prevent falls. Ask for help when making these changes. What actions can I take to prevent falls? General Instructions  Use good lighting in all rooms. Replace any light bulbs that burn out.  Turn on the lights in dark areas. Use night-lights.  Keep items that you use often in easy-to-reach places. Lower the shelves around your home if needed.  Set up your furniture so you have a clear path. Avoid moving your furniture around.  Do not have throw rugs or other things on the floor that can make you trip.  Avoid walking on wet floors.  If any of your floors are uneven, fix them.  Add color or contrast paint or tape to clearly mark and help you see: ? Grab bars or handrails. ? First and last steps of staircases. ? Where the edge of each step is.  If you use a stepladder: ? Make sure that it is fully opened. Do not climb a closed stepladder. ? Make sure the sides of the stepladder  are locked in place. ? Ask someone to hold the stepladder while you use it.  Know where your pets are when moving through your home. What can I do in the bathroom?  Keep the floor dry. Clean up any water on the floor right away.  Remove soap buildup in the tub or shower.  Use nonskid mats or decals on the floor of the tub or shower.  Attach bath mats securely with double-sided, nonslip rug tape.  If you need to sit down in the shower, use a plastic, nonslip stool.  Install grab bars by the toilet and in the tub and shower. Do not use towel bars as grab bars.      What can I do in the bedroom?  Make sure that you have a light by your bed that is easy to reach.  Do not use any sheets or blankets for your bed that hang to the floor.  Have a firm chair with side arms that you can use for support when you get dressed. What can I do in the kitchen?  Clean up any spills right away.  If you need to reach something above you, use a step stool with a grab bar.  Keep electrical cords out of the way.  Do not use floor polish or wax that makes floors slippery. What can I do with my stairs?  Do not leave any items on the stairs.  Make sure that you have a light switch at the top and the bottom of the stairs.  Make sure that there are handrails on both sides of the stairs. Fix handrails that are broken or loose.  Install nonslip stair treads on all your stairs.  Avoid having throw rugs at the top or bottom of the stairs.  Choose a carpet that does not hide the edge of the steps on  the stairs.  Check carpeting to make sure that it is firmly attached to the stairs. Fix carpet that is loose or worn. What can I do on the outside of my home?  Use bright outdoor lighting.  Fix the edges of walkways and driveways and fix any cracks.  Remove anything that might make you trip as you walk through a door, such as a raised step or threshold.  Trim any bushes or trees on paths to your  home.  Check to see if handrails are loose or broken and that both sides of all steps have handrails.  Install guardrails along the edges of any raised decks and porches.  Clear paths of anything that can make you trip, such as tools or rocks.  Have leaves, snow, or ice cleared regularly.  Use sand or salt on paths during winter.  Clean up any spills in your garage right away. This includes grease or oil spills. What other actions can I take?  Wear shoes that: ? Have a low heel. Do not wear high heels. ? Have rubber bottoms. ? Feel good on your feet and fit well. ? Are closed at the toe. Do not wear open-toe sandals.  Use tools that help you move around if needed. These include: ? Canes. ? Walkers. ? Scooters. ? Crutches.  Review your medicines with your doctor. Some medicines can make you feel dizzy. This can increase your chance of falling. Ask your doctor what else you can do to help prevent falls. Where to find more information  Centers for Disease Control and Prevention, STEADI: http://www.wolf.info/  National Institute on Aging: http://kim-miller.com/ Contact a doctor if:  You are afraid of falling at home.  You feel weak, drowsy, or dizzy at home.  You fall at home. Summary  There are many simple things that you can do to make your home safe and to help prevent falls.  Ways to make your home safe include removing things that can make you trip and installing grab bars in the bathroom.  Ask for help when making these changes in your home. This information is not intended to replace advice given to you by your health care provider. Make sure you discuss any questions you have with your health care provider. Document Revised: 04/24/2020 Document Reviewed: 04/24/2020 Elsevier Patient Education  2021 [place] for Falls Each year, millions of people have serious injuries from falls. It is important to understand your risk for falling. Talk with your  health care provider about your risk and what you can do to lower it. There are actions you can take at home to lower your risk and prevent falls. If you do have a serious fall, make sure to tell your health care provider. Falling once raises your risk of falling again. How can falls affect me? Serious injuries from falls are common. These include:  Broken bones, such as hip fractures.  Head injuries, such as traumatic brain injuries (TBI) or concussion. A fear of falling can cause you to avoid activities and stay at home. This can make your muscles weaker and actually raise your risk for a fall. What can increase my risk? There are a number of risk factors that increase your risk for falling. The more risk factors you have, the higher your risk of falling. Serious injuries from a fall happen most often to people older than age 69. Children and young adults ages 89-29 are also at higher risk. Common risk factors include:  Weakness in the lower body.  Lack (deficiency) of vitamin D.  Being generally weak or confused due to long-term (chronic) illness.  Dizziness or balance problems.  Poor vision.  Medicines that cause dizziness or drowsiness. These can include medicines for your blood pressure, heart, anxiety, insomnia, or edema, as well as pain medicines and muscle relaxants. Other risk factors include:  Drinking alcohol.  Having had a fall in the past.  Having depression.  Having foot pain or wearing improper footwear.  Working at a dangerous job.  Having any of the following in your home: ? Tripping hazards, such as floor clutter or loose rugs. ? Poor lighting. ? Pets.  Having dementia or memory loss. What actions can I take to lower my risk of falling? Physical activity Maintain physical fitness. Do strength and balance exercises. Consider taking a regular class to build strength and balance. Yoga and tai chi are good options. Vision Have your eyes checked every year  and your vision prescription updated as needed. Walking aids and footwear  Wear nonskid shoes. Do not wear high heels.  Do not walk around the house in socks or slippers.  Use a cane or walker as told by your health care provider. Home safety  Attach secure railings on both sides of your stairs.  Install grab bars for your tub, shower, and toilet. Use a bath mat in your tub or shower.  Use good lighting in all rooms. Keep a flashlight near your bed.  Make sure there is a clear path from your bed to the bathroom. Use night-lights.  Do not use throw rugs. Make sure all carpeting is taped or tacked down securely.  Remove all clutter from walkways and stairways, including extension cords.  Repair uneven or broken steps.  Avoid walking on icy or slippery surfaces. Walk on the grass instead of on icy or slick sidewalks. Use ice melt to get rid of ice on walkways.  Use a cordless phone.      Questions to ask your health care provider  Can you help me check my risk for a fall?  Do any of my medicines make me more likely to fall?  Should I take a vitamin D supplement?  What exercises can I do to improve my strength and balance?  Should I make an appointment to have my vision checked?  Do I need a bone density test to check for weak bones or osteoporosis?  Would it help to use a cane or a walker? Where to find more information  Centers for Disease Control and Prevention, STEADI: FootballExhibition.com.br  Community-Based Fall Prevention Programs: FootballExhibition.com.br  National Institute on Aging: https://walker.com/ Contact a health care provider if:  You fall at home.  You are afraid of falling at home.  You feel weak, drowsy, or dizzy. Summary  Serious injuries from a fall happen most often to people older than age 57. Children and young adults ages 77-29 are also at higher risk.  Talk with your health care provider about your risks for falling and how to lower those risks.  Taking  certain precautions at home can lower your risk for falling.  If you fall, always tell your health care provider. This information is not intended to replace advice given to you by your health care provider. Make sure you discuss any questions you have with your health care provider. Document Revised: 04/24/2020 Document Reviewed: 04/24/2020 Elsevier Patient Education  2021 Elsevier Inc.   Justin Ford was given information about Care  Management services today including:  1. Care Management services include personalized support from designated clinical staff supervised by his physician, including individualized plan of care and coordination with other care providers 2. 24/7 contact phone numbers for assistance for urgent and routine care needs. 3. The patient may stop CCM services at any time (effective at the end of the month) by phone call to the office staff.  Patient agreed to services and verbal consent obtained.   Patient verbalizes understanding of instructions provided today and agrees to view in [place].   The patient has been provided with contact information for the care management team and has been advised to call with any health related questions or concerns.  The care management team will reach out to the patient again over the next 30 days.   Justin Plowman RN,BSN,CCM RN Case Manager Justin Ford  [phone]  Consent to CCM Services: Mr. Fawaz was given information about Chronic Care Management services today including:  4. CCM service includes personalized support from designated clinical staff supervised by his physician, including individualized plan of care and coordination with other care providers 5. 24/7 contact phone numbers for assistance for urgent and routine care needs. 6. Service will only be billed when office clinical staff spend 20 minutes or more in a month to coordinate care. 7. Only one practitioner may furnish and bill the service in a calendar  month. 8. The patient may stop CCM services at any time (effective at the end of the month) by phone call to the office staff. 9. The patient will be responsible for cost sharing (co-pay) of up to 20% of the service fee (after annual deductible is met).  Patient agreed to services and verbal consent obtained.   Patient verbalizes understanding of instructions provided today and agrees to view in [place].   The patient has been provided with contact information for the care management team and has been advised to call with any health related questions or concerns.  The care management team will reach out to the patient again over the next 30 days.   Justin Plowman RN,BSN,CCM RN Case Manager [place]  [phone]  CLINICAL CARE PLAN: Patient Care Plan: Fall Risk  Problem Identified: Recent fall   Priority: High  Long-Range Goal: Absence of Fall and Fall-Related Injury   Start Date: 11/22/2020  Expected End Date: 01/31/2021  This Visit's Progress: On track  Priority: High  Current Barriers:  [name] Knowledge Deficits related to fall precautions in patient with recent surgical repair of fractured right hip and impaired mobility.  Clinical Goal(s):  [name] Patient and/or caregiver will demonstrate improved adherence to prescribed treatment plan for decreasing falls as evidenced by patient reporting and review of EMR . Patient and/or caregiver will verbalize using fall risk reduction strategies discussed . Patient will not experience additional falls . patient will not experience a fall related hospital re-admission.  . patient will attend all scheduled medical appointments:  Interventions:  . Collaboration with [name], MD regarding development and update of comprehensive plan of care as evidenced by provider attestation and co-signature . Inter-disciplinary care team collaboration (see longitudinal plan of care) . Provided written and verbal education re: Potential causes of  falls and Fall prevention strategies . Reviewed medications and discussed potential side effects of medications such as dizziness and frequent urination . Discussed with patient's daughter risk of falls related to seizures and the importance of taking medications as prescribed.   Patient Goals/Self-Care Activities .  Patient will:   . always use handrails on the stairs . always wear secure fitting  low-heeled or flat shoes or slippers with nonskid soles when ambulating.  [name] keep my cell phone with me always . make an emergency alert plan in case I fall . pick up clutter from the floors . use a nonslip pad with throw rugs, or remove them completely . use a cane or walker . use a nightlight in the bathroom . Utilize walker as advised by your provider and/or physical therapist . Utilize home lighting for dim lit areas  Follow Up Plan: The patient has been provided with contact information for the care management team and has been advised to call with any health related questions or concerns.  The care management team will reach out to the patient again over the next 2 weeks.    Patient Care Plan: Hypertension  Problem Identified: Disease Progression (Hypertension)   Priority: High  Long-Range Goal: Disease Progression Prevented or Minimized   Start Date: 11/22/2020  Expected End Date: 03/03/2021  This Visit's Progress: On track  Priority: High  Objective:  . Last practice recorded BP readings:  BP Readings from Last 3 Encounters:  11/04/20 127/76  08/05/20 110/72  07/31/20 104/74   Current Barriers:  [name] Knowledge Deficits related to long term self care management of Hypertension: Daughter assist client with health disease management  Case Manager Clinical Goal(s):  . patient will verbalize understanding of plan for hypertension management . patient will attend all scheduled medical appointments . patient will demonstrate improved adherence to prescribed treatment plan for  hypertension as evidenced by taking all medications as prescribed, monitoring and recording blood pressure as directed, adhering to low sodium/DASH diet Interventions:  . Collaboration with [name], MD regarding development and update of comprehensive plan of care as evidenced by provider attestation and co-signature . Inter-disciplinary care team collaboration (see longitudinal plan of care) . Evaluation of current treatment plan related to hypertension self management and patient's adherence to plan as established by provider. . Reviewed medications with patient's daughter and discussed importance of compliance . Discussed plans with patient's daughter for ongoing care management follow up and provided patient with direct contact information for care management team . Advised patient's daughter, providing verbal and written education and rationale, to monitor blood pressure daily and record, calling PCP for findings outside established parameters.  Patient Goals: - check blood pressure 3 times per week - write blood pressure results in a log or diary and take to provider visits.  - take your medications as prescribed.  - Attend follow up appointments with providers - Review high blood pressure and low salt diet information.  Self-Care Activities: - Self administers medications as prescribed Attends all scheduled provider appointments Calls provider office for new concerns, questions, or BP outside discussed parameters Checks BP and records as discussed Follows a low sodium diet/DASH diet Follow Up Plan: The patient has been provided with contact information for the care management team and has been advised to call with any health related questions or concerns.  The care management team will reach out to the patient again over the next 30 days.

## 2020-11-24 NOTE — Telephone Encounter (Signed)
Please give the order.  Thanks.   

## 2020-11-25 DIAGNOSIS — M160 Bilateral primary osteoarthritis of hip: Secondary | ICD-10-CM | POA: Diagnosis not present

## 2020-11-25 DIAGNOSIS — N1832 Chronic kidney disease, stage 3b: Secondary | ICD-10-CM | POA: Diagnosis not present

## 2020-11-25 DIAGNOSIS — I129 Hypertensive chronic kidney disease with stage 1 through stage 4 chronic kidney disease, or unspecified chronic kidney disease: Secondary | ICD-10-CM | POA: Diagnosis not present

## 2020-11-25 DIAGNOSIS — G40309 Generalized idiopathic epilepsy and epileptic syndromes, not intractable, without status epilepticus: Secondary | ICD-10-CM | POA: Diagnosis not present

## 2020-11-25 DIAGNOSIS — S72141D Displaced intertrochanteric fracture of right femur, subsequent encounter for closed fracture with routine healing: Secondary | ICD-10-CM | POA: Diagnosis not present

## 2020-11-25 DIAGNOSIS — M47816 Spondylosis without myelopathy or radiculopathy, lumbar region: Secondary | ICD-10-CM | POA: Diagnosis not present

## 2020-11-25 NOTE — Chronic Care Management (AMB) (Signed)
Chronic Care Management   CCM RN Visit Note  11/25/2020 Name: Justin Ford MRN: [id number] DOB: 03-31-1930  Subjective: Justin Ford is a 87 y.o. year old male who is a primary care patient of [name], MD. The care management team was consulted for assistance with disease management and care coordination needs.    Engaged with patient by telephone for initial visit in response to provider referral for case management and/or care coordination services.   Consent to Services:  The patient was given the following information about Chronic Care Management services today, agreed to services, and gave verbal consent: 1. CCM service includes personalized support from designated clinical staff supervised by the primary care provider, including individualized plan of care and coordination with other care providers 2. 24/7 contact phone numbers for assistance for urgent and routine care needs. 3. Service will only be billed when office clinical staff spend 20 minutes or more in a month to coordinate care. 4. Only one practitioner may furnish and bill the service in a calendar month. 5.The patient may stop CCM services at any time (effective at the end of the month) by phone call to the office staff. 6. The patient will be responsible for cost sharing (co-pay) of up to 20% of the service fee (after annual deductible is met). Patient agreed to services and consent obtained.  Patient agreed to services and verbal consent obtained.   Assessment: Review of patient past medical history, allergies, medications, health status, including review of consultants reports, laboratory and other test data, was performed as part of comprehensive evaluation and provision of chronic care management services.   SDOH (Social Determinants of Health) assessments and interventions performed:    CCM Care Plan  Allergies  Allergen Reactions  . Lisinopril Other (See Comments)    Elevated creatinine-caution on  dosing.  . Lipitor [Atorvastatin] Other (See Comments)    Reaction:  Unknown     Outpatient Encounter Medications as of 11/22/2020  Medication Sig Note  . amLODipine (NORVASC) 5 MG tablet Take 1 tablet (5 mg total) by mouth daily.   [name] aspirin EC 81 MG tablet Take 81 mg by mouth daily. Swallow whole.   . cyanocobalamin (,VITAMIN B-12,) 1000 MCG/ML injection 1063mg IM monthly 11/02/2020: Last received in November  . pantoprazole (PROTONIX) 40 MG tablet Take 1 tablet (40 mg total) by mouth daily.   . rosuvastatin (CRESTOR) 20 MG tablet Take 1 tablet by mouth once daily   . enoxaparin (LOVENOX) 30 MG/0.3ML injection Inject 0.3 mLs (30 mg total) into the skin daily. (Patient not taking: Reported on 11/22/2020)   . levETIRAcetam (KEPPRA) 500 MG tablet Take 1 tablet (500 mg total) by mouth 2 (two) times daily. 11/22/2020: Daughter report patient is still taking.    No facility-administered encounter medications on file as of 11/22/2020.    Patient Active Problem List   Diagnosis Date Noted  . Closed right hip fracture, initial encounter ([facility] 11/02/2020  . Epilepsy undetermined as to focal or generalized ([place] 11/02/2020  . Insomnia 08/07/2020  . Dysarthria 08/07/2020  . Gait abnormality 05/15/2020  . Angiodysplasia of intestinal tract   . Polyp of colon   . Melena   . Angiodysplasia of stomach   . Right foot drop 02/28/2017  . Left leg swelling 10/30/2016  . Gout 10/11/2016  . B12 deficiency 05/21/2016  . Bruising 05/21/2016  . Tremor 03/08/2016  . Hamstring strain 11/05/2015  . Multiple falls 11/05/2015  . CVA (cerebral infarction) 09/23/2015  .  Lower back pain 09/12/2015  . Shoulder pain 09/12/2015  . 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 TIA (transient ischemic attack) 06/21/2015  . Hyperglycemia 08/09/2013  . Greater trochanteric bursitis 08/09/2013  . Trigger finger 06/21/2012  . Advance directive on file 03/22/2012  . Medicare annual wellness visit, subsequent  03/15/2012  . Dupuytren contracture 03/15/2012  . Cough 12/11/2011  . Barrett esophagus 10/12/2011  . Chronic kidney disease, stage III (moderate) ([place]) 10/12/2011  . HTN (hypertension) 09/27/2011  . HLD (hyperlipidemia) 09/27/2011  . GERD (gastroesophageal reflux disease) 09/27/2011    Conditions to be addressed/monitored:HTN and falls, seizures  Care Plan : Fall Risk  Updates made by Dannielle Karvonen, RN since 11/25/2020 12:00 AM  Problem: Recent fall   Priority: High  Long-Range Goal: Absence of Fall and Fall-Related Injury   Start Date: 11/22/2020  Expected End Date: 01/31/2021  This Visit's Progress: On track  Priority: High  Current Barriers:  [name] Knowledge Deficits related to fall precautions in patient with recent surgical repair of fractured right hip and impaired mobility.  Clinical Goal(s):  [name] Patient and/or caregiver will demonstrate improved adherence to prescribed treatment plan for decreasing falls as evidenced by patient reporting and review of EMR . Patient and/or caregiver will verbalize using fall risk reduction strategies discussed . Patient will not experience additional falls . patient will not experience a fall related hospital re-admission.  . patient will attend all scheduled medical appointments:  Interventions:  . Collaboration with [name], MD regarding development and update of comprehensive plan of care as evidenced by provider attestation and co-signature . Inter-disciplinary care team collaboration (see longitudinal plan of care) . Provided written and verbal education re: Potential causes of falls and Fall prevention strategies . Reviewed medications and discussed potential side effects of medications such as dizziness and frequent urination . Discussed with patient's daughter risk of falls related to seizures and the importance of taking medications as prescribed.   Patient Goals/Self-Care Activities . Patient will:   . always use handrails  on the stairs . always wear secure fitting  low-heeled or flat shoes or slippers with nonskid soles when ambulating.  [name] keep my cell phone with me always . make an emergency alert plan in case I fall . pick up clutter from the floors . use a nonslip pad with throw rugs, or remove them completely . use a cane or walker . use a nightlight in the bathroom . Utilize walker as advised by your provider and/or physical therapist . Utilize home lighting for dim lit areas  Follow Up Plan: The patient has been provided with contact information for the care management team and has been advised to call with any health related questions or concerns.  The care management team will reach out to the patient again over the next 2 weeks.    Care Plan : Hypertension  Updates made by Dannielle Karvonen, RN since 11/25/2020 12:00 AM  Problem: Disease Progression (Hypertension)   Priority: High  Long-Range Goal: Disease Progression Prevented or Minimized   Start Date: 11/22/2020  Expected End Date: 03/03/2021  This Visit's Progress: On track  Priority: High  Objective:  . Last practice recorded BP readings:  BP Readings from Last 3 Encounters:  11/04/20 127/76  08/05/20 110/72  07/31/20 104/74   Current Barriers:  [name] Knowledge Deficits related to long term self care management of Hypertension: Daughter assist client with health disease management  Case Manager Clinical Goal(s):  . patient will  verbalize understanding of plan for hypertension management . patient will attend all scheduled medical appointments . patient will demonstrate improved adherence to prescribed treatment plan for hypertension as evidenced by taking all medications as prescribed, monitoring and recording blood pressure as directed, adhering to low sodium/DASH diet Interventions:  . Collaboration with [name], MD regarding development and update of comprehensive plan of care as evidenced by provider attestation and  co-signature . Inter-disciplinary care team collaboration (see longitudinal plan of care) . Evaluation of current treatment plan related to hypertension self management and patient's adherence to plan as established by provider. . Reviewed medications with patient's daughter and discussed importance of compliance . Discussed plans with patient's daughter for ongoing care management follow up and provided patient with direct contact information for care management team . Advised patient's daughter, providing verbal and written education and rationale, to monitor blood pressure daily and record, calling PCP for findings outside established parameters.  Patient Goals: - check blood pressure 3 times per week - write blood pressure results in a log or diary and take to provider visits.  - take your medications as prescribed.  - Attend follow up appointments with providers - Review high blood pressure and low salt diet information.  Self-Care Activities: - Self administers medications as prescribed Attends all scheduled provider appointments Calls provider office for new concerns, questions, or BP outside discussed parameters Checks BP and records as discussed Follows a low sodium diet/DASH diet Follow Up Plan: The patient has been provided with contact information for the care management team and has been advised to call with any health related questions or concerns.  The care management team will reach out to the patient again over the next 30 days.      Plan:The patient has been provided with contact information for the care management team and has been advised to call with any health related questions or concerns.  and The care management team will reach out to the patient again over the next 30 days.  Quinn Plowman RN,BSN,CCM RN Case Manager [place]  [phone]

## 2020-11-25 NOTE — Telephone Encounter (Signed)
Left message for Colletta Maryland to give verbal orders for OT by Dr. Damita Dunnings. Advised to call back if she has any questions.

## 2020-11-26 DIAGNOSIS — G40309 Generalized idiopathic epilepsy and epileptic syndromes, not intractable, without status epilepticus: Secondary | ICD-10-CM | POA: Diagnosis not present

## 2020-11-26 DIAGNOSIS — M47816 Spondylosis without myelopathy or radiculopathy, lumbar region: Secondary | ICD-10-CM | POA: Diagnosis not present

## 2020-11-26 DIAGNOSIS — M160 Bilateral primary osteoarthritis of hip: Secondary | ICD-10-CM | POA: Diagnosis not present

## 2020-11-26 DIAGNOSIS — S72141D Displaced intertrochanteric fracture of right femur, subsequent encounter for closed fracture with routine healing: Secondary | ICD-10-CM | POA: Diagnosis not present

## 2020-11-26 DIAGNOSIS — I129 Hypertensive chronic kidney disease with stage 1 through stage 4 chronic kidney disease, or unspecified chronic kidney disease: Secondary | ICD-10-CM | POA: Diagnosis not present

## 2020-11-26 DIAGNOSIS — N1832 Chronic kidney disease, stage 3b: Secondary | ICD-10-CM | POA: Diagnosis not present

## 2020-11-27 DIAGNOSIS — N1832 Chronic kidney disease, stage 3b: Secondary | ICD-10-CM | POA: Diagnosis not present

## 2020-11-27 DIAGNOSIS — M160 Bilateral primary osteoarthritis of hip: Secondary | ICD-10-CM | POA: Diagnosis not present

## 2020-11-27 DIAGNOSIS — M47816 Spondylosis without myelopathy or radiculopathy, lumbar region: Secondary | ICD-10-CM | POA: Diagnosis not present

## 2020-11-27 DIAGNOSIS — S72141D Displaced intertrochanteric fracture of right femur, subsequent encounter for closed fracture with routine healing: Secondary | ICD-10-CM | POA: Diagnosis not present

## 2020-11-27 DIAGNOSIS — I129 Hypertensive chronic kidney disease with stage 1 through stage 4 chronic kidney disease, or unspecified chronic kidney disease: Secondary | ICD-10-CM | POA: Diagnosis not present

## 2020-11-27 DIAGNOSIS — G40309 Generalized idiopathic epilepsy and epileptic syndromes, not intractable, without status epilepticus: Secondary | ICD-10-CM | POA: Diagnosis not present

## 2020-11-28 DIAGNOSIS — S72141D Displaced intertrochanteric fracture of right femur, subsequent encounter for closed fracture with routine healing: Secondary | ICD-10-CM | POA: Diagnosis not present

## 2020-11-28 DIAGNOSIS — I129 Hypertensive chronic kidney disease with stage 1 through stage 4 chronic kidney disease, or unspecified chronic kidney disease: Secondary | ICD-10-CM | POA: Diagnosis not present

## 2020-11-28 DIAGNOSIS — M160 Bilateral primary osteoarthritis of hip: Secondary | ICD-10-CM | POA: Diagnosis not present

## 2020-11-28 DIAGNOSIS — N1832 Chronic kidney disease, stage 3b: Secondary | ICD-10-CM | POA: Diagnosis not present

## 2020-11-28 DIAGNOSIS — G40309 Generalized idiopathic epilepsy and epileptic syndromes, not intractable, without status epilepticus: Secondary | ICD-10-CM | POA: Diagnosis not present

## 2020-11-28 DIAGNOSIS — M47816 Spondylosis without myelopathy or radiculopathy, lumbar region: Secondary | ICD-10-CM | POA: Diagnosis not present

## 2020-12-01 DIAGNOSIS — M47816 Spondylosis without myelopathy or radiculopathy, lumbar region: Secondary | ICD-10-CM | POA: Diagnosis not present

## 2020-12-01 DIAGNOSIS — S72141D Displaced intertrochanteric fracture of right femur, subsequent encounter for closed fracture with routine healing: Secondary | ICD-10-CM | POA: Diagnosis not present

## 2020-12-01 DIAGNOSIS — N1832 Chronic kidney disease, stage 3b: Secondary | ICD-10-CM | POA: Diagnosis not present

## 2020-12-01 DIAGNOSIS — E785 Hyperlipidemia, unspecified: Secondary | ICD-10-CM | POA: Diagnosis not present

## 2020-12-01 DIAGNOSIS — Z8673 Personal history of transient ischemic attack (TIA), and cerebral infarction without residual deficits: Secondary | ICD-10-CM

## 2020-12-01 DIAGNOSIS — Z87891 Personal history of nicotine dependence: Secondary | ICD-10-CM

## 2020-12-01 DIAGNOSIS — K2270 Barrett's esophagus without dysplasia: Secondary | ICD-10-CM | POA: Diagnosis not present

## 2020-12-01 DIAGNOSIS — Z85828 Personal history of other malignant neoplasm of skin: Secondary | ICD-10-CM

## 2020-12-01 DIAGNOSIS — I129 Hypertensive chronic kidney disease with stage 1 through stage 4 chronic kidney disease, or unspecified chronic kidney disease: Secondary | ICD-10-CM | POA: Diagnosis not present

## 2020-12-01 DIAGNOSIS — M160 Bilateral primary osteoarthritis of hip: Secondary | ICD-10-CM | POA: Diagnosis not present

## 2020-12-01 DIAGNOSIS — G40309 Generalized idiopathic epilepsy and epileptic syndromes, not intractable, without status epilepticus: Secondary | ICD-10-CM | POA: Diagnosis not present

## 2020-12-01 DIAGNOSIS — D519 Vitamin B12 deficiency anemia, unspecified: Secondary | ICD-10-CM | POA: Diagnosis not present

## 2020-12-01 DIAGNOSIS — F338 Other recurrent depressive disorders: Secondary | ICD-10-CM | POA: Diagnosis not present

## 2020-12-01 DIAGNOSIS — K219 Gastro-esophageal reflux disease without esophagitis: Secondary | ICD-10-CM | POA: Diagnosis not present

## 2020-12-01 DIAGNOSIS — N200 Calculus of kidney: Secondary | ICD-10-CM | POA: Diagnosis not present

## 2020-12-01 DIAGNOSIS — Z7982 Long term (current) use of aspirin: Secondary | ICD-10-CM

## 2020-12-01 DIAGNOSIS — Z9181 History of falling: Secondary | ICD-10-CM

## 2020-12-01 DIAGNOSIS — G4700 Insomnia, unspecified: Secondary | ICD-10-CM

## 2020-12-02 ENCOUNTER — Encounter: Payer: Self-pay | Admitting: Family Medicine

## 2020-12-02 ENCOUNTER — Other Ambulatory Visit: Payer: Self-pay

## 2020-12-02 ENCOUNTER — Ambulatory Visit (INDEPENDENT_AMBULATORY_CARE_PROVIDER_SITE_OTHER): Payer: Medicare Other | Admitting: Family Medicine

## 2020-12-02 VITALS — BP 122/78 | HR 85 | Temp 97.6°F | Ht 68.0 in | Wt 157.0 lb

## 2020-12-02 DIAGNOSIS — F32A Depression, unspecified: Secondary | ICD-10-CM

## 2020-12-02 DIAGNOSIS — I10 Essential (primary) hypertension: Secondary | ICD-10-CM

## 2020-12-02 DIAGNOSIS — E538 Deficiency of other specified B group vitamins: Secondary | ICD-10-CM | POA: Diagnosis not present

## 2020-12-02 DIAGNOSIS — Z8781 Personal history of (healed) traumatic fracture: Secondary | ICD-10-CM | POA: Diagnosis not present

## 2020-12-02 LAB — CBC WITH DIFFERENTIAL/PLATELET
Basophils Absolute: 0 10*3/uL (ref 0.0–0.1)
Basophils Relative: 0.6 % (ref 0.0–3.0)
Eosinophils Absolute: 0.1 10*3/uL (ref 0.0–0.7)
Eosinophils Relative: 1.6 % (ref 0.0–5.0)
HCT: 35.1 % — ABNORMAL LOW (ref 39.0–52.0)
Hemoglobin: 11.7 g/dL — ABNORMAL LOW (ref 13.0–17.0)
Lymphocytes Relative: 23.3 % (ref 12.0–46.0)
Lymphs Abs: 0.8 10*3/uL (ref 0.7–4.0)
MCHC: 33.3 g/dL (ref 30.0–36.0)
MCV: 88.2 fl (ref 78.0–100.0)
Monocytes Absolute: 0.5 10*3/uL (ref 0.1–1.0)
Monocytes Relative: 15.7 % — ABNORMAL HIGH (ref 3.0–12.0)
Neutro Abs: 2 10*3/uL (ref 1.4–7.7)
Neutrophils Relative %: 58.8 % (ref 43.0–77.0)
Platelets: 200 10*3/uL (ref 150.0–400.0)
RBC: 3.97 Mil/uL — ABNORMAL LOW (ref 4.22–5.81)
RDW: 17.7 % — ABNORMAL HIGH (ref 11.5–15.5)
WBC: 3.5 10*3/uL — ABNORMAL LOW (ref 4.0–10.5)

## 2020-12-02 LAB — BASIC METABOLIC PANEL
BUN: 20 mg/dL (ref 6–23)
CO2: 21 mEq/L (ref 19–32)
Calcium: 9.7 mg/dL (ref 8.4–10.5)
Chloride: 109 mEq/L (ref 96–112)
Creatinine, Ser: 1.71 mg/dL — ABNORMAL HIGH (ref 0.40–1.50)
GFR: 35.33 mL/min — ABNORMAL LOW (ref >60.00)
Glucose, Bld: 98 mg/dL (ref 70–99)
Potassium: 3.1 mEq/L — ABNORMAL LOW (ref 3.5–5.1)
Sodium: 140 mEq/L (ref 135–145)

## 2020-12-02 MED ORDER — LEVETIRACETAM 500 MG PO TABS
500.0000 mg | ORAL_TABLET | Freq: Two times a day (BID) | ORAL | Status: DC
Start: 2020-12-02 — End: 2021-03-04

## 2020-12-02 MED ORDER — "BD INSULIN SYRINGE 25G X 1"" 1 ML MISC": 1 refills | Status: AC

## 2020-12-02 MED ORDER — SERTRALINE HCL 50 MG PO TABS: 50.0000 mg | ORAL_TABLET | Freq: Every day | ORAL | 3 refills | Status: DC

## 2020-12-02 MED ORDER — CYANOCOBALAMIN 1000 MCG/ML IJ SOLN
1000.0000 ug | Freq: Once | INTRAMUSCULAR | Status: AC
  Administered 2020-12-02: 1000 ug via INTRAMUSCULAR

## 2020-12-02 MED ORDER — CYANOCOBALAMIN 1000 MCG/ML IJ SOLN: INTRAMUSCULAR | 3 refills | Status: DC

## 2020-12-02 NOTE — Progress Notes (Signed)
This visit occurred during the SARS-CoV-2 public health emergency.  Safety protocols were in place, including screening questions prior to the visit, additional usage of staff PPE, and extensive cleaning of exam room while observing appropriate contact time as indicated for disinfecting solutions.  SNF f/u.    ===================================  Brief Summary/ Hospital Course: This 87 years old male with PMH significant for hypertension, hyperlipidemia, seizures (multifocal epilepsy)controlled on Keppra presents in the emergency department status post fall while walking down 3 steps of stairs down into the garage. Patient reports he was carrying too many files and documentsand fell. He denies loss of consciousness and head injury. He endorses landing on his right hip and has developed right hip pain. X-ray hip showed comminuted right intertrochanteric fracture. Orthopedics was consulted, Patient underwent open reduction internal fixation.  Postoperative day 2,tolerated well.PT recommended skilled nursing facility for rehab.  Patient seems much improved,  He reports pain is better controlled.  Patient wants to be discharged.  Patient is cleared from orthopedics, advised to take Lovenox 30 mg subcu daily for 6 weeks postoperatively for DVT prophylaxis.  Patient will follow up with Dr. [name] in 2 weeks for wound check.  He was managed for below problems.  Discharge Diagnoses:  Principal Problem:   Closed right hip fracture, initial encounter ([place]) Active Problems:   HTN (hypertension)   HLD (hyperlipidemia)   Chronic kidney disease, stage III (moderate) ([facility])   Insomnia   Epilepsy undetermined as to focal or generalized ([place])  Closedmildly comminuted right intertrochanteric fracture-secondary to mechanical fall Orthopedic surgeon has been consulted. Patient underwent right intramedullary nail placement. Postoperative day 2. Patient tolerated procedure well,reports pain is  better controlled. Pain management: Morphine 2 mg IV every 2 hours as needed for severe pain, hydrocodone 5 mg, 1 to 2 tablets p.o. every 6 hours as needed for moderate pain. Ortho recommended weightbearing as tolerated, antibiotics for 24 hours postop. DVT prophylaxis for 6 weeks. Ifpatient is mobile patient can have aspirin 325 mg daily and if patient is not mobile orthopedic surgeon recommends subcutaneous enoxaparin for 6 weeks for DVT prophylaxis. Cleared from orthopedic to be discharged to rehab.  Hypertension: Blood pressure remains slightly elevated because of the pain Resumed amlodipine 5 mg p.o. daily.  Hyperlipidemia Continuerosuvastatin 20 mg  Multifocal epilepsy seizures-controlled -Per daughter patient last had seizure in October 2021. -Keppra 550 mg p.o. twice daily. -Seizure precautions -Ativan 2 mg IV as needed for seizures, 2 doses ordered ===================================  Here for follow-up today.  Fortunately he isn't having pain.  S/p SNF rehab after hip fx repair.  He is off lovenox in the meantime. Back on aspirin.  He is up and walking with walker throughout the house.  3 steps up into the house.  No ramp but is able to manage with a rail.  He lives on the first story of the house.  No need for internal stairs.  He has modifications in the bathroom.  Daughter is helping some at home.  He graduated from OT.  Still in PT at home.    We talked about preferential use of of 81mg  aspirin instead of 325mg .  No black stools.  No bleeding.  Patient daughter agreed with proceeding with 81 mg.  Daughter can do B12 shot at home.  D/w pt and family.  Due for dose today.  Done at office visit.  Family thought that sertraline helped with his mood.  He agrees.  No ADE on med.  Sertraline was started at [facility].  Daughter wanted to me check his feet.  She trimmed his nails but had possible nail fungus and a prominent vein locally. See exam. No emergent issues, no intervention  needed.  He didn't [facility] the food [facility] but is eating more at home now.   Meds, vitals, and allergies reviewed.   ROS: Per HPI unless specifically indicated in ROS section   GEN: nad, alert and pleasant in conversation. HEENT: NCAT NECK: supple w/o LA CV: rrr. PULM: ctab, no inc wob ABD: soft, +bs EXT: no edema SKIN: no acute rash He still has pain with seated hip flexion.  No pain walking.   Right foot noted for prominent vein there is not irritated on the dorsum.  He has some medial right first nail fungal changes that are mild.  35 minutes were devoted to patient care in this encounter (this includes time spent reviewing the patient's file/history, interviewing and examining the patient, counseling/reviewing plan with patient).

## 2020-12-02 NOTE — Patient Instructions (Addendum)
B12 shot today.  Would continue monthly.   Go to the lab on the way out.   If you have mychart we'll likely use that to update you.    Don't change your meds fow now.  Take care.  Glad to see you.  Plan on recheck in May, sooner if needed.

## 2020-12-04 DIAGNOSIS — M160 Bilateral primary osteoarthritis of hip: Secondary | ICD-10-CM | POA: Diagnosis not present

## 2020-12-04 DIAGNOSIS — M47816 Spondylosis without myelopathy or radiculopathy, lumbar region: Secondary | ICD-10-CM | POA: Diagnosis not present

## 2020-12-04 DIAGNOSIS — S72141D Displaced intertrochanteric fracture of right femur, subsequent encounter for closed fracture with routine healing: Secondary | ICD-10-CM | POA: Diagnosis not present

## 2020-12-04 DIAGNOSIS — N1832 Chronic kidney disease, stage 3b: Secondary | ICD-10-CM | POA: Diagnosis not present

## 2020-12-04 DIAGNOSIS — F32A Depression, unspecified: Secondary | ICD-10-CM | POA: Insufficient documentation

## 2020-12-04 DIAGNOSIS — I129 Hypertensive chronic kidney disease with stage 1 through stage 4 chronic kidney disease, or unspecified chronic kidney disease: Secondary | ICD-10-CM | POA: Diagnosis not present

## 2020-12-04 DIAGNOSIS — G40309 Generalized idiopathic epilepsy and epileptic syndromes, not intractable, without status epilepticus: Secondary | ICD-10-CM | POA: Diagnosis not present

## 2020-12-04 NOTE — Assessment & Plan Note (Signed)
Family thought that sertraline helped with his mood.  He agrees.  No ADE on med.  Sertraline was started at [facility]. Would continue as is.  He agrees.

## 2020-12-04 NOTE — Assessment & Plan Note (Addendum)
He is off lovenox in the meantime. Back on aspirin.  He is up and walking with walker throughout the house.  3 steps up into the house.  No ramp but is able to manage with a rail.  He lives on the first story of the house.  No need for internal stairs.  He has modifications in the bathroom.  Daughter is helping some at home.  He graduated from OT.  Still in PT at home.   Would continue with PT. He will update me as needed. He agrees with plan. See notes on follow-up labs.

## 2020-12-04 NOTE — Assessment & Plan Note (Signed)
Daughter can do B12 shot at home.  D/w pt and family.  Due for dose today.  Done at office visit. Continue as planned with routine replacement otherwise.[name]

## 2020-12-11 DIAGNOSIS — R569 Unspecified convulsions: Secondary | ICD-10-CM | POA: Diagnosis not present

## 2020-12-11 DIAGNOSIS — Z8673 Personal history of transient ischemic attack (TIA), and cerebral infarction without residual deficits: Secondary | ICD-10-CM | POA: Diagnosis not present

## 2020-12-11 DIAGNOSIS — M21371 Foot drop, right foot: Secondary | ICD-10-CM | POA: Diagnosis not present

## 2020-12-11 DIAGNOSIS — G609 Hereditary and idiopathic neuropathy, unspecified: Secondary | ICD-10-CM | POA: Diagnosis not present

## 2020-12-13 DIAGNOSIS — I129 Hypertensive chronic kidney disease with stage 1 through stage 4 chronic kidney disease, or unspecified chronic kidney disease: Secondary | ICD-10-CM | POA: Diagnosis not present

## 2020-12-13 DIAGNOSIS — M160 Bilateral primary osteoarthritis of hip: Secondary | ICD-10-CM | POA: Diagnosis not present

## 2020-12-13 DIAGNOSIS — M47816 Spondylosis without myelopathy or radiculopathy, lumbar region: Secondary | ICD-10-CM | POA: Diagnosis not present

## 2020-12-13 DIAGNOSIS — N1832 Chronic kidney disease, stage 3b: Secondary | ICD-10-CM | POA: Diagnosis not present

## 2020-12-13 DIAGNOSIS — S72141D Displaced intertrochanteric fracture of right femur, subsequent encounter for closed fracture with routine healing: Secondary | ICD-10-CM | POA: Diagnosis not present

## 2020-12-13 DIAGNOSIS — G40309 Generalized idiopathic epilepsy and epileptic syndromes, not intractable, without status epilepticus: Secondary | ICD-10-CM | POA: Diagnosis not present

## 2020-12-16 DIAGNOSIS — M160 Bilateral primary osteoarthritis of hip: Secondary | ICD-10-CM | POA: Diagnosis not present

## 2020-12-16 DIAGNOSIS — G40309 Generalized idiopathic epilepsy and epileptic syndromes, not intractable, without status epilepticus: Secondary | ICD-10-CM | POA: Diagnosis not present

## 2020-12-16 DIAGNOSIS — I129 Hypertensive chronic kidney disease with stage 1 through stage 4 chronic kidney disease, or unspecified chronic kidney disease: Secondary | ICD-10-CM | POA: Diagnosis not present

## 2020-12-16 DIAGNOSIS — Z8781 Personal history of (healed) traumatic fracture: Secondary | ICD-10-CM | POA: Diagnosis not present

## 2020-12-16 DIAGNOSIS — N1832 Chronic kidney disease, stage 3b: Secondary | ICD-10-CM | POA: Diagnosis not present

## 2020-12-16 DIAGNOSIS — M47816 Spondylosis without myelopathy or radiculopathy, lumbar region: Secondary | ICD-10-CM | POA: Diagnosis not present

## 2020-12-16 DIAGNOSIS — S72141D Displaced intertrochanteric fracture of right femur, subsequent encounter for closed fracture with routine healing: Secondary | ICD-10-CM | POA: Diagnosis not present

## 2020-12-16 DIAGNOSIS — M7541 Impingement syndrome of right shoulder: Secondary | ICD-10-CM | POA: Diagnosis not present

## 2020-12-19 ENCOUNTER — Ambulatory Visit: Payer: Medicare Other

## 2020-12-19 NOTE — Chronic Care Management (AMB) (Signed)
  Chronic Care Management   Outreach Note  12/19/2020 Name: Justin Ford MRN: [id number] DOB: September 08, 1930  Referred by: [name], MD Reason for referral : Chronic Care Management ([place], [place])   Successful contact was made with patient's daughter, [name].  HIPAA verified. Daughter states she is unable to complete today's call and wishes to reschedule.   Follow Up Plan: Telephone follow up appointment with care management team member rescheduled for: 12/26/2020.   Quinn Plowman RN,BSN,CCM RN Case Manager [place]  [phone]

## 2020-12-26 ENCOUNTER — Ambulatory Visit (INDEPENDENT_AMBULATORY_CARE_PROVIDER_SITE_OTHER): Payer: Medicare Other

## 2020-12-26 ENCOUNTER — Telehealth: Payer: Medicare Other

## 2020-12-26 DIAGNOSIS — R296 Repeated falls: Secondary | ICD-10-CM

## 2020-12-26 DIAGNOSIS — I10 Essential (primary) hypertension: Secondary | ICD-10-CM | POA: Diagnosis not present

## 2020-12-26 DIAGNOSIS — G40909 Epilepsy, unspecified, not intractable, without status epilepticus: Secondary | ICD-10-CM

## 2020-12-26 NOTE — Patient Instructions (Addendum)
Visit Information:  Thank you for taking the time to speak with me today.   PATIENT GOALS: Goals Addressed            This Visit's Progress   . Decrease occurence of seizures and seizure related injuries   On track    Timeframe:  Long-Range Goal Priority:  Medium Start Date:  11/22/2020                           Expected End Date:    04/03/2021  Follow up: 01/30/2021               Continue to  use handrails on the stairs . Always wear secure fitting  low-heeled or flat shoes or slippers with nonskid soles when ambulating.  [name] keep your cell phone with you always.  . make an emergency alert plan in case I fall (Discuss this with your daughter) . Keep your floors free of clutter.  . Use a nonslip pad with throw rugs, or remove them completely . Continue to use your walker for ambulation as for safety and as advised by your doctor.  [name] Keep frequently used areas in your home well lit.  . Check your blood pressure at least 2-3 times per week.   [name] Keep a seizure diary/log . Take your medications regularly . Don't stop seizure medications abruptly . Try to find out triggers for your seizures (e.g. flashing lights, stress) and try to avoid them.      . Patient will not sustain a fall   On track    Timeframe:  Long-Range Goal Priority:  High Start Date: 11/22/2020                            Expected End Date: 04/03/2021                       Follow Up Date 01/30/2021    .  Continue to  use handrails on the stairs . Always wear secure fitting  low-heeled or flat shoes or slippers with nonskid soles when ambulating.  [name] keep your cell phone with you always.  . make an emergency alert plan in case I fall (Discuss this with your daughter) . Keep your floors free of clutter.  . Use a nonslip pad with throw rugs, or remove them completely . Continue to use your walker for ambulation as for safety and as advised by your doctor.  [name] Keep frequently used areas in your home well lit.  . Check  your blood pressure at least 2-3 times per week.   [name] Keep a seizure diary/log . Take your medications regularly . Don't stop seizure medications abruptly . Try to find out triggers for your seizures (e.g. flashing lights, stress) and try to avoid them. Why is this important?    Most falls happen when it is hard for you to walk safely. Your balance may be off because of an illness. You may have pain in your knees, hip or other joints.   You may be overly tired or taking medicines that make you sleepy. You may not be able to see or hear clearly.   Falls can lead to broken bones, bruises or other injuries.   There are things you can do to help prevent falling.         . Track and Manage My Blood Pressure-Hypertension  On track    Timeframe:  Long-Range Goal Priority:  Medium Start Date: 11/22/2020                            Expected End Date: 04/03/2021                      Follow Up Date 01/30/2021    - Continue to check blood pressure 3 times per week - write blood pressure results in a log or diary and take to provider visits.  - Continue to take your medications as prescribed.  - Attend follow up appointments with providers   Why is this important?    You won't feel high blood pressure, but it can still hurt your blood vessels.   High blood pressure can cause heart or kidney problems. It can also cause a stroke.   Making lifestyle changes like losing a little weight or eating less salt will help.   Checking your blood pressure at home and at different times of the day can help to control blood pressure.   If the doctor prescribes medicine remember to take it the way the doctor ordered.   Call the office if you cannot afford the medicine or if there are questions about it.          Patient's daughter verbalizes understanding of instructions provided today and agrees to view in [place].   The patient has been provided with contact information for the care management  team and has been advised to call with any health related questions or concerns.  The care management team will reach out to the patient again over the next 45 days.   Quinn Plowman RN,BSN,CCM RN Case Manager [place]  [phone]

## 2020-12-26 NOTE — Chronic Care Management (AMB) (Cosign Needed)
Chronic Care Management   CCM RN Visit Note  12/26/2020 Name: Justin Ford MRN: [id number] DOB: March 19, 1930  Subjective: Justin Ford is a 87 y.o. year old male who is a primary care patient of [name], MD. The care management team was consulted for assistance with disease management and care coordination needs.    Engaged with patient's daughter by telephone for follow up visit in response to provider referral for case management and/or care coordination services.   Consent to Services:  The patient was given information about Chronic Care Management services, agreed to services, and gave verbal consent prior to initiation of services.  Please see initial visit note for detailed documentation.   Patient agreed to services and verbal consent obtained.   Assessment: Review of patient past medical history, allergies, medications, health status, including review of consultants reports, laboratory and other test data, was performed as part of comprehensive evaluation and provision of chronic care management services.   SDOH (Social Determinants of Health) assessments and interventions performed:  SDOH Interventions   Flowsheet Row Most Recent Value  SDOH Interventions   Food Insecurity Interventions Intervention Not Indicated  Financial Strain Interventions Intervention Not Indicated  Housing Interventions Intervention Not Indicated  Physical Activity Interventions Other (Comments)  [Patient is status post right hip fracture repair, has completed SNF rehab and home therapy programs. Continues to ambulate with walker.]  Transportation Interventions Intervention Not Indicated       CCM Care Plan  Allergies  Allergen Reactions  . Lisinopril Other (See Comments)    Elevated creatinine-caution on dosing.  . Lipitor [Atorvastatin] Other (See Comments)    Reaction:  Unknown     Outpatient Encounter Medications as of 12/26/2020  Medication Sig  . amLODipine (NORVASC) 5 MG  tablet Take 1 tablet (5 mg total) by mouth daily.  [name] aspirin EC 81 MG tablet Take 81 mg by mouth daily. Swallow whole.  . cyanocobalamin (,VITAMIN B-12,) 1000 MCG/ML injection 1014mcg IM monthly  . Insulin Syringe-Needle U-100 (B-D INSULIN SYRINGE 1CC/25GX1") 25G X 1" 1 ML MISC Use monthly for B12 injection  . levETIRAcetam (KEPPRA) 500 MG tablet Take 1 tablet (500 mg total) by mouth 2 (two) times daily.  . pantoprazole (PROTONIX) 40 MG tablet Take 1 tablet (40 mg total) by mouth daily.  . rosuvastatin (CRESTOR) 20 MG tablet Take 1 tablet by mouth once daily  . sertraline (ZOLOFT) 50 MG tablet Take 1 tablet (50 mg total) by mouth daily.   No facility-administered encounter medications on file as of 12/26/2020.    Patient Active Problem List   Diagnosis Date Noted  . Depression 12/04/2020  . History of hip fracture 11/02/2020  . Epilepsy undetermined as to focal or generalized ([place]) 11/02/2020  . Insomnia 08/07/2020  . Dysarthria 08/07/2020  . Gait abnormality 05/15/2020  . Angiodysplasia of intestinal tract   . Polyp of colon   . Melena   . Angiodysplasia of stomach   . Right foot drop 02/28/2017  . Left leg swelling 10/30/2016  . Gout 10/11/2016  . B12 deficiency 05/21/2016  . Bruising 05/21/2016  . Tremor 03/08/2016  . Hamstring strain 11/05/2015  . Multiple falls 11/05/2015  . CVA (cerebral infarction) 09/23/2015  . Lower back pain 09/12/2015  . Shoulder pain 09/12/2015  . 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 TIA (transient ischemic attack) 06/21/2015  . Hyperglycemia 08/09/2013  . Greater trochanteric bursitis 08/09/2013  . Trigger finger 06/21/2012  . Advance directive on file 03/22/2012  .  Medicare annual wellness visit, subsequent 03/15/2012  . Dupuytren contracture 03/15/2012  . Cough 12/11/2011  . Barrett esophagus 10/12/2011  . Chronic kidney disease, stage III (moderate) ([place]) 10/12/2011  . HTN (hypertension) 09/27/2011  . HLD (hyperlipidemia)  09/27/2011  . GERD (gastroesophageal reflux disease) 09/27/2011    Conditions to be addressed/monitored:HTN and Falls and seizures  Care Plan : Fall Risk  Updates made by Dannielle Karvonen, RN since 12/26/2020 12:00 AM  Problem: Recent fall   Priority: High  Long-Range Goal: Absence of Fall and Fall-Related Injury   Start Date: 11/22/2020  Expected End Date: 04/03/2021  This Visit's Progress: On track  Recent Progress: On track  Priority: High  Current Barriers:  [name] Knowledge Deficits related to fall precautions in patient with recent surgical repair of fractured right hip and impaired mobility and history of seizures.   Daughter reports patients home health physical therapy has been discontinued. She reports patient continues to ambulate with his walker and denies any falls since last outreach with RNCM.  Daughter denies patient having any seizure activity.  Daughter reports patient is doing well. She states he mainly stays home outside of provider follow up visits.   Clinical Goal(s):  [name] Patient and/or caregiver will demonstrate improved adherence to prescribed treatment plan for decreasing falls as evidenced by patient reporting and review of EMR . Patient and/or caregiver will verbalize using fall risk reduction strategies discussed . Patient will not experience additional falls: Daughter states patient has not had any falls since last outreach with RNCM.  [name] patient will not experience a fall related hospital re-admission.  . patient will attend all scheduled medical appointments:  Reminded daughter of patients follow up visit with primary care provider on 03/04/2021 Interventions:  . Collaboration with [name], MD regarding development and update of comprehensive plan of care as evidenced by provider attestation and co-signature . Inter-disciplinary care team collaboration (see longitudinal plan of care) . Provided verbal education re: Potential causes of falls and Fall prevention  strategies . Reviewed medications and discussed potential side effects of medications such as dizziness and frequent urination: Daughter reports patient is taking his medications as prescribed without issues.  . Discussed with patient's daughter risk of falls related to seizures and the importance of taking medications as prescribed.   Patient Goals/Self-Care Activities  . Continue to  use handrails on the stairs . Always wear secure fitting  low-heeled or flat shoes or slippers with nonskid soles when ambulating.  [name] keep your cell phone with you always.  . make an emergency alert plan in case I fall (Discuss this with your daughter) . Keep your floors free of clutter.  . Use a nonslip pad with throw rugs, or remove them completely . Continue to use your walker for ambulation as for safety and as advised by your doctor.  [name] Keep frequently used areas in your home well lit.  . Check your blood pressure at least 2-3 times per week.   [name] Keep a seizure diary/log . Take your medications regularly . Don't stop seizure medications abruptly . Try to find out triggers for your seizures (e.g. flashing lights, stress) and try to avoid them.  Follow Up Plan: The patient has been provided with contact information for the care management team and has been advised to call with any health related questions or concerns.  The care management team will reach out to the patient again over the 45 days.     Care Plan :  Hypertension  Updates made by Dannielle Karvonen, RN since 12/26/2020 12:00 AM  Problem: Disease Progression (Hypertension)   Priority: High  Long-Range Goal: Disease Progression Prevented or Minimized   Start Date: 11/22/2020  Expected End Date: 04/03/2021  This Visit's Progress: On track  Recent Progress: On track  Priority: High  Objective:  . Last practice recorded BP readings:  BP Readings from Last 3 Encounters:  11/04/20 127/76  08/05/20 110/72  07/31/20 104/74   Current Barriers:   [name] Knowledge Deficits related to long term self care management of Hypertension: Daughter continues to assist client with health disease management.  Daughter states patient is doing well. Blood pressures have been stable.   Case Manager Clinical Goal(s):  . patient will verbalize understanding of plan for hypertension management . patient will attend all scheduled medical appointments: Confirmed with daughter patients next follow up with visit with primary care provider is 03/04/2021 . patient will demonstrate improved adherence to prescribed treatment plan for hypertension as evidenced by taking all medications as prescribed, monitoring and recording blood pressure as directed, adhering to low sodium/DASH diet Interventions:  . Collaboration with [name], MD regarding development and update of comprehensive plan of care as evidenced by provider attestation and co-signature . Inter-disciplinary care team collaboration (see longitudinal plan of care) . Evaluation of current treatment plan related to hypertension self management and patient's adherence to plan as established by provider. . Reviewed medications with patient's daughter and discussed importance of compliance . Discussed plans with patient's daughter for ongoing care management follow up and provided patient with direct contact information for care management team . Advised patient's daughter, providing verbal education and rationale, to monitor blood pressure daily and record, calling PCP for findings outside established parameters.  Patient Goals: - Continue to check blood pressure 3 times per week - write blood pressure results in a log or diary and take to provider visits.  - Continue to take your medications as prescribed.  - Attend follow up appointments with providers Self-Care Activities: - Self administers medications as prescribed Attends all scheduled provider appointments Calls provider office for new concerns,  questions, or BP outside discussed parameters Checks BP and records as discussed Follows a low sodium diet/DASH diet Follow Up Plan: The patient has been provided with contact information for the care management team and has been advised to call with any health related questions or concerns.  The care management team will reach out to the patient again over the next 45 days.      Plan:The patient has been provided with contact information for the care management team and has been advised to call with any health related questions or concerns.  and The care management team will reach out to the patient again over the next 45 days. Quinn Plowman RN,BSN,CCM RN Case Manager [facility]  [phone]   I have personally reviewed this encounter including the documentation in this note and have collaborated with the care management provider regarding care management and care coordination activities to include development and update of the comprehensive care plan. I am certifying that I agree with the content of this note and encounter as supervising physician.  Elsie Stain

## 2021-01-28 DIAGNOSIS — M7542 Impingement syndrome of left shoulder: Secondary | ICD-10-CM | POA: Diagnosis not present

## 2021-01-28 DIAGNOSIS — Z8781 Personal history of (healed) traumatic fracture: Secondary | ICD-10-CM | POA: Diagnosis not present

## 2021-01-30 ENCOUNTER — Other Ambulatory Visit: Payer: Self-pay

## 2021-01-30 ENCOUNTER — Ambulatory Visit (INDEPENDENT_AMBULATORY_CARE_PROVIDER_SITE_OTHER): Payer: Medicare Other

## 2021-01-30 DIAGNOSIS — I10 Essential (primary) hypertension: Secondary | ICD-10-CM | POA: Diagnosis not present

## 2021-01-30 DIAGNOSIS — R296 Repeated falls: Secondary | ICD-10-CM

## 2021-01-30 DIAGNOSIS — G40909 Epilepsy, unspecified, not intractable, without status epilepticus: Secondary | ICD-10-CM

## 2021-01-30 NOTE — Patient Instructions (Addendum)
Visit Information:  Thank you for speaking with me regarding Mr. Justin Ford.  PATIENT GOALS: Goals Addressed            This Visit's Progress   . Decrease occurence of seizures and seizure related injuries   On track    Timeframe:  Long-Range Goal Priority:  Medium Start Date:  11/22/2020                           Expected End Date:    04/03/2021  Follow up: 03/07/2021                . Keep a seizure diary/log . Take your medications regularly . Don't stop seizure medications abruptly . Try to find out triggers for your seizures (e.g. flashing lights, stress) and try to avoid them.      . Patient will not sustain a fall   On track    Timeframe:  Long-Range Goal Priority:  High Start Date: 11/22/2020                            Expected End Date: 04/03/2021                       Follow Up Date 03/07/2021 .  Continue to  use handrails on the stairs . Always wear secure fitting  low-heeled or flat shoes or slippers with nonskid soles when ambulating.  [name] keep your cell phone with you always.  . make an emergency alert plan in case I fall (Discuss this with your daughter) . Keep your floors free of clutter.  . Use a nonslip pad with throw rugs, or remove them completely . Continue to use your walker for ambulation as for safety and as advised by your doctor.  [name] Keep frequently used areas in your home well lit.  . Check your blood pressure at least 2-3 times per week.   [name] Keep a seizure diary/log . Take your medications regularly . Don't stop seizure medications abruptly . Continue to follow up with your doctor as recommended.    Why is this important?    Most falls happen when it is hard for you to walk safely. Your balance may be off because of an illness. You may have pain in your knees, hip or other joints.   You may be overly tired or taking medicines that make you sleepy. You may not be able to see or hear clearly.   Falls can lead to broken bones, bruises or other injuries.    There are things you can do to help prevent falling.         . Track and Manage My Blood Pressure-Hypertension   On track    Timeframe:  Long-Range Goal Priority:  Medium Start Date: 11/22/2020                            Expected End Date: 04/03/2021                      Follow Up Date 03/07/2021    - Consider checking your blood pressure at least 1-2 times per week and record results.  - Continue to take your medications as prescribed.  - Attend follow up appointments with providers - Review education article on blood pressure monitoring in My Chart.  Why is this important?    You won't feel high blood pressure, but it can still hurt your blood vessels.   High blood pressure can cause heart or kidney problems. It can also cause a stroke.   Making lifestyle changes like losing a little weight or eating less salt will help.   Checking your blood pressure at home and at different times of the day can help to control blood pressure.   If the doctor prescribes medicine remember to take it the way the doctor ordered.   Call the office if you cannot afford the medicine or if there are questions about it.            How to Take Your Blood Pressure Blood pressure measures how strongly your blood is pressing against the walls of your arteries. Arteries are blood vessels that carry blood from your heart throughout your body. You can take your blood pressure at home with a machine. You may need to check your blood pressure at home:  To check if you have high blood pressure (hypertension).  To check your blood pressure over time.  To make sure your blood pressure medicine is working. Supplies needed:  Blood pressure machine, or monitor.  Dining room chair to sit in.  Table or desk.  Small notebook.  Pencil or pen. How to prepare Avoid these things for 30 minutes before checking your blood pressure:  Having drinks with caffeine in them, such as coffee or  tea.  Drinking alcohol.  Eating.  Smoking.  Exercising. Do these things five minutes before checking your blood pressure:  Go to the bathroom and pee (urinate).  Sit in a dining chair. Do not sit in a soft couch or an armchair.  Be quiet. Do not talk. How to take your blood pressure Follow the instructions that came with your machine. If you have a digital blood pressure monitor, these may be the instructions: 1. Sit up straight. 2. Place your feet on the floor. Do not cross your ankles or legs. 3. Rest your left arm at the level of your heart. You may rest it on a table, desk, or chair. 4. Pull up your shirt sleeve. 5. Wrap the blood pressure cuff around the upper part of your left arm. The cuff should be 1 inch (2.5 cm) above your elbow. It is best to wrap the cuff around bare skin. 6. Fit the cuff snugly around your arm. You should be able to place only one finger between the cuff and your arm. 7. Place the cord so that it rests in the bend of your elbow. 8. Press the power button. 9. Sit quietly while the cuff fills with air and loses air. 10. Write down the numbers on the screen. 11. Wait 2-3 minutes and then repeat steps 1-10.   What do the numbers mean? Two numbers make up your blood pressure. The first number is called systolic pressure. The second is called diastolic pressure. An example of a blood pressure reading is "120 over 80" (or 120/80). If you are an adult and do not have a medical condition, use this guide to find out if your blood pressure is normal: Normal  First number: below 120.  Second number: below 80. Elevated  First number: 120-129.  Second number: below 80. Hypertension stage 1  First number: 130-139.  Second number: 80-89. Hypertension stage 2  First number: 140 or above.  Second number: 59 or above. Your blood pressure is above normal  even if only the top or bottom number is above normal. Follow these instructions at home:  Check  your blood pressure as often as your doctor tells you to.  Check your blood pressure at the same time every day.  Take your monitor to your next doctor's appointment. Your doctor will: ? Make sure you are using it correctly. ? Make sure it is working right.  Make sure you understand what your blood pressure numbers should be.  Tell your doctor if your medicine is causing side effects.  Keep all follow-up visits as told by your doctor. This is important. General tips:  You will need a blood pressure machine, or monitor. Your doctor can suggest a monitor. You can buy one at a drugstore or online. When choosing one: ? Choose one with an arm cuff. ? Choose one that wraps around your upper arm. Only one finger should fit between your arm and the cuff. ? Do not choose one that measures your blood pressure from your wrist or finger. Where to find more information American Heart Association: www.heart.org Contact a doctor if:  Your blood pressure keeps being high. Get help right away if:  Your first blood pressure number is higher than 180.  Your second blood pressure number is higher than 120. Summary  Check your blood pressure at the same time every day.  Avoid caffeine, alcohol, smoking, and exercise for 30 minutes before checking your blood pressure.  Make sure you understand what your blood pressure numbers should be. This information is not intended to replace advice given to you by your health care provider. Make sure you discuss any questions you have with your health care provider. Document Revised: 09/15/2019 Document Reviewed: 09/15/2019 Elsevier Patient Education  2021 [employer] Your Hypertension Hypertension, also called high blood pressure, is when the force of the blood pressing against the walls of the arteries is too strong. Arteries are blood vessels that carry blood from your heart throughout your body. Hypertension forces the heart to work harder to  pump blood and may cause the arteries to become narrow or stiff. Understanding blood pressure readings Your personal target blood pressure may vary depending on your medical conditions, your age, and other factors. A blood pressure reading includes a higher number over a lower number. Ideally, your blood pressure should be below 120/80. You should know that:  The first, or top, number is called the systolic pressure. It is a measure of the pressure in your arteries as your heart beats.  The second, or bottom number, is called the diastolic pressure. It is a measure of the pressure in your arteries as the heart relaxes. Blood pressure is classified into four stages. Based on your blood pressure reading, your health care provider may use the following stages to determine what type of treatment you need, if any. Systolic pressure and diastolic pressure are measured in a unit called mmHg. Normal  Systolic pressure: below 462.  Diastolic pressure: below 80. Elevated  Systolic pressure: 703-500.  Diastolic pressure: below 80. Hypertension stage 1  Systolic pressure: 938-182.  Diastolic pressure: 99-37. Hypertension stage 2  Systolic pressure: 169 or above.  Diastolic pressure: 90 or above. How can this condition affect me? Managing your hypertension is an important responsibility. Over time, hypertension can damage the arteries and decrease blood flow to important parts of the body, including the brain, heart, and kidneys. Having untreated or uncontrolled hypertension can lead to:  A heart attack.  A stroke.  A weakened blood vessel (aneurysm).  Heart failure.  Kidney damage.  Eye damage.  Metabolic syndrome.  Memory and concentration problems.  Vascular dementia. What actions can I take to manage this condition? Hypertension can be managed by making lifestyle changes and possibly by taking medicines. Your health care provider will help you make a plan to bring your blood  pressure within a normal range. Nutrition  Eat a diet that is high in fiber and potassium, and low in salt (sodium), added sugar, and fat. An example eating plan is called the Dietary Approaches to Stop Hypertension (DASH) diet. To eat this way: ? Eat plenty of fresh fruits and vegetables. Try to fill one-half of your plate at each meal with fruits and vegetables. ? Eat whole grains, such as whole-wheat pasta, brown rice, or whole-grain bread. Fill about one-fourth of your plate with whole grains. ? Eat low-fat dairy products. ? Avoid fatty cuts of meat, processed or cured meats, and poultry with skin. Fill about one-fourth of your plate with lean proteins such as fish, chicken without skin, beans, eggs, and tofu. ? Avoid pre-made and processed foods. These tend to be higher in sodium, added sugar, and fat.  Reduce your daily sodium intake. Most people with hypertension should eat less than 1,500 mg of sodium a day.   Lifestyle  Work with your health care provider to maintain a healthy body weight or to lose weight. Ask what an ideal weight is for you.  Get at least 30 minutes of exercise that causes your heart to beat faster (aerobic exercise) most days of the week. Activities may include walking, swimming, or biking.  Include exercise to strengthen your muscles (resistance exercise), such as weight lifting, as part of your weekly exercise routine. Try to do these types of exercises for 30 minutes at least 3 days a week.  Do not use any products that contain nicotine or tobacco, such as cigarettes, e-cigarettes, and chewing tobacco. If you need help quitting, ask your health care provider.  Control any long-term (chronic) conditions you have, such as high cholesterol or diabetes.  Identify your sources of stress and find ways to manage stress. This may include meditation, deep breathing, or making time for fun activities.   Alcohol use  Do not drink alcohol if: ? Your health care provider  tells you not to drink. ? You are pregnant, may be pregnant, or are planning to become pregnant.  If you drink alcohol: ? Limit how much you use to:  0-1 drink a day for women.  0-2 drinks a day for men. ? Be aware of how much alcohol is in your drink. In the [place] one drink equals one 12 oz bottle of beer (355 mL), one 5 oz glass of wine (148 mL), or one 1 oz glass of hard liquor (44 mL). Medicines Your health care provider may prescribe medicine if lifestyle changes are not enough to get your blood pressure under control and if:  Your systolic blood pressure is 130 or higher.  Your diastolic blood pressure is 80 or higher. Take medicines only as told by your health care provider. Follow the directions carefully. Blood pressure medicines must be taken as told by your health care provider. The medicine does not work as well when you skip doses. Skipping doses also puts you at risk for problems. Monitoring Before you monitor your blood pressure:  Do not smoke, drink caffeinated beverages, or exercise within 30 minutes before taking a measurement.  Use  the bathroom and empty your bladder (urinate).  Sit quietly for at least 5 minutes before taking measurements. Monitor your blood pressure at home as told by your health care provider. To do this:  Sit with your back straight and supported.  Place your feet flat on the floor. Do not cross your legs.  Support your arm on a flat surface, such as a table. Make sure your upper arm is at heart level.  Each time you measure, take two or three readings one minute apart and record the results. You may also need to have your blood pressure checked regularly by your health care provider.   General information  Talk with your health care provider about your diet, exercise habits, and other lifestyle factors that may be contributing to hypertension.  Review all the medicines you take with your health care provider because there may be side  effects or interactions.  Keep all visits as told by your health care provider. Your health care provider can help you create and adjust your plan for managing your high blood pressure. Where to find more information  National Heart, Lung, and Blood Institute: https://wilson-eaton.com/  American Heart Association: www.heart.org Contact a health care provider if:  You think you are having a reaction to medicines you have taken.  You have repeated (recurrent) headaches.  You feel dizzy.  You have swelling in your ankles.  You have trouble with your vision. Get help right away if:  You develop a severe headache or confusion.  You have unusual weakness or numbness, or you feel faint.  You have severe pain in your chest or abdomen.  You vomit repeatedly.  You have trouble breathing. These symptoms may represent a serious problem that is an emergency. Do not wait to see if the symptoms will go away. Get medical help right away. Call your local emergency services (911 in the [place] Do not drive yourself to the hospital. Summary  Hypertension is when the force of blood pumping through your arteries is too strong. If this condition is not controlled, it may put you at risk for serious complications.  Your personal target blood pressure may vary depending on your medical conditions, your age, and other factors. For most people, a normal blood pressure is less than 120/80.  Hypertension is managed by lifestyle changes, medicines, or both.  Lifestyle changes to help manage hypertension include losing weight, eating a healthy, low-sodium diet, exercising more, stopping smoking, and limiting alcohol. This information is not intended to replace advice given to you by your health care provider. Make sure you discuss any questions you have with your health care provider. Document Revised: 10/27/2019 Document Reviewed: 08/22/2019 Elsevier Patient Education  2021 [employer].    Patient's  daughter verbalizes understanding of instructions provided today and agrees to view in [place].   The patient has been provided with contact information for the care management team and has been advised to call with any health related questions or concerns.  The care management team will reach out to the patient again over the next 45 days.   Quinn Plowman RN,BSN,CCM RN Case Manager Man  [phone]

## 2021-01-30 NOTE — Chronic Care Management (AMB) (Signed)
Chronic Care Management   CCM RN Visit Note  01/30/2021 Name: Justin Ford MRN: [id number] DOB: Aug 14, 1930  Subjective: Justin Ford is a 87 y.o. year old male who is a primary care patient of [name], MD. The care management team was consulted for assistance with disease management and care coordination needs.    Engaged with patient by telephone for follow up visit in response to provider referral for case management and/or care coordination services.   Consent to Services:  The patient was given information about Chronic Care Management services, agreed to services, and gave verbal consent prior to initiation of services.  Please see initial visit note for detailed documentation.   Patient agreed to services and verbal consent obtained.   Assessment: Review of patient past medical history, allergies, medications, health status, including review of consultants reports, laboratory and other test data, was performed as part of comprehensive evaluation and provision of chronic care management services.   SDOH (Social Determinants of Health) assessments and interventions performed:    CCM Care Plan  Allergies  Allergen Reactions  . Lisinopril Other (See Comments)    Elevated creatinine-caution on dosing.  . Lipitor [Atorvastatin] Other (See Comments)    Reaction:  Unknown     Outpatient Encounter Medications as of 01/30/2021  Medication Sig  . amLODipine (NORVASC) 5 MG tablet Take 1 tablet (5 mg total) by mouth daily.  [name] aspirin EC 81 MG tablet Take 81 mg by mouth daily. Swallow whole.  . cyanocobalamin (,VITAMIN B-12,) 1000 MCG/ML injection 1022mcg IM monthly  . Insulin Syringe-Needle U-100 (B-D INSULIN SYRINGE 1CC/25GX1") 25G X 1" 1 ML MISC Use monthly for B12 injection  . levETIRAcetam (KEPPRA) 500 MG tablet Take 1 tablet (500 mg total) by mouth 2 (two) times daily.  . pantoprazole (PROTONIX) 40 MG tablet Take 1 tablet (40 mg total) by mouth daily.  . rosuvastatin  (CRESTOR) 20 MG tablet Take 1 tablet by mouth once daily  . sertraline (ZOLOFT) 50 MG tablet Take 1 tablet (50 mg total) by mouth daily.   No facility-administered encounter medications on file as of 01/30/2021.    Patient Active Problem List   Diagnosis Date Noted  . Depression 12/04/2020  . History of hip fracture 11/02/2020  . Epilepsy undetermined as to focal or generalized ([place]) 11/02/2020  . Insomnia 08/07/2020  . Dysarthria 08/07/2020  . Gait abnormality 05/15/2020  . Angiodysplasia of intestinal tract   . Polyp of colon   . Melena   . Angiodysplasia of stomach   . Right foot drop 02/28/2017  . Left leg swelling 10/30/2016  . Gout 10/11/2016  . B12 deficiency 05/21/2016  . Bruising 05/21/2016  . Tremor 03/08/2016  . Hamstring strain 11/05/2015  . Multiple falls 11/05/2015  . CVA (cerebral infarction) 09/23/2015  . Lower back pain 09/12/2015  . Shoulder pain 09/12/2015  . 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 TIA (transient ischemic attack) 06/21/2015  . Hyperglycemia 08/09/2013  . Greater trochanteric bursitis 08/09/2013  . Trigger finger 06/21/2012  . Advance directive on file 03/22/2012  . Medicare annual wellness visit, subsequent 03/15/2012  . Dupuytren contracture 03/15/2012  . Cough 12/11/2011  . Barrett esophagus 10/12/2011  . Chronic kidney disease, stage III (moderate) ([place]) 10/12/2011  . HTN (hypertension) 09/27/2011  . HLD (hyperlipidemia) 09/27/2011  . GERD (gastroesophageal reflux disease) 09/27/2011    Conditions to be addressed/monitored:HTN and Falls, Seizures  Care Plan : Fall Risk  Updates made by Dannielle Karvonen, RN  since 01/30/2021 12:00 AM  Problem: Recent fall   Priority: High  Long-Range Goal: Absence of Fall and Fall-Related Injury   Start Date: 11/22/2020  Expected End Date: 04/03/2021  Recent Progress: On track  Priority: High  Current Barriers:  [name] Knowledge Deficits related to fall precautions in patient with recent  surgical repair of fractured right hip and impaired mobility and history of seizures.   Daughter states patient is doing well. She reports patient has not had any falls or seizure activity.    Clinical Goal(s):  [name] Patient and/or caregiver will demonstrate improved adherence to prescribed treatment plan for decreasing falls as evidenced by patient reporting and review of EMR . Patient and/or caregiver will verbalize using fall risk reduction strategies discussed . Patient will not experience additional falls  . patient will not experience a fall related hospital re-admission.  . patient will attend all scheduled medical appointments:  Reminded daughter of patients follow up visit with primary care provider on 03/04/2021 Interventions:  . Collaboration with [name], MD regarding development and update of comprehensive plan of care as evidenced by provider attestation and co-signature . Inter-disciplinary care team collaboration (see longitudinal plan of care) as needed.  . Discussed Potential causes of falls and Fall prevention strategies . Reviewed medications: Daughter reports patient continues to take his medications as prescribed without issues.  . Discussed with patient's daughter risk of falls related to seizures and the importance of taking medications as prescribed.  . Discussed importance of ongoing follow up with provider.  Patient Goals/Self-Care Activities  . Continue to  use handrails on the stairs . Always wear secure fitting  low-heeled or flat shoes or slippers with nonskid soles when ambulating.  [name] keep your cell phone with you always.  . make an emergency alert plan in case I fall (Discuss this with your daughter) . Keep your floors free of clutter.  . Use a nonslip pad with throw rugs, or remove them completely . Continue to use your walker for ambulation as for safety and as advised by your doctor.  [name] Keep frequently used areas in your home well lit.  . Check your  blood pressure at least 2-3 times per week.   [name] Keep a seizure diary/log . Take your medications regularly . Don't stop seizure medications abruptly . Continue to follow up with your doctor as recommended.  . Try to find out triggers for your seizures (e.g. flashing lights, stress) and try to avoid them. Follow Up Plan: The patient has been provided with contact information for the care management team and has been advised to call with any health related questions or concerns.  The care management team will reach out to the patient again over the 45 days.    Care Plan : Hypertension  Updates made by Dannielle Karvonen, RN since 01/30/2021 12:00 AM  Problem: Disease Progression (Hypertension)   Priority: Medium  Long-Range Goal: Disease Progression Prevented or Minimized   Start Date: 11/22/2020  Expected End Date: 04/03/2021  This Visit's Progress: On track  Recent Progress: On track  Priority: Medium  Objective:  . Last practice recorded BP readings:  BP Readings from Last 3 Encounters:  11/04/20 127/76  08/05/20 110/72  07/31/20 104/74   Current Barriers:  [name] Knowledge Deficits related to long term self care management of Hypertension: Daughter continues to assist client with health disease management.  Daughter states patient is doing well. Daughter states patient does not check his blood pressure at  home.    Case Manager Clinical Goal(s):  . patient will verbalize understanding of plan for hypertension management . patient will attend all scheduled medical appointments: Confirmed with daughter patients next follow up with visit with primary care provider is 03/04/2021 . patient will demonstrate improved adherence to prescribed treatment plan for hypertension as evidenced by taking all medications as prescribed, monitoring and recording blood pressure as directed, adhering to low sodium/DASH diet Interventions:  . Collaboration with [name], MD regarding development and update  of comprehensive plan of care as evidenced by provider attestation and co-signature . Inter-disciplinary care team collaboration (see longitudinal plan of care) . Evaluation of current treatment plan related to hypertension self management and patient's adherence to plan as established by provider. Daughter states patient continues to take his blood pressure medications and follows up with his doctor regularly.  . Reviewed medications with patient's daughter and discussed importance of compliance . Discussed plans with patient's daughter for ongoing care management follow up and provided patient with direct contact information for care management team parameters.  . Provided patient written education information on importance of monitoring blood pressure.  Patient Goals: - Consider checking your blood pressure at least 1-2 times per week and record results.  - Continue to take your medications as prescribed.  - Attend follow up appointments with providers - Review education article on blood pressure monitoring in My Chart.  Self-Care Activities: - Self administers medications as prescribed Attends all scheduled provider appointments Calls provider office for new concerns, questions, or BP outside discussed parameters Follows a low sodium diet/DASH diet Follow Up Plan: The patient has been provided with contact information for the care management team and has been advised to call with any health related questions or concerns.  The care management team will reach out to the patient again over the next 45 days.      Plan:The patient has been provided with contact information for the care management team and has been advised to call with any health related questions or concerns.  and The care management team will reach out to the patient again over the next 45 days.  Quinn Plowman RN,BSN,CCM RN Case Manager [place]  [phone]

## 2021-02-22 DIAGNOSIS — Z8673 Personal history of transient ischemic attack (TIA), and cerebral infarction without residual deficits: Secondary | ICD-10-CM | POA: Diagnosis not present

## 2021-02-22 DIAGNOSIS — G451 Carotid artery syndrome (hemispheric): Secondary | ICD-10-CM | POA: Diagnosis not present

## 2021-02-22 DIAGNOSIS — Z87891 Personal history of nicotine dependence: Secondary | ICD-10-CM | POA: Diagnosis not present

## 2021-02-22 DIAGNOSIS — I4510 Unspecified right bundle-branch block: Secondary | ICD-10-CM | POA: Diagnosis not present

## 2021-02-22 DIAGNOSIS — Z6823 Body mass index (BMI) 23.0-23.9, adult: Secondary | ICD-10-CM | POA: Diagnosis not present

## 2021-02-22 DIAGNOSIS — R079 Chest pain, unspecified: Secondary | ICD-10-CM | POA: Diagnosis not present

## 2021-02-22 DIAGNOSIS — R42 Dizziness and giddiness: Secondary | ICD-10-CM | POA: Diagnosis not present

## 2021-02-22 DIAGNOSIS — R569 Unspecified convulsions: Secondary | ICD-10-CM | POA: Diagnosis not present

## 2021-02-22 DIAGNOSIS — D649 Anemia, unspecified: Secondary | ICD-10-CM | POA: Diagnosis not present

## 2021-02-22 DIAGNOSIS — K625 Hemorrhage of anus and rectum: Secondary | ICD-10-CM | POA: Diagnosis not present

## 2021-02-22 DIAGNOSIS — Z8669 Personal history of other diseases of the nervous system and sense organs: Secondary | ICD-10-CM | POA: Diagnosis not present

## 2021-02-22 DIAGNOSIS — Z7982 Long term (current) use of aspirin: Secondary | ICD-10-CM | POA: Diagnosis not present

## 2021-03-04 ENCOUNTER — Other Ambulatory Visit: Payer: Self-pay

## 2021-03-04 ENCOUNTER — Encounter: Payer: Self-pay | Admitting: Family Medicine

## 2021-03-04 ENCOUNTER — Ambulatory Visit (INDEPENDENT_AMBULATORY_CARE_PROVIDER_SITE_OTHER): Payer: Medicare Other | Admitting: Family Medicine

## 2021-03-04 DIAGNOSIS — E538 Deficiency of other specified B group vitamins: Secondary | ICD-10-CM | POA: Diagnosis not present

## 2021-03-04 DIAGNOSIS — G40909 Epilepsy, unspecified, not intractable, without status epilepticus: Secondary | ICD-10-CM

## 2021-03-04 DIAGNOSIS — I10 Essential (primary) hypertension: Secondary | ICD-10-CM | POA: Diagnosis not present

## 2021-03-04 DIAGNOSIS — N183 Chronic kidney disease, stage 3 unspecified: Secondary | ICD-10-CM | POA: Diagnosis not present

## 2021-03-04 DIAGNOSIS — R42 Dizziness and giddiness: Secondary | ICD-10-CM | POA: Diagnosis not present

## 2021-03-04 MED ORDER — LEVETIRACETAM 500 MG PO TABS: 500.0000 mg | ORAL_TABLET | Freq: Two times a day (BID) | ORAL | Status: DC

## 2021-03-04 MED ORDER — AMLODIPINE BESYLATE 5 MG PO TABS: 2.5000 mg | ORAL_TABLET | Freq: Every day | ORAL | Status: DC

## 2021-03-04 NOTE — Progress Notes (Signed)
This visit occurred during the SARS-CoV-2 public health emergency.  Safety protocols were in place, including screening questions prior to the visit, additional usage of staff PPE, and extensive cleaning of exam room while observing appropriate contact time as indicated for disinfecting solutions.  Recent ER eval for dizziness with fall.  hgb 11.9 and Cr improved to 1.5 on recent labs.   Discussed not rechecking his labs today, given his recent results.  Discussed vertigo symptoms.  He had room spinning on laying down.  He can get lightheaded on standing in the AM, but this is a different sensation.  It quickly resolves.  Cautions d/w pt.  No CP with walking.  Not SOB.    No SZ in the last few months, none in 2022.    He still on B12 replacement at baseline.   Meds, vitals, and allergies reviewed.   ROS: Per HPI unless specifically indicated in ROS section   GEN: nad, alert and oriented HEENT: ncat, TM wnl B NECK: supple w/o LA CV: rrr. PULM: ctab, no inc wob ABD: soft, +bs EXT: no edema SKIN: Well-perfused. Walking with symmetric gait with Rollator.

## 2021-03-04 NOTE — Patient Instructions (Addendum)
Try cutting the amlodipine back to 1/2 tab daily and see if you feel better getting out of the bed. Update me as needed.   Try the beside exercises for vertigo when you have help at home.   I would keep doing the B12 shots monthly.   Take care.  Glad to see you.

## 2021-03-05 ENCOUNTER — Other Ambulatory Visit: Payer: Self-pay | Admitting: Family Medicine

## 2021-03-05 DIAGNOSIS — R42 Dizziness and giddiness: Secondary | ICD-10-CM | POA: Insufficient documentation

## 2021-03-05 NOTE — Assessment & Plan Note (Signed)
Creatinine improved to 1.5 on recent labs.  Discussed with patient.  Would continue as is.  No change in medications at this point.

## 2021-03-05 NOTE — Assessment & Plan Note (Signed)
Recurrent, noted upon laying down.  It is reproducible per patient report.  Discussed options.  Reasonable to try home vestibular exercises with supervision.  Demonstrated for patient.  He and daughter understood.  They can update me as needed.  Routine cautions given to patient.

## 2021-03-05 NOTE — Assessment & Plan Note (Signed)
Compliant with Keppra.  Fortunately no events in the meantime.  Continue Keppra as is.

## 2021-03-05 NOTE — Assessment & Plan Note (Signed)
Discussed decreasing his amlodipine down to 2.5 mg a day.  I do not want him hypotensive orthostatic on standing.  Routine cautions given to patient.  He agrees.  Patient/daughter can update me as needed.

## 2021-03-05 NOTE — Assessment & Plan Note (Signed)
Continue replacement with monthly injection.  We can recheck later on.

## 2021-03-07 ENCOUNTER — Ambulatory Visit (INDEPENDENT_AMBULATORY_CARE_PROVIDER_SITE_OTHER): Payer: Medicare Other

## 2021-03-07 DIAGNOSIS — I10 Essential (primary) hypertension: Secondary | ICD-10-CM | POA: Diagnosis not present

## 2021-03-07 DIAGNOSIS — R296 Repeated falls: Secondary | ICD-10-CM

## 2021-03-07 NOTE — Patient Instructions (Signed)
Visit Information:  Thank you for taking the time to speak with me today.   PATIENT GOALS: Goals Addressed            This Visit's Progress   . Patient will not sustain a fall   On track    Timeframe:  Long-Range Goal Priority:  High Start Date: 11/22/2020                            Expected End Date: 8/31//2022                      Follow Up Date 04/22/2021  . Continue to  use handrails on the stairs . Always wear secure fitting  low-heeled or flat shoes or slippers with nonskid soles when ambulating.  [name] keep your cell phone with you always.  . make an emergency alert plan in case I fall (Discuss this with your daughter) . Keep your floors free of clutter.  . Use a nonslip pad with throw rugs, or remove them completely . Continue to use your walker for ambulation as for safety and as advised by your doctor.  [name] Keep frequently used areas in your home well lit.  . Check your blood pressure at least 2-3 times per week.   . Take your medications regularly . Don't stop seizure medications abruptly . Continue to follow up with your doctor as recommended.  . Stay hydrated . Stand up slowly, avoid overheating, and avoid crossing your legs when sitting.    Why is this important?    Most falls happen when it is hard for you to walk safely. Your balance may be off because of an illness. You may have pain in your knees, hip or other joints.   You may be overly tired or taking medicines that make you sleepy. You may not be able to see or hear clearly.   Falls can lead to broken bones, bruises or other injuries.   There are things you can do to help prevent falling.         . Track and Manage My Blood Pressure-Hypertension   On track    Timeframe:  Long-Range Goal Priority:  Medium Start Date: 11/22/2020                            Expected End Date: 06/04/2021                      Follow Up Date 04/22/2021    - Consider checking your blood pressure at least 1-2 times per week and  record results.  - Continue to take your medications as prescribed.  - Continue attending follow up appointments with providers - Keep yourself hydrated by drinking enough water - Stand up slowly, avoid getting overheated, and avoid crossing your legs when sitting.    Why is this important?    You won't feel high blood pressure, but it can still hurt your blood vessels.   High blood pressure can cause heart or kidney problems. It can also cause a stroke.   Making lifestyle changes like losing a little weight or eating less salt will help.   Checking your blood pressure at home and at different times of the day can help to control blood pressure.   If the doctor prescribes medicine remember to take it the way the doctor ordered.  Call the office if you cannot afford the medicine or if there are questions about it.            Patient verbalizes understanding of instructions provided today and agrees to view in [place].   The patient has been provided with contact information for the care management team and has been advised to call with any health related questions or concerns.  The care management team will reach out to the patient again over the next 45 days.   Quinn Plowman RN,BSN,CCM RN Case Manager [place]  [phone]

## 2021-03-07 NOTE — Chronic Care Management (AMB) (Signed)
Chronic Care Management   CCM RN Visit Note  03/07/2021 Name: Justin Ford MRN: [id number] DOB: 1929-10-21  Subjective: Justin Ford is a 87 y.o. year old male who is a primary care patient of Justin Ghent, MD. The care management team was consulted for assistance with disease management and care coordination needs.    Engaged with patient's daughter/ designated party release by telephone for patient  follow up visit in response to provider referral for case management and/or care coordination services.   Consent to Services:  The patient was given information about Chronic Care Management services, agreed to services, and gave verbal consent prior to initiation of services.  Please see initial visit note for detailed documentation.   Patient agreed to services and verbal consent obtained.   Assessment: Review of patient past medical history, allergies, medications, health status, including review of consultants reports, laboratory and other test data, was performed as part of comprehensive evaluation and provision of chronic care management services.   SDOH (Social Determinants of Health) assessments and interventions performed:    CCM Care Plan  Allergies  Allergen Reactions  . Lisinopril Other (See Comments)    Elevated creatinine-caution on dosing.  . Lipitor [Atorvastatin] Other (See Comments)    Reaction:  Unknown     Outpatient Encounter Medications as of 03/07/2021  Medication Sig  . amLODipine (NORVASC) 5 MG tablet Take 0.5 tablets (2.5 mg total) by mouth daily.  [name] aspirin EC 81 MG tablet Take 81 mg by mouth daily. Swallow whole.  . cyanocobalamin (,VITAMIN B-12,) 1000 MCG/ML injection 1056mcg IM monthly  . levETIRAcetam (KEPPRA) 500 MG tablet Take 1 tablet (500 mg total) by mouth 2 (two) times daily.  . pantoprazole (PROTONIX) 40 MG tablet Take 1 tablet (40 mg total) by mouth daily.  . rosuvastatin (CRESTOR) 20 MG tablet Take 1 tablet by mouth once daily  .  sertraline (ZOLOFT) 50 MG tablet Take 1 tablet (50 mg total) by mouth daily.  . Insulin Syringe-Needle U-100 (B-D INSULIN SYRINGE 1CC/25GX1") 25G X 1" 1 ML MISC Use monthly for B12 injection   No facility-administered encounter medications on file as of 03/07/2021.    Patient Active Problem List   Diagnosis Date Noted  . Vertigo 03/05/2021  . Depression 12/04/2020  . History of hip fracture 11/02/2020  . Epilepsy undetermined as to focal or generalized ([place]) 11/02/2020  . Insomnia 08/07/2020  . Dysarthria 08/07/2020  . Gait abnormality 05/15/2020  . Angiodysplasia of intestinal tract   . Polyp of colon   . Melena   . Angiodysplasia of stomach   . Right foot drop 02/28/2017  . Left leg swelling 10/30/2016  . Gout 10/11/2016  . B12 deficiency 05/21/2016  . Bruising 05/21/2016  . Tremor 03/08/2016  . Hamstring strain 11/05/2015  . Multiple falls 11/05/2015  . CVA (cerebral infarction) 09/23/2015  . Lower back pain 09/12/2015  . Shoulder pain 09/12/2015  . 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 TIA (transient ischemic attack) 06/21/2015  . Hyperglycemia 08/09/2013  . Greater trochanteric bursitis 08/09/2013  . Trigger finger 06/21/2012  . Advance directive on file 03/22/2012  . Medicare annual wellness visit, subsequent 03/15/2012  . Dupuytren contracture 03/15/2012  . Cough 12/11/2011  . Barrett esophagus 10/12/2011  . Chronic kidney disease, stage III (moderate) ([place]) 10/12/2011  . HTN (hypertension) 09/27/2011  . HLD (hyperlipidemia) 09/27/2011  . GERD (gastroesophageal reflux disease) 09/27/2011    Conditions to be addressed/monitored:HTN and Falls  Care Plan : Fall  Risk  Updates made by Justin Karvonen, RN since 03/07/2021 12:00 AM  Problem: Recent fall   Priority: High  Long-Range Goal: Absence of Fall and Fall-Related Injury   Start Date: 11/22/2020  Expected End Date: 06/04/2021  This Visit's Progress: On track  Recent Progress: On track  Priority:  High  Current Barriers:  [name] Knowledge Deficits related to fall precautions in patient with recent surgical repair of fractured right hip and impaired mobility and history of seizures.  Daughter/ designated party release, Justin Ford contacted for patient follow up assessment. Daughter Ford patient went to ED on 02/22/21 due to dizziness. She Ford testing showed no findings and patient diagnosed with Vertigo. Daughter reports patient was seen for follow up by his primary care provider on 03/04/2021 who advised him to start taking 1/2 tablet of amlodipine. Daughter Ford patient has noticed decrease in dizziness since medication adjustment. Daughter denies any falls since new onset of dizziness.   Clinical Goal(s):  [name] Patient and/or caregiver will demonstrate improved adherence to prescribed treatment plan for decreasing falls as evidenced by patient reporting and review of EMR . Patient and/or caregiver will verbalize using fall risk reduction strategies discussed . Patient will not experience additional falls  . patient will not experience a fall related hospital re-admission.  . patient will attend all scheduled medical appointments Interventions:  . Collaboration with Justin Ghent, MD regarding development and update of comprehensive plan of care as evidenced by provider attestation and co-signature . Inter-disciplinary care team collaboration (see longitudinal plan of care) as needed.  . Reviewed fall prevention strategies with daughter . Reviewed medications:  Daughter reports patient's provider adjusted his amlodipine to 1/2 tablet daily.  . Discussed with patient's daughter risk of falls related to seizures and the importance of taking medications as prescribed.  . Discussed importance of ongoing follow up with provider. Daughter reports patient's next primary care provider visit is October 2022.  Patient Goals/Self-Care Activities  . Continue to  use handrails on the stairs . Always  wear secure fitting  low-heeled or flat shoes or slippers with nonskid soles when ambulating.  [name] keep your cell phone with you always.  . make an emergency alert plan in case I fall (Discuss this with your daughter) . Keep your floors free of clutter.  . Use a nonslip pad with throw rugs, or remove them completely . Continue to use your walker for ambulation as for safety and as advised by your doctor.  [name] Keep frequently used areas in your home well lit.  . Check your blood pressure at least 2-3 times per week.   . Take your medications regularly . Don't stop seizure medications abruptly . Continue to follow up with your doctor as recommended.  . Stay hydrated . Stand up slowly, avoid overheating, and avoid crossing your legs when sitting.  Follow Up Plan: The patient has been provided with contact information for the care management team and has been advised to call with any health related questions or concerns.  The care management team will reach out to the patient again over the 45 days.     Care Plan : Hypertension  Updates made by Justin Karvonen, RN since 03/07/2021 12:00 AM  Problem: Disease Progression (Hypertension)   Priority: Medium  Long-Range Goal: Disease Progression Prevented or Minimized   Start Date: 11/22/2020  Expected End Date: 06/04/2021  This Visit's Progress: On track  Recent Progress: On track  Priority: Medium  Objective:  .  Last practice recorded BP readings:  BP Readings from Last 3 Encounters:  03/04/21 132/82  12/02/20 122/78  11/04/20 127/76   Current Barriers: Daughter/ designated party release, Justin Ford contacted for patient follow up assessment. Daughter Ford patient went to ED on 02/22/21 due to dizziness. She Ford testing showed no findings and patient diagnosed with Vertigo. Daughter reports patient was seen for follow up by his primary care provider on 03/04/2021 who advised him to start taking 1/2 tablet of amlodipine. Daughter Ford  patient has noticed decrease in dizziness since medication adjustment. Daughter reports patient is not monitoring blood pressure at home.  . Knowledge Deficits related to long term self care management of Hypertension   Case Manager Clinical Goal(s):  . patient will verbalize understanding of plan for hypertension management . patient will attend all scheduled medical appointments: .  . patient will demonstrate improved adherence to prescribed treatment plan for hypertension as evidenced by taking all medications as prescribed, monitoring and recording blood pressure as directed, adhering to low sodium/DASH diet Interventions:  . Collaboration with Justin Ghent, MD regarding development and update of comprehensive plan of care as evidenced by provider attestation and co-signature . Inter-disciplinary care team collaboration (see longitudinal plan of care) . Evaluation of current treatment plan related to hypertension self management and patient's adherence to plan as established by provider.  . Reviewed medications with patient's daughter and discussed importance of compliance . Discussed plans with patient's daughter for ongoing care management follow up and provided patient with direct contact information for care management team parameters.  . Discussed upcoming patient provider appointments: Confirmed with daughter patients next follow up with visit with primary care provider is October 2022 Self-Care Activities: - Self administers medications as prescribed Attends all scheduled provider appointments Calls provider office for new concerns, questions, or BP outside discussed parameters Follows a low sodium diet/DASH diet Patient Goals: - Consider checking your blood pressure at least 1-2 times per week and record results.  - Continue to take your medications as prescribed.  - Continue attending follow up appointments with providers - Keep yourself hydrated by drinking enough water -  Stand up slowly, avoid getting overheated, and avoid crossing your legs when sitting.  Follow Up Plan: The patient has been provided with contact information for the care management team and has been advised to call with any health related questions or concerns.  The care management team will reach out to the patient again over the next 45 days.       Plan:The patient has been provided with contact information for the care management team and has been advised to call with any health related questions or concerns.  and The care management team will reach out to the patient again over the next 45 days. Quinn Plowman RN,BSN,CCM RN Case Manager [place]  [phone]

## 2021-03-11 DIAGNOSIS — D2261 Melanocytic nevi of right upper limb, including shoulder: Secondary | ICD-10-CM | POA: Diagnosis not present

## 2021-03-11 DIAGNOSIS — Z85828 Personal history of other malignant neoplasm of skin: Secondary | ICD-10-CM | POA: Diagnosis not present

## 2021-03-11 DIAGNOSIS — L821 Other seborrheic keratosis: Secondary | ICD-10-CM | POA: Diagnosis not present

## 2021-03-11 DIAGNOSIS — D2262 Melanocytic nevi of left upper limb, including shoulder: Secondary | ICD-10-CM | POA: Diagnosis not present

## 2021-03-11 DIAGNOSIS — D2271 Melanocytic nevi of right lower limb, including hip: Secondary | ICD-10-CM | POA: Diagnosis not present

## 2021-03-11 DIAGNOSIS — X32XXXA Exposure to sunlight, initial encounter: Secondary | ICD-10-CM | POA: Diagnosis not present

## 2021-03-11 DIAGNOSIS — L570 Actinic keratosis: Secondary | ICD-10-CM | POA: Diagnosis not present

## 2021-03-11 DIAGNOSIS — D225 Melanocytic nevi of trunk: Secondary | ICD-10-CM | POA: Diagnosis not present

## 2021-03-24 ENCOUNTER — Telehealth: Payer: Self-pay

## 2021-03-24 MED ORDER — PANTOPRAZOLE SODIUM 40 MG PO TBEC: 40.0000 mg | DELAYED_RELEASE_TABLET | Freq: Every day | ORAL | 1 refills | Status: DC

## 2021-03-24 NOTE — Telephone Encounter (Signed)
Pt's daughter called asking for a refill of pantoprazole be sent to [facility]. For some reason when she calls, the automated system says he cannot refill it until September and he does not have any medication. I have sent in refills at her request.

## 2021-03-25 DIAGNOSIS — M25512 Pain in left shoulder: Secondary | ICD-10-CM | POA: Diagnosis not present

## 2021-03-25 DIAGNOSIS — M7542 Impingement syndrome of left shoulder: Secondary | ICD-10-CM | POA: Diagnosis not present

## 2021-04-02 DIAGNOSIS — M25512 Pain in left shoulder: Secondary | ICD-10-CM | POA: Diagnosis not present

## 2021-04-02 DIAGNOSIS — S46112A Strain of muscle, fascia and tendon of long head of biceps, left arm, initial encounter: Secondary | ICD-10-CM | POA: Diagnosis not present

## 2021-04-02 DIAGNOSIS — M75122 Complete rotator cuff tear or rupture of left shoulder, not specified as traumatic: Secondary | ICD-10-CM | POA: Diagnosis not present

## 2021-04-02 DIAGNOSIS — M19012 Primary osteoarthritis, left shoulder: Secondary | ICD-10-CM | POA: Diagnosis not present

## 2021-04-06 ENCOUNTER — Other Ambulatory Visit: Payer: Self-pay | Admitting: Family Medicine

## 2021-04-11 DIAGNOSIS — Z8673 Personal history of transient ischemic attack (TIA), and cerebral infarction without residual deficits: Secondary | ICD-10-CM | POA: Diagnosis not present

## 2021-04-11 DIAGNOSIS — E538 Deficiency of other specified B group vitamins: Secondary | ICD-10-CM | POA: Diagnosis not present

## 2021-04-11 DIAGNOSIS — M21371 Foot drop, right foot: Secondary | ICD-10-CM | POA: Diagnosis not present

## 2021-04-11 DIAGNOSIS — R569 Unspecified convulsions: Secondary | ICD-10-CM | POA: Diagnosis not present

## 2021-04-22 ENCOUNTER — Telehealth: Payer: Medicare Other

## 2021-04-22 ENCOUNTER — Telehealth: Payer: Self-pay

## 2021-04-22 NOTE — Telephone Encounter (Signed)
  Care Management   Follow Up Note   04/22/2021 Name: Justin Ford MRN: [id number] DOB: 12-25-1929   Referred by: [name], MD Reason for referral : Chronic Care Management (HTN, Falls)   Successful contact was made with patients daughter, [name].  Daughter not able to complete call at this time and request reschedule.   Follow Up Plan: Appointment rescheduled to 06/02/2021.   Quinn Plowman RN,BSN,CCM RN Case Manager [place]  [phone]

## 2021-05-02 ENCOUNTER — Ambulatory Visit (INDEPENDENT_AMBULATORY_CARE_PROVIDER_SITE_OTHER): Payer: Medicare Other

## 2021-05-02 ENCOUNTER — Telehealth: Payer: Medicare Other

## 2021-05-02 DIAGNOSIS — G40909 Epilepsy, unspecified, not intractable, without status epilepticus: Secondary | ICD-10-CM

## 2021-05-02 DIAGNOSIS — R296 Repeated falls: Secondary | ICD-10-CM

## 2021-05-02 DIAGNOSIS — I10 Essential (primary) hypertension: Secondary | ICD-10-CM | POA: Diagnosis not present

## 2021-05-02 NOTE — Chronic Care Management (AMB) (Signed)
Chronic Care Management   CCM RN Visit Note  05/02/2021 Name: Justin Ford MRN: [id number] DOB: [birth year]  Subjective: Justin Ford is a 87 y.o. year old male who is a primary care patient of [name], MD. The care management team was consulted for assistance with disease management and care coordination needs.    Engaged with patient by telephone for follow up visit in response to provider referral for case management and/or care coordination services.   Consent to Services:  The patient was given information about Chronic Care Management services, agreed to services, and gave verbal consent prior to initiation of services.  Please see initial visit note for detailed documentation.   Patient agreed to services and verbal consent obtained.   Assessment: Review of patient past medical history, allergies, medications, health status, including review of consultants reports, laboratory and other test data, was performed as part of comprehensive evaluation and provision of chronic care management services.   SDOH (Social Determinants of Health) assessments and interventions performed:    CCM Care Plan  Allergies  Allergen Reactions   Lisinopril Other (See Comments)    Elevated creatinine-caution on dosing.   Lipitor [Atorvastatin] Other (See Comments)    Reaction:  Unknown     Outpatient Encounter Medications as of 05/02/2021  Medication Sig   amLODipine (NORVASC) 5 MG tablet TAKE 1 TABLET BY MOUTH EVERY DAY   aspirin EC 81 MG tablet Take 81 mg by mouth daily. Swallow whole.   cyanocobalamin (,VITAMIN B-12,) 1000 MCG/ML injection 1043mcg IM monthly   Insulin Syringe-Needle U-100 (B-D INSULIN SYRINGE 1CC/25GX1") 25G X 1" 1 ML MISC Use monthly for B12 injection   levETIRAcetam (KEPPRA) 500 MG tablet Take 1 tablet (500 mg total) by mouth 2 (two) times daily.   pantoprazole (PROTONIX) 40 MG tablet Take 1 tablet (40 mg total) by mouth daily.   rosuvastatin (CRESTOR) 20 MG  tablet Take 1 tablet by mouth once daily   sertraline (ZOLOFT) 50 MG tablet Take 1 tablet (50 mg total) by mouth daily.   No facility-administered encounter medications on file as of 05/02/2021.    Patient Active Problem List   Diagnosis Date Noted   Vertigo 03/05/2021   Depression 12/04/2020   History of hip fracture 11/02/2020   Epilepsy undetermined as to focal or generalized ([place]) 11/02/2020   Insomnia 08/07/2020   Dysarthria 08/07/2020   Gait abnormality 05/15/2020   Angiodysplasia of intestinal tract    Polyp of colon    Melena    Angiodysplasia of stomach    Right foot drop 02/28/2017   Left leg swelling 10/30/2016   Gout 10/11/2016   B12 deficiency 05/21/2016   Bruising 05/21/2016   Tremor 03/08/2016   Hamstring strain 11/05/2015   Multiple falls 11/05/2015   CVA (cerebral infarction) 09/23/2015   Lower back pain 09/12/2015   Shoulder pain 09/12/2015   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   TIA (transient ischemic attack) 06/21/2015   Hyperglycemia 08/09/2013   Greater trochanteric bursitis 08/09/2013   Trigger finger 06/21/2012   Advance directive on file 03/22/2012   Medicare annual wellness visit, subsequent 03/15/2012   Dupuytren contracture 03/15/2012   Cough 12/11/2011   Barrett esophagus 10/12/2011   Chronic kidney disease, stage III (moderate) ([place]) 10/12/2011   HTN (hypertension) 09/27/2011   HLD (hyperlipidemia) 09/27/2011   GERD (gastroesophageal reflux disease) 09/27/2011    Conditions to be addressed/monitored:HTN and Falls, seizures  Care Plan : Fall Risk  Updates made by Quinn Plowman  E, RN since 05/02/2021 12:00 AM     Problem: Recent fall   Priority: High     Long-Range Goal: Absence of Fall and Fall-Related Injury   Start Date: 11/22/2020  Expected End Date: 08/04/2021  This Visit's Progress: On track  Recent Progress: On track  Priority: High  Note:   Current Barriers:  Knowledge Deficits related to fall precautions in  patient with recent surgical repair of fractured right hip and impaired mobility and history of seizures.  Daughter/ designated party release, Justin Ford contacted for patient follow up assessment. Daughter states patient had a seizure within the past months. She states patient had a follow up visit with the neurologist a few days after the seizure.  Daughter states patient taking Keppra 1000 mg 1 x per day.  Daughter states patient's dizziness has gotten better. Daughter unable to report blood pressure reading due to not having blood pressure log with her. Daughter states patient continues to take his medication and monitor blood pressure weekly.    Clinical Goal(s):  Patient and/or caregiver will demonstrate improved adherence to prescribed treatment plan for decreasing falls as evidenced by patient reporting and review of EMR Patient and/or caregiver will verbalize using fall risk reduction strategies discussed Patient will not experience additional falls  patient will not experience a fall related hospital re-admission.  patient will attend all scheduled medical appointments Interventions:  Collaboration with [name], MD regarding development and update of comprehensive plan of care as evidenced by provider attestation and co-signature Inter-disciplinary care team collaboration (see longitudinal plan of care) as needed.  Reviewed fall prevention strategies with daughter Medications reviewed.  Discussed with patient's daughter risk of falls related to seizures and the importance of taking medications as prescribed. RNCM discussed with patient importance of patient sustaining from Alcohol due to having seizures.  Discussed importance of ongoing follow up with provider. Daughter reports patient's next primary care provider visit is October 2022.  Patient Goals/Self-Care Activities  Continue to  use handrails on the stairs Always wear secure fitting  low-heeled or flat shoes or slippers  with nonskid soles when ambulating.  keep your cell phone with you always.  Keep your floors free of clutter.  Use a nonslip pad with throw rugs, or remove them completely Continue to use your walker for ambulation as for safety and as advised by your doctor.  Keep frequently used areas in your home well lit.  Continue to check your blood pressure at least 2-3 times per week.   Continue to take your medications regularly Don't stop seizure medications abruptly Continue to follow up with your doctor as recommended. Contact your doctor for any new or ongoing symptoms. Stay hydrated Stand up slowly, avoid overheating, and avoid crossing your legs when sitting.  Follow Up Plan: The patient has been provided with contact information for the care management team and has been advised to call with any health related questions or concerns.  The care management team will reach out to the patient again over the 2 months.           Care Plan : Hypertension  Updates made by Dannielle Karvonen, RN since 05/02/2021 12:00 AM     Problem: Disease Progression (Hypertension)   Priority: Medium     Long-Range Goal: Disease Progression Prevented or Minimized   Start Date: 11/22/2020  Expected End Date: 08/04/2021  This Visit's Progress: On track  Recent Progress: On track  Priority: Medium  Note:   Objective:  Last practice recorded BP readings:  BP Readings from Last 3 Encounters:  03/04/21 132/82  12/02/20 122/78  11/04/20 127/76  Current Barriers: Daughter/ designated party release, Justin Ford contacted for patient follow up assessment. Daughter states patient continues to take his medications as prescribed. She reports patient's dizziness has gotten much better.  Daughter unable to report any blood pressures to day because she does not have the log with her.  Daughter states patients blood pressure is being monitored weekly.   Knowledge Deficits related to long term self care management of  Hypertension   Case Manager Clinical Goal(s):  patient will verbalize understanding of plan for hypertension management patient will attend all scheduled medical appointments .  patient will demonstrate improved adherence to prescribed treatment plan for hypertension as evidenced by taking all medications as prescribed, monitoring and recording blood pressure as directed, adhering to low sodium/DASH diet Interventions:  Collaboration with [name], MD regarding development and update of comprehensive plan of care as evidenced by provider attestation and co-signature Inter-disciplinary care team collaboration (see longitudinal plan of care) Evaluation of current treatment plan related to hypertension self management and patient's adherence to plan as established by provider.  Reviewed medications with patient's daughter and discussed importance of compliance Discussed plans with patient's daughter for ongoing care management follow up and provided patient with direct contact information for care management team parameters.  Discussed upcoming patient provider appointments: Confirmed with daughter patients next follow up with visit with primary care provider is October 2022 Self-Care Activities: - Self administers medications as prescribed Attends all scheduled provider appointments Calls provider office for new concerns, questions, or BP outside discussed parameters Follows a low sodium diet/DASH diet Patient Goals: - Continue checking your blood pressure at least 1-2 times per week and record results.  - Continue to take your medications as prescribed.  - Continue attending follow up appointments with providers - Keep yourself hydrated by drinking enough water -  Continue to stand up slowly, avoid getting overheated, and avoid crossing your legs when sitting.  Follow Up Plan: The patient has been provided with contact information for the care management team and has been advised to  call with any health related questions or concerns.  The care management team will reach out to the patient again over the next 2 months.          Plan:The patient has been provided with contact information for the care management team and has been advised to call with any health related questions or concerns.  and The care management team will reach out to the patient again over the next 2 months. Quinn Plowman RN,BSN,CCM RN Case Manager [place]  [phone]

## 2021-05-02 NOTE — Patient Instructions (Signed)
Visit Information:  Thank you for taking the time to speak with me today.  PATIENT GOALS:  Goals Addressed             This Visit's Progress    Decrease occurence of seizures and seizure related injuries   Not on track    Timeframe:  Long-Range Goal Priority:  Medium Start Date:  11/22/2020                           Expected End Date:    08/04/2021  Follow up: 06/10/2021             Continue to keep a seizure diary/log Continue to take your medications regularly Don't stop seizure medications abruptly If possible avoid triggers for your seizures (e.g. flashing lights, stress).      Patient will not sustain a fall   On track    Timeframe:  Long-Range Goal Priority:  High Start Date: 11/22/2020                            Expected End Date: 08/04/2021                     Follow Up Date 06/10/2021  Continue to  use handrails on the stairs Always wear secure fitting  low-heeled or flat shoes or slippers with nonskid soles when ambulating.  keep your cell phone with you always.  Keep your floors free of clutter.  Use a nonslip pad with throw rugs, or remove them completely Continue to use your walker for ambulation as for safety and as advised by your doctor.  Keep frequently used areas in your home well lit.  Continue to check your blood pressure at least 2-3 times per week.   Continue to take your medications regularly Don't stop seizure medications abruptly Continue to follow up with your doctor as recommended. Contact your doctor for any new or ongoing symptoms. Stay hydrated Stand up slowly, avoid overheating, and avoid crossing your legs when sitting.    Why is this important?   Most falls happen when it is hard for you to walk safely. Your balance may be off because of an illness. You may have pain in your knees, hip or other joints.  You may be overly tired or taking medicines that make you sleepy. You may not be able to see or hear clearly.  Falls can lead to broken  bones, bruises or other injuries.  There are things you can do to help prevent falling.          Track and Manage My Blood Pressure-Hypertension   On track    Timeframe:  Long-Range Goal Priority:  Medium Start Date: 11/22/2020                            Expected End Date: 08/04/2021                    Follow Up Date 06/10/2021   - Continue checking your blood pressure at least 1-2 times per week and record results.  - Continue to take your medications as prescribed.  - Continue attending follow up appointments with providers - Keep yourself hydrated by drinking enough water -  Continue to stand up slowly, avoid getting overheated, and avoid crossing your legs when sitting.    Why is this  important?   You won't feel high blood pressure, but it can still hurt your blood vessels.  High blood pressure can cause heart or kidney problems. It can also cause a stroke.  Making lifestyle changes like losing a little weight or eating less salt will help.  Checking your blood pressure at home and at different times of the day can help to control blood pressure.  If the doctor prescribes medicine remember to take it the way the doctor ordered.  Call the office if you cannot afford the medicine or if there are questions about it.             Patient verbalizes understanding of instructions provided today and agrees to view in [place].   The patient has been provided with contact information for the care management team and has been advised to call with any health related questions or concerns.  The care management team will reach out to the patient again over the next 2 months.   Quinn Plowman RN,BSN,CCM RN Case Manager [place]  [phone]

## 2021-05-13 DIAGNOSIS — M19112 Post-traumatic osteoarthritis, left shoulder: Secondary | ICD-10-CM | POA: Diagnosis not present

## 2021-06-10 ENCOUNTER — Ambulatory Visit (INDEPENDENT_AMBULATORY_CARE_PROVIDER_SITE_OTHER): Payer: Medicare Other

## 2021-06-10 DIAGNOSIS — G40909 Epilepsy, unspecified, not intractable, without status epilepticus: Secondary | ICD-10-CM

## 2021-06-10 DIAGNOSIS — R296 Repeated falls: Secondary | ICD-10-CM

## 2021-06-10 DIAGNOSIS — I10 Essential (primary) hypertension: Secondary | ICD-10-CM

## 2021-06-10 NOTE — Chronic Care Management (AMB) (Signed)
Chronic Care Management   CCM RN Visit Note  06/10/2021 Name: Justin Ford MRN: [id number] DOB: [birth year]  Subjective: Justin Ford is a 87 y.o. year old male who is a primary care patient of [name], MD. The care management team was consulted for assistance with disease management and care coordination needs.    Engaged with patient by telephone for follow up visit in response to provider referral for case management and/or care coordination services.   Consent to Services:  The patient was given information about Chronic Care Management services, agreed to services, and gave verbal consent prior to initiation of services.  Please see initial visit note for detailed documentation.   Patient agreed to services and verbal consent obtained.   Assessment: Review of patient past medical history, allergies, medications, health status, including review of consultants reports, laboratory and other test data, was performed as part of comprehensive evaluation and provision of chronic care management services.   SDOH (Social Determinants of Health) assessments and interventions performed:    CCM Care Plan  Allergies  Allergen Reactions   Lisinopril Other (See Comments)    Elevated creatinine-caution on dosing.   Lipitor [Atorvastatin] Other (See Comments)    Reaction:  Unknown     Outpatient Encounter Medications as of 06/10/2021  Medication Sig   amLODipine (NORVASC) 5 MG tablet TAKE 1 TABLET BY MOUTH EVERY DAY   aspirin EC 81 MG tablet Take 81 mg by mouth daily. Swallow whole.   cyanocobalamin (,VITAMIN B-12,) 1000 MCG/ML injection 1062mcg IM monthly   Insulin Syringe-Needle U-100 (B-D INSULIN SYRINGE 1CC/25GX1") 25G X 1" 1 ML MISC Use monthly for B12 injection   levETIRAcetam (KEPPRA) 500 MG tablet Take 1 tablet (500 mg total) by mouth 2 (two) times daily.   pantoprazole (PROTONIX) 40 MG tablet Take 1 tablet (40 mg total) by mouth daily.   rosuvastatin (CRESTOR) 20 MG  tablet Take 1 tablet by mouth once daily   sertraline (ZOLOFT) 50 MG tablet Take 1 tablet (50 mg total) by mouth daily.   No facility-administered encounter medications on file as of 06/10/2021.    Patient Active Problem List   Diagnosis Date Noted   Vertigo 03/05/2021   Depression 12/04/2020   History of hip fracture 11/02/2020   Epilepsy undetermined as to focal or generalized ([place]) 11/02/2020   Insomnia 08/07/2020   Dysarthria 08/07/2020   Gait abnormality 05/15/2020   Angiodysplasia of intestinal tract    Polyp of colon    Melena    Angiodysplasia of stomach    Right foot drop 02/28/2017   Left leg swelling 10/30/2016   Gout 10/11/2016   B12 deficiency 05/21/2016   Bruising 05/21/2016   Tremor 03/08/2016   Hamstring strain 11/05/2015   Multiple falls 11/05/2015   CVA (cerebral infarction) 09/23/2015   Lower back pain 09/12/2015   Shoulder pain 09/12/2015   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   TIA (transient ischemic attack) 06/21/2015   Hyperglycemia 08/09/2013   Greater trochanteric bursitis 08/09/2013   Trigger finger 06/21/2012   Advance directive on file 03/22/2012   Medicare annual wellness visit, subsequent 03/15/2012   Dupuytren contracture 03/15/2012   Cough 12/11/2011   Barrett esophagus 10/12/2011   Chronic kidney disease, stage III (moderate) ([place]) 10/12/2011   HTN (hypertension) 09/27/2011   HLD (hyperlipidemia) 09/27/2011   GERD (gastroesophageal reflux disease) 09/27/2011    Conditions to be addressed/monitored:HTN and falls , seizures  Care Plan : Fall Risk  Updates made by Nyoka Cowden,  Delice Lesch, RN since 06/10/2021 12:00 AM     Problem: Recent fall   Priority: High     Long-Range Goal: Absence of Fall and Fall-Related Injury   Start Date: 11/22/2020  Expected End Date: 09/03/2021  This Visit's Progress: On track  Recent Progress: On track  Priority: Medium  Note:   Current Barriers:  Knowledge Deficits related to fall precautions in  patient with recent surgical repair of fractured right hip and impaired mobility and history of seizures.  Daughter/ designated party release, [name] contacted for patient follow up assessment. Daughter denies patient having any falls. She states he is doing well. She states he continues to take his medications as prescribed. She reports ongoing following of fall precautions.  Daughter denies patient having any break through seizures.     Clinical Goal(s):  Patient and/or caregiver will demonstrate improved adherence to prescribed treatment plan for decreasing falls as evidenced by patient reporting and review of EMR Patient and/or caregiver will verbalize using fall risk reduction strategies discussed Patient will not experience additional falls  patient will not experience a fall related hospital re-admission.  patient will attend all scheduled medical appointments Interventions:  Collaboration with [name], MD regarding development and update of comprehensive plan of care as evidenced by provider attestation and co-signature Inter-disciplinary care team collaboration (see longitudinal plan of care) as needed.  Reviewed fall prevention strategies with daughter Medications reviewed.  Discussed with patient's daughter risk of falls related to seizures and the importance of taking medications as prescribed. RNCM discussed with patient importance of patient sustaining from Alcohol due to having seizures.  Discussed importance of ongoing follow up with provider. Daughter reports patient's next primary care provider visit is October 2022. She states she will have to call the doctors office to scheduled. She reports patient has a follow up visit with the neurologist in 1 week.  Patient Goals/Self-Care Activities  Continue to  use handrails on the stairs Always wear secure fitting  low-heeled or flat shoes or slippers with nonskid soles when ambulating.  keep your cell phone with you  always.  Keep your floors free of clutter.  Use a nonslip pad with throw rugs, or remove them completely Continue to use your walker for ambulation as for safety and as advised by your doctor.  Keep frequently used areas in your home well lit.  Continue to check your blood pressure at least 2-3 times per week. ( Please record blood pressures and report them to the RN case manager at next telephone visit) Continue to take your medications regularly and refill them timely Don't stop seizure medications abruptly, continue to take as prescribed.  Continue to follow up with your doctor as recommended. Contact your doctor for any new or ongoing symptoms. Stay hydrated Stand up slowly, avoid overheating, and avoid crossing your legs when sitting.  Follow Up Plan: The patient has been provided with contact information for the care management team and has been advised to call with any health related questions or concerns.  The care management team will reach out to the patient again over the 2 months.           Care Plan : Hypertension  Updates made by Dannielle Karvonen, RN since 06/10/2021 12:00 AM     Problem: Disease Progression (Hypertension)   Priority: Medium     Long-Range Goal: Disease Progression Prevented or Minimized   Start Date: 11/22/2020  Expected End Date: 09/03/2021  This Visit's Progress: On  track  Recent Progress: On track  Priority: Medium  Note:   Objective:  Last practice recorded BP readings:  BP Readings from Last 3 Encounters:  03/04/21 132/82  12/02/20 122/78  11/04/20 127/76  Current Barriers: Daughter/ designated party release, [name] contacted for patient follow up assessment. Daughter states patient continues to take his medications as prescribed. Daughter states patients issues with dizziness has resolved. Daughter states she checks patients blood pressures a few times a month but does not record.  RNCM requested daughter record blood pressure and  report at next telephone outreach. Daughter verbalized understanding and agreement.   Knowledge Deficits related to long term self care management of Hypertension   Case Manager Clinical Goal(s):  patient will verbalize understanding of plan for hypertension management patient will attend all scheduled medical appointments .  patient will demonstrate improved adherence to prescribed treatment plan for hypertension as evidenced by taking all medications as prescribed, monitoring and recording blood pressure as directed, adhering to low sodium/DASH diet Interventions:  Collaboration with [name], MD regarding development and update of comprehensive plan of care as evidenced by provider attestation and co-signature Inter-disciplinary care team collaboration (see longitudinal plan of care) Evaluation of current treatment plan related to hypertension self management and patient's adherence to plan as established by provider.  Reviewed medications with patient's daughter and discussed importance of compliance Discussed plans with patient's daughter for ongoing care management follow up and provided patient with direct contact information for care management team parameters.  Discussed upcoming patient provider appointments: Confirmed with daughter patients next follow up with visit with primary care provider is October 2022. She states she will need to call and scheduled.  Daughter states patients next neurology appointment is in 1 week.  Self-Care Activities: - Self administers medications as prescribed Attends all scheduled provider appointments Calls provider office for new concerns, questions, or BP outside discussed parameters Follows a low sodium diet/DASH diet Patient Goals: - Continue checking your blood pressure at least 1-2 times per week and record results. Please report blood pressure readings to RN case manager at next telephone outreach.  - Continue to take your medications as  prescribed and refill them timely.   - Continue attending follow up appointments with providers. Notify provider of any new or ongoing symptoms.  - Keep yourself hydrated by drinking enough water -  Continue to stand up slowly, avoid getting overheated, and avoid crossing your legs when sitting.  Follow Up Plan: The patient has been provided with contact information for the care management team and has been advised to call with any health related questions or concerns.  The care management team will reach out to the patient again over the next 2 months.          Plan:The patient has been provided with contact information for the care management team and has been advised to call with any health related questions or concerns.  and The care management team will reach out to the patient again over the next 45 days. Quinn Plowman RN,BSN,CCM RN Case Manager [place]  [phone]

## 2021-06-10 NOTE — Patient Instructions (Signed)
Visit Information:  Thank you for taking the time to speak with me today.  PATIENT GOALS:  Goals Addressed             This Visit's Progress    Decrease occurence of seizures and seizure related injuries   On track    Timeframe:  Long-Range Goal Priority:  Medium Start Date:  11/22/2020                           Expected End Date:    09/03/2021  Follow up: 07/29/2021           Continue to keep a seizure diary/log Continue to take your medications regularly Don't stop seizure medications abruptly, continue to take as prescribed.  If possible avoid triggers for your seizures (e.g. flashing lights, stress).      Patient will not sustain a fall   On track    Timeframe:  Long-Range Goal Priority:  High Start Date: 11/22/2020                            Expected End Date: 09/03/2021                   Follow Up Date 07/29/2021  Continue to  use handrails on the stairs Always wear secure fitting  low-heeled or flat shoes or slippers with nonskid soles when ambulating.  keep your cell phone with you always.  Keep your floors free of clutter.  Use a nonslip pad with throw rugs, or remove them completely Continue to use your walker for ambulation as for safety and as advised by your doctor.  Keep frequently used areas in your home well lit.  Continue to check your blood pressure at least 2-3 times per week. ( Please record blood pressures and report them to the RN case manager at next telephone visit) Continue to take your medications regularly and refill them timely Don't stop seizure medications abruptly, continue to take as prescribed.  Continue to follow up with your doctor as recommended. Contact your doctor for any new or ongoing symptoms. Stay hydrated Stand up slowly, avoid overheating, and avoid crossing your legs when sitting.    Why is this important?   Most falls happen when it is hard for you to walk safely. Your balance may be off because of an illness. You may have  pain in your knees, hip or other joints.  You may be overly tired or taking medicines that make you sleepy. You may not be able to see or hear clearly.  Falls can lead to broken bones, bruises or other injuries.  There are things you can do to help prevent falling.          Track and Manage My Blood Pressure-Hypertension   On track    Timeframe:  Long-Range Goal Priority:  Medium Start Date: 11/22/2020                            Expected End Date: 09/03/2021                   Follow Up Date 07/29/2021   - Continue checking your blood pressure at least 1-2 times per week and record results. Please report blood pressure readings to RN case manager at next telephone outreach.  - Continue to take your medications as prescribed and refill  them timely.   - Continue attending follow up appointments with providers. Notify provider of any new or ongoing symptoms.  - Keep yourself hydrated by drinking enough water -  Continue to stand up slowly, avoid getting overheated, and avoid crossing your legs when sitting.    Why is this important?   You won't feel high blood pressure, but it can still hurt your blood vessels.  High blood pressure can cause heart or kidney problems. It can also cause a stroke.  Making lifestyle changes like losing a little weight or eating less salt will help.  Checking your blood pressure at home and at different times of the day can help to control blood pressure.  If the doctor prescribes medicine remember to take it the way the doctor ordered.  Call the office if you cannot afford the medicine or if there are questions about it.             Patient verbalizes understanding of instructions provided today and agrees to view in [place].   The patient has been provided with contact information for the care management team and has been advised to call with any health related questions or concerns.  The care management team will reach out to the patient again over the  next 45 days.   Quinn Plowman RN,BSN,CCM RN Case Manager [place]  [phone]

## 2021-06-17 ENCOUNTER — Other Ambulatory Visit: Payer: Self-pay | Admitting: Family Medicine

## 2021-06-18 DIAGNOSIS — R569 Unspecified convulsions: Secondary | ICD-10-CM | POA: Diagnosis not present

## 2021-06-28 DIAGNOSIS — Z23 Encounter for immunization: Secondary | ICD-10-CM | POA: Diagnosis not present

## 2021-07-03 ENCOUNTER — Other Ambulatory Visit: Payer: Self-pay | Admitting: Family Medicine

## 2021-07-04 DIAGNOSIS — I10 Essential (primary) hypertension: Secondary | ICD-10-CM | POA: Diagnosis not present

## 2021-07-29 ENCOUNTER — Telehealth: Payer: Self-pay

## 2021-07-29 ENCOUNTER — Telehealth: Payer: Self-pay | Admitting: *Deleted

## 2021-07-29 ENCOUNTER — Telehealth: Payer: Medicare Other

## 2021-07-29 NOTE — Chronic Care Management (AMB) (Signed)
  Care Management   Note  07/29/2021 Name: HANS RUSHER MRN: [id number] DOB: 1930-02-19  KIVEN VANGILDER is a 87 y.o. year old male who is a primary care patient of [name], MD and is actively engaged with the care management team. I reached out to Mercy Moore by phone today to assist with re-scheduling a follow up visit with the RN Case Manager  Follow up plan: Unsuccessful telephone outreach attempt made. A HIPAA compliant phone message was left for the patient providing contact information and requesting a return call.   Julian Hy, [place] Management  Direct Dial: [phone]

## 2021-07-29 NOTE — Telephone Encounter (Signed)
  Care Management   Follow Up Note   07/29/2021 Name: Justin Ford MRN: [id number] DOB: 02-13-1930   Referred by: [name], MD Reason for referral : Chronic Care Management (HTN, [place])   An unsuccessful telephone outreach was attempted today. The patient was referred to the case management team for assistance with care management and care coordination.   Follow Up Plan: A HIPPA compliant phone message was left for the patient providing contact information and requesting a return call.   Quinn Plowman RN,BSN,CCM RN Case Manager [place]  [phone]

## 2021-08-04 ENCOUNTER — Telehealth: Payer: Self-pay | Admitting: Family Medicine

## 2021-08-04 NOTE — Telephone Encounter (Signed)
Pt daughter called stating a Stacy called from [place].

## 2021-08-06 NOTE — Chronic Care Management (AMB) (Signed)
  Care Management   Note  08/06/2021 Name: Justin Ford MRN: [id number] DOB: Jan 17, 1930  Justin Ford is a 87 y.o. year old male who is a primary care patient of [name], MD and is actively engaged with the care management team. I reached out to Mercy Moore by phone today to assist with re-scheduling a follow up visit with the RN Case Manager  Follow up plan: 2nd Unsuccessful telephone outreach attempt made. A HIPAA compliant phone message was left for the patient providing contact information and requesting a return call.   Julian Hy, [place] Management  Direct Dial: [phone]

## 2021-08-12 DIAGNOSIS — M19012 Primary osteoarthritis, left shoulder: Secondary | ICD-10-CM | POA: Diagnosis not present

## 2021-08-13 ENCOUNTER — Telehealth: Payer: Self-pay | Admitting: Family Medicine

## 2021-08-13 NOTE — Telephone Encounter (Signed)
Called patient but vm is not set up. Sent mychart letter

## 2021-08-13 NOTE — Telephone Encounter (Signed)
-----   Message from [name], MD sent at 08/06/2021 10:19 PM EDT ----- Please get patient scheduled for yearly visit in jan or feb 2023.  Thanks.

## 2021-08-20 NOTE — Chronic Care Management (AMB) (Signed)
  Care Management   Note  08/20/2021 Name: VANSH RECKART MRN: [id number] DOB: 1930/04/30  EOGHAN BELCHER is a 87 y.o. year old male who is a primary care patient of [name], MD and is actively engaged with the care management team. I reached out to Mercy Moore by phone today to assist with re-scheduling a follow up visit with the RN Case Manager  Follow up plan: Telephone appointment with care management team member scheduled for: 09/15/2021  Julian Hy, [place], [place] Management  Direct Dial: [phone]

## 2021-08-25 ENCOUNTER — Ambulatory Visit: Payer: Medicare Other | Admitting: Family Medicine

## 2021-09-15 ENCOUNTER — Ambulatory Visit: Payer: Medicare Other

## 2021-09-15 DIAGNOSIS — R296 Repeated falls: Secondary | ICD-10-CM

## 2021-09-15 DIAGNOSIS — I10 Essential (primary) hypertension: Secondary | ICD-10-CM

## 2021-09-15 DIAGNOSIS — G40909 Epilepsy, unspecified, not intractable, without status epilepticus: Secondary | ICD-10-CM

## 2021-09-15 NOTE — Chronic Care Management (AMB) (Signed)
Chronic Care Management   CCM RN Visit Note  09/15/2021 Name: Justin Ford MRN: [id number] DOB: 06-05-1930  Subjective: Justin Ford is a 87 y.o. year old male who is a primary care patient of [name], MD. The care management team was consulted for assistance with disease management and care coordination needs.    Engaged with patient by telephone for follow up visit in response to provider referral for case management and/or care coordination services.   Consent to Services:  The patient was given information about Chronic Care Management services, agreed to services, and gave verbal consent prior to initiation of services.  Please see initial visit note for detailed documentation.   Patient agreed to services and verbal consent obtained.   Assessment: Review of patient past medical history, allergies, medications, health status, including review of consultants reports, laboratory and other test data, was performed as part of comprehensive evaluation and provision of chronic care management services.   SDOH (Social Determinants of Health) assessments and interventions performed:    CCM Care Plan  Allergies  Allergen Reactions   Lisinopril Other (See Comments)    Elevated creatinine-caution on dosing.   Lipitor [Atorvastatin] Other (See Comments)    Reaction:  Unknown     Outpatient Encounter Medications as of 09/15/2021  Medication Sig   amLODipine (NORVASC) 5 MG tablet TAKE 1 TABLET BY MOUTH EVERY DAY   aspirin EC 81 MG tablet Take 81 mg by mouth daily. Swallow whole.   cyanocobalamin (,VITAMIN B-12,) 1000 MCG/ML injection 1031mg IM monthly   Insulin Syringe-Needle U-100 (B-D INSULIN SYRINGE 1CC/25GX1") 25G X 1" 1 ML MISC Use monthly for B12 injection   levETIRAcetam (KEPPRA) 500 MG tablet Take 1 tablet (500 mg total) by mouth 2 (two) times daily.   pantoprazole (PROTONIX) 40 MG tablet TAKE 1 TABLET BY MOUTH EVERY DAY   rosuvastatin (CRESTOR) 20 MG tablet TAKE  1 TABLET BY MOUTH EVERY DAY   sertraline (ZOLOFT) 50 MG tablet Take 1 tablet (50 mg total) by mouth daily.   No facility-administered encounter medications on file as of 09/15/2021.    Patient Active Problem List   Diagnosis Date Noted   Vertigo 03/05/2021   Depression 12/04/2020   History of hip fracture 11/02/2020   Epilepsy undetermined as to focal or generalized ([place] 11/02/2020   Insomnia 08/07/2020   Dysarthria 08/07/2020   Gait abnormality 05/15/2020   Angiodysplasia of intestinal tract    Polyp of colon    Melena    Angiodysplasia of stomach    Right foot drop 02/28/2017   Left leg swelling 10/30/2016   Gout 10/11/2016   B12 deficiency 05/21/2016   Bruising 05/21/2016   Tremor 03/08/2016   Hamstring strain 11/05/2015   Multiple falls 11/05/2015   CVA (cerebral infarction) 09/23/2015   Lower back pain 09/12/2015   Shoulder pain 09/12/2015   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   TIA (transient ischemic attack) 06/21/2015   Hyperglycemia 08/09/2013   Greater trochanteric bursitis 08/09/2013   Trigger finger 06/21/2012   Advance directive on file 03/22/2012   Medicare annual wellness visit, subsequent 03/15/2012   Dupuytren contracture 03/15/2012   Cough 12/11/2011   Barrett esophagus 10/12/2011   Chronic kidney disease, stage III (moderate) ([place] 10/12/2011   HTN (hypertension) 09/27/2011   HLD (hyperlipidemia) 09/27/2011   GERD (gastroesophageal reflux disease) 09/27/2011    Conditions to be addressed/monitored:HTN and Seizures, Falls  Care Plan : General Plan of Care (Adult)  Updates made by GNyoka Cowden  Delice Lesch, RN since 09/15/2021 12:00 AM     Problem: Chronic care management education and/ or care coodination needs.   Priority: High     Long-Range Goal: Development of plan of care to address chronic case management needs and / or care coordination   Start Date: 09/15/2021  Expected End Date: 12/02/2021  This Visit's Progress: On track   Priority: High  Note:   Current Barriers:  Chronic Disease Management support and education needs related to HTN and Seizures Telephone call with patients daughter/ designated party release, [name].  HIPAA verified. Daughter states patient is doing fine. She denies patient having any falls or seizure since last outreach with [facility] in October 2022.  Daughter states no change in treatment plan. She reports he continues to take the same medications as prescribed. Per char review next follow up visit with primary care provider is 10/07/2021.  No further concerns at this time.  RNCM Clinical Goal(s):  Patient will verbalize understanding of plan for management of HTN and Falls, Seizures as evidenced by caregiver report and/ or notation in chart.  take all medications exactly as prescribed and will call provider for medication related questions as evidenced by  caregiver report and/ or notation in chart    attend all scheduled medical appointments:   as evidenced by  caregiver report and/ or notation in chart.         continue to work with Consulting civil engineer and/or Social Worker to address care management and care coordination needs related to HTN and seizure/ falls as evidenced by adherence to CM Team Scheduled appointments     through collaboration with Consulting civil engineer, provider, and care team.   Interventions: 1:1 collaboration with primary care provider regarding development and update of comprehensive plan of care as evidenced by provider attestation and co-signature Inter-disciplinary care team collaboration (see longitudinal plan of care) Evaluation of current treatment plan related to  self management and patient's adherence to plan as established by provider   Falls Interventions:  (Status:  Goal on track:  Yes.) Long Term Goal Reviewed medications and discussed potential side effects of medications such as dizziness and frequent urination Advised patient of importance of notifying provider  of falls Assessed for falls since last encounter Assessed patients knowledge of fall risk prevention secondary to previously provided education Discussed fall prevention   Seizures  (Status:  Goal on track:  Yes.)  Long Term Goal Evaluation of current treatment plan related to HTN and Falls, seizures ,  self-management and patient's adherence to plan as established by provider. Discussed plans with patient for ongoing care management follow up and provided patient with direct contact information for care management team Evaluation of current treatment plan related to seizures and patient's adherence to plan as established by provider Reviewed medications with patient and discussed   Reviewed scheduled/upcoming provider appointments including Discussed plans with patient for ongoing care management follow up and provided patient with direct contact information for care management team  Hypertension Interventions:  (Status:  Goal on track:  Yes.) Long Term Goal Last practice recorded BP readings:  BP Readings from Last 3 Encounters:  03/04/21 132/82  12/02/20 122/78  11/04/20 127/76  Most recent eGFR/CrCl: No results found for: EGFR  No components found for: CRCL  Evaluation of current treatment plan related to hypertension self management and patient's adherence to plan as established by provider Reviewed medications with patient and discussed importance of compliance Discussed plans with patient for ongoing care  management follow up and provided patient with direct contact information for care management team Reviewed scheduled/upcoming provider appointments including:   Advised to call provider office for new concerns/ symptoms. Advised to continue to follow low salt diet.   Patient Goals/Self-Care Activities: Take medications as prescribed   Attend all scheduled provider appointments Call pharmacy for medication refills 3-7 days in advance of running out of medications Call  provider office for new concerns or questions  Continue to follow a low salt diet. Don't stop seizure medications abruptly, continue to take as prescribed.  Stay hydrated Stand up slowly, avoid overheating, and avoid crossing your legs when sitting.        Plan:The patient has been provided with contact information for the care management team and has been advised to call with any health related questions or concerns.  The care management team will reach out to the patient again over the next 3 months . Quinn Plowman RN,BSN,CCM RN Case Manager [place]  [phone]

## 2021-09-15 NOTE — Patient Instructions (Signed)
Visit Information  Thank you for taking time to visit with me today. Please don't hesitate to contact me if I can be of assistance to you before our next scheduled telephone appointment.  Patient Goals/Self-Care Activities: Take medications as prescribed   Attend all scheduled provider appointments Call pharmacy for medication refills 3-7 days in advance of running out of medications Call provider office for new concerns or questions  Continue to follow a low salt diet. Don't stop seizure medications abruptly, continue to take as prescribed.  Stay hydrated Stand up slowly, avoid overheating, and avoid crossing your legs when sitting.     Our next appointment is by telephone on December 02, 2021 at 1:00 pm  Please call the care guide team at [phone] if you need to cancel or reschedule your appointment.   If you are experiencing a Mental Health or [place] or need someone to talk to, please call the Suicide and Crisis Lifeline: 988 call the [place] National Suicide Prevention Lifeline: 612-145-0659 or TTY: (334) 111-7557 TTY 217-848-1980) to talk to a trained counselor call 1-800-273-TALK (toll free, 24 hour hotline)   Patient verbalizes understanding of instructions provided today and agrees to view in [place].   Quinn Plowman RN,BSN,CCM RN Case Manager [place]  [phone]

## 2021-09-29 ENCOUNTER — Other Ambulatory Visit: Payer: Self-pay | Admitting: Family Medicine

## 2021-10-07 ENCOUNTER — Other Ambulatory Visit: Payer: Self-pay

## 2021-10-07 ENCOUNTER — Encounter: Payer: Self-pay | Admitting: Family Medicine

## 2021-10-07 ENCOUNTER — Ambulatory Visit (INDEPENDENT_AMBULATORY_CARE_PROVIDER_SITE_OTHER): Payer: Medicare Other | Admitting: Family Medicine

## 2021-10-07 VITALS — BP 120/64 | HR 70 | Temp 97.2°F | Ht 68.0 in | Wt 163.0 lb

## 2021-10-07 DIAGNOSIS — E538 Deficiency of other specified B group vitamins: Secondary | ICD-10-CM | POA: Diagnosis not present

## 2021-10-07 DIAGNOSIS — G40909 Epilepsy, unspecified, not intractable, without status epilepticus: Secondary | ICD-10-CM

## 2021-10-07 DIAGNOSIS — E785 Hyperlipidemia, unspecified: Secondary | ICD-10-CM | POA: Diagnosis not present

## 2021-10-07 MED ORDER — CYANOCOBALAMIN 1000 MCG/ML IJ SOLN: INTRAMUSCULAR | 3 refills | Status: DC

## 2021-10-07 MED ORDER — CYANOCOBALAMIN 1000 MCG/ML IJ SOLN
1000.0000 ug | Freq: Once | INTRAMUSCULAR | Status: AC
  Administered 2021-10-07: 1000 ug via INTRAMUSCULAR

## 2021-10-07 NOTE — Patient Instructions (Signed)
B12 shot today.    Please check your schedule and see about getting an appointment for a repeat B12 level in about 2 weeks at [facility].  Please call [phone] if you don't schedule on the way out today.    We'll see about labs later this spring.   Take care.  Glad to see you.   [employer] [address]. Justin Ford, Justin Ford

## 2021-10-07 NOTE — Progress Notes (Signed)
This visit occurred during the SARS-CoV-2 public health emergency.  Safety protocols were in place, including screening questions prior to the visit, additional usage of staff PPE, and extensive cleaning of exam room while observing appropriate contact time as indicated for disinfecting solutions.  B12 shot due today.  D/w pt about getting f/u B12 shot at [place] and then labs later on.  Would need B12 shots every 2 weeks temporarily, since he has been off replacement recently.    History of seizure disorder.  He isn't driving. No SZs since July.  Still on keppra.  Compliant.    Elevated Cholesterol: Using medications without problems:yes Muscle aches: some aches at night but not in the day Diet compliance: variable, but family is helping with cooking.   Exercise: limited, still using rollator.    Meds, vitals, and allergies reviewed.   ROS: Per HPI unless specifically indicated in ROS section   GEN: nad, alert and oriented HEENT: ncat NECK: supple w/o LA CV: rrr.  PULM: ctab, no inc wob ABD: soft, +bs EXT: no edema SKIN: well perfused.

## 2021-10-08 NOTE — Assessment & Plan Note (Signed)
Restart replacement.  Defer labs now as we expect his B12 level be low.  Would be reasonable to get every other week injections for the near future and then we can recheck his labs later this spring.  Discussed.  He agrees.  See after visit summary.

## 2021-10-08 NOTE — Assessment & Plan Note (Signed)
Continue Keppra.  No recent events.  Compliant.

## 2021-10-08 NOTE — Assessment & Plan Note (Signed)
Continue Crestor.  We can recheck labs later in the spring.

## 2021-10-21 ENCOUNTER — Ambulatory Visit: Payer: Medicare Other

## 2021-10-22 ENCOUNTER — Other Ambulatory Visit: Payer: Self-pay

## 2021-10-22 ENCOUNTER — Ambulatory Visit (INDEPENDENT_AMBULATORY_CARE_PROVIDER_SITE_OTHER): Payer: Medicare Other

## 2021-10-22 DIAGNOSIS — E538 Deficiency of other specified B group vitamins: Secondary | ICD-10-CM

## 2021-10-22 MED ORDER — CYANOCOBALAMIN 1000 MCG/ML IJ SOLN
1000.0000 ug | Freq: Once | INTRAMUSCULAR | Status: AC
  Administered 2021-10-22: 1000 ug via INTRAMUSCULAR

## 2021-10-22 NOTE — Progress Notes (Signed)
Per orders of Dr. Danise Mina in leu of Dr. Josefine Class absence, injection of B12 given by Ophelia Shoulder. Patient tolerated injection well.

## 2021-11-16 ENCOUNTER — Telehealth: Payer: Self-pay | Admitting: Family Medicine

## 2021-11-16 DIAGNOSIS — E538 Deficiency of other specified B group vitamins: Secondary | ICD-10-CM

## 2021-11-16 DIAGNOSIS — I10 Essential (primary) hypertension: Secondary | ICD-10-CM

## 2021-11-16 DIAGNOSIS — M109 Gout, unspecified: Secondary | ICD-10-CM

## 2021-11-16 DIAGNOSIS — E785 Hyperlipidemia, unspecified: Secondary | ICD-10-CM

## 2021-11-16 NOTE — Telephone Encounter (Signed)
Please get patient scheduled for labs in about 1 month.  I put in the orders.  Thanks.  Fasting if possible.

## 2021-11-22 ENCOUNTER — Other Ambulatory Visit: Payer: Self-pay | Admitting: Family Medicine

## 2021-11-28 NOTE — Telephone Encounter (Signed)
Called pt and got him scheduled on 3/21 @1145 

## 2021-12-02 ENCOUNTER — Ambulatory Visit (INDEPENDENT_AMBULATORY_CARE_PROVIDER_SITE_OTHER): Payer: Medicare Other

## 2021-12-02 DIAGNOSIS — R296 Repeated falls: Secondary | ICD-10-CM

## 2021-12-02 DIAGNOSIS — G40909 Epilepsy, unspecified, not intractable, without status epilepticus: Secondary | ICD-10-CM

## 2021-12-02 DIAGNOSIS — I10 Essential (primary) hypertension: Secondary | ICD-10-CM

## 2021-12-02 NOTE — Patient Instructions (Signed)
Visit Information  Thank you for taking time to visit with me today. Please don't hesitate to contact me if I can be of assistance to you before our next scheduled telephone appointment.  Following are the goals we discussed today:  Unable to complete assessment.  Care guides to call daughter to reschedule telephone visit.    Please call the care guide team at [phone] if you need to cancel or reschedule your appointment.   If you are experiencing a Mental Health or [place] or need someone to talk to, please call the Suicide and Crisis Lifeline: 988 call 1-800-273-TALK (toll free, 24 hour hotline)   Patient verbalizes understanding of instructions and care plan provided today and agrees to view in [place]. Active MyChart status confirmed with patient.    Quinn Plowman RN,BSN,CCM RN Case Manager [place]  [phone]

## 2021-12-02 NOTE — Chronic Care Management (AMB) (Signed)
Chronic Care Management   CCM RN Visit Note  12/02/2021 Name: Justin Ford MRN: [id number] DOB: [birth year]  Subjective: Justin Ford is a 88 y.o. year old male who is a primary care patient of [name], MD. The care management team was consulted for assistance with disease management and care coordination needs.    Engaged with patient by telephone for follow up visit in response to provider referral for case management and/or care coordination services.   Consent to Services:  The patient was given information about Chronic Care Management services, agreed to services, and gave verbal consent prior to initiation of services.  Please see initial visit note for detailed documentation.   Patient agreed to services and verbal consent obtained.   Assessment: Review of patient past medical history, allergies, medications, health status, including review of consultants reports, laboratory and other test data, was performed as part of comprehensive evaluation and provision of chronic care management services.   SDOH (Social Determinants of Health) assessments and interventions performed:    CCM Care Plan  Allergies  Allergen Reactions   Lisinopril Other (See Comments)    Elevated creatinine-caution on dosing.   Lipitor [Atorvastatin] Other (See Comments)    Reaction:  Unknown     Outpatient Encounter Medications as of 12/02/2021  Medication Sig   amLODipine (NORVASC) 5 MG tablet TAKE 1 TABLET BY MOUTH EVERY DAY   aspirin EC 81 MG tablet Take 81 mg by mouth daily. Swallow whole.   cyanocobalamin (,VITAMIN B-12,) 1000 MCG/ML injection 1082mg IM every 2 weeks as of 10/07/2021   Insulin Syringe-Needle U-100 (B-D INSULIN SYRINGE 1CC/25GX1") 25G X 1" 1 ML MISC Use monthly for B12 injection   levETIRAcetam (KEPPRA) 500 MG tablet Take 500 mg by mouth 2 (two) times daily.   pantoprazole (PROTONIX) 40 MG tablet TAKE 1 TABLET BY MOUTH EVERY DAY   rosuvastatin (CRESTOR) 20 MG tablet  TAKE 1 TABLET BY MOUTH EVERY DAY   sertraline (ZOLOFT) 50 MG tablet TAKE 1 TABLET BY MOUTH EVERY DAY   No facility-administered encounter medications on file as of 12/02/2021.    Patient Active Problem List   Diagnosis Date Noted   Vertigo 03/05/2021   Depression 12/04/2020   History of hip fracture 11/02/2020   Epilepsy undetermined as to focal or generalized ([place] 11/02/2020   Insomnia 08/07/2020   Dysarthria 08/07/2020   Gait abnormality 05/15/2020   Angiodysplasia of intestinal tract    Polyp of colon    Melena    Angiodysplasia of stomach    Right foot drop 02/28/2017   Left leg swelling 10/30/2016   Gout 10/11/2016   B12 deficiency 05/21/2016   Bruising 05/21/2016   Tremor 03/08/2016   Hamstring strain 11/05/2015   Multiple falls 11/05/2015   CVA (cerebral infarction) 09/23/2015   Lower back pain 09/12/2015   Shoulder pain 09/12/2015   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   TIA (transient ischemic attack) 06/21/2015   Hyperglycemia 08/09/2013   Greater trochanteric bursitis 08/09/2013   Trigger finger 06/21/2012   Advance directive on file 03/22/2012   Medicare annual wellness visit, subsequent 03/15/2012   Dupuytren contracture 03/15/2012   Cough 12/11/2011   Barrett esophagus 10/12/2011   Chronic kidney disease, stage III (moderate) ([place] 10/12/2011   HTN (hypertension) 09/27/2011   HLD (hyperlipidemia) 09/27/2011   GERD (gastroesophageal reflux disease) 09/27/2011    Conditions to be addressed/monitored:HTN and Falls, Seizure  Care Plan : General Plan of Care (Adult)  Updates made by GNyoka Cowden  Delice Lesch, RN since 12/02/2021 12:00 AM     Problem: Chronic care management education and/ or care coodination needs.   Priority: High     Long-Range Goal: Development of plan of care to address chronic case management needs and / or care coordination   Start Date: 09/15/2021  Expected End Date: 01/30/2022  Recent Progress: On track  Priority: High   Note:   Current Barriers:  Chronic Disease Management support and education needs related to HTN and Seizures Telephone call with patient.  Patient states he is doing well.  He reports his daughter handles all of his medical information/ appointments and medications.  Call attempted to daughter, [name].  Unable to reach. HIPAA compliant voice message left for daughter requesting return call. Unable to complete assessment.  Will request care guide to reschedule appointment with patients daughter.   RNCM Clinical Goal(s):  Patient will verbalize understanding of plan for management of HTN and Falls, Seizures as evidenced by caregiver report and/ or notation in chart.  take all medications exactly as prescribed and will call provider for medication related questions as evidenced by  caregiver report and/ or notation in chart    attend all scheduled medical appointments:   as evidenced by  caregiver report and/ or notation in chart.         continue to work with Consulting civil engineer and/or Social Worker to address care management and care coordination needs related to HTN and seizure/ falls as evidenced by adherence to CM Team Scheduled appointments     through collaboration with Consulting civil engineer, provider, and care team.   Interventions: 1:1 collaboration with primary care provider regarding development and update of comprehensive plan of care as evidenced by provider attestation and co-signature Inter-disciplinary care team collaboration (see longitudinal plan of care) Evaluation of current treatment plan related to  self management and patient's adherence to plan as established by provider   Falls Interventions:  (Status:  Goal on track:  Yes.) Long Term Goal Reviewed medications and discussed potential side effects of medications such as dizziness and frequent urination Advised patient of importance of notifying provider of falls Assessed for falls since last encounter Assessed patients  knowledge of fall risk prevention secondary to previously provided education Discussed fall prevention   Seizures  (Status:  Goal on track:  Yes.)  Long Term Goal Evaluation of current treatment plan related to HTN and Falls, seizures ,  self-management and patient's adherence to plan as established by provider. Discussed plans with patient for ongoing care management follow up and provided patient with direct contact information for care management team Evaluation of current treatment plan related to seizures and patient's adherence to plan as established by provider Reviewed medications with patient and discussed   Reviewed scheduled/upcoming provider appointments including Discussed plans with patient for ongoing care management follow up and provided patient with direct contact information for care management team  Hypertension Interventions:  (Status:  Goal on track:  Yes.) Long Term Goal Last practice recorded BP readings:  BP Readings from Last 3 Encounters:  03/04/21 132/82  12/02/20 122/78  11/04/20 127/76  Most recent eGFR/CrCl: No results found for: EGFR  No components found for: CRCL  Evaluation of current treatment plan related to hypertension self management and patient's adherence to plan as established by provider Reviewed medications with patient and discussed importance of compliance Discussed plans with patient for ongoing care management follow up and provided patient with direct contact information for care  management team Reviewed scheduled/upcoming provider appointments including:   Advised to call provider office for new concerns/ symptoms. Advised to continue to follow low salt diet.   Patient Goals/Self-Care Activities: Take medications as prescribed   Attend all scheduled provider appointments Call pharmacy for medication refills 3-7 days in advance of running out of medications Call provider office for new concerns or questions  Continue to follow a low  salt diet. Don't stop seizure medications abruptly, continue to take as prescribed.  Stay hydrated Stand up slowly, avoid overheating, and avoid crossing your legs when sitting.        Plan:The patient has been provided with contact information for the care management team and has been advised to call with any health related questions or concerns.  The care management team will reach out to the patient again over the next 14 days.  Quinn Plowman RN,BSN,CCM RN Case Manager [place]  [phone]

## 2021-12-09 ENCOUNTER — Telehealth: Payer: Medicare Other

## 2021-12-12 ENCOUNTER — Ambulatory Visit (INDEPENDENT_AMBULATORY_CARE_PROVIDER_SITE_OTHER): Payer: Medicare Other

## 2021-12-12 DIAGNOSIS — G40909 Epilepsy, unspecified, not intractable, without status epilepticus: Secondary | ICD-10-CM

## 2021-12-12 DIAGNOSIS — R296 Repeated falls: Secondary | ICD-10-CM

## 2021-12-12 DIAGNOSIS — I10 Essential (primary) hypertension: Secondary | ICD-10-CM

## 2021-12-12 NOTE — Patient Instructions (Signed)
Visit Information ? ?Thank you for taking time to visit with me today. Please don't hesitate to contact me if I can be of assistance to you before our next scheduled telephone appointment. ? ?Following are the goals we discussed today:  ?Take medications as prescribed   ?Attend all scheduled provider appointments ?Call pharmacy for medication refills 3-7 days in advance of running out of medications ?Call provider office for new concerns or questions  ?Continue to follow a low salt diet. ?Don't stop seizure medications abruptly, continue to take as prescribed.  ?Stay hydrated ?Stand up slowly, avoid overheating, and avoid crossing your legs when sitting.  ?Reschedule appointment for B12 injection ? ?Our next appointment is by telephone on 02/27/22 at 10:00am ? ?Please call the care guide team at [phone] if you need to cancel or reschedule your appointment.  ? ?If you are experiencing a Mental Health or [place] or need someone to talk to, please call the Suicide and Crisis Lifeline: 988 ?call 1-800-273-TALK (toll free, 24 hour hotline)  ? ?Patient verbalizes understanding of instructions and care plan provided today and agrees to view in [place]. Active MyChart status confirmed with patient.   ? ?Quinn Plowman RN,BSN,CCM ?RN Case Manager ?[place]  ?[phone] ? ?

## 2021-12-12 NOTE — Chronic Care Management (AMB) (Signed)
Chronic Care Management   CCM RN Visit Note  12/12/2021 Name: Justin Ford MRN: [id number] DOB: June 10, 1930  Subjective: Justin Ford is a 88 y.o. year old male who is a primary care patient of [name], MD. The care management team was consulted for assistance with disease management and care coordination needs.    Engaged with patient by telephone for follow up visit in response to provider referral for case management and/or care coordination services.   Consent to Services:  The patient was given information about Chronic Care Management services, agreed to services, and gave verbal consent prior to initiation of services.  Please see initial visit note for detailed documentation.   Patient agreed to services and verbal consent obtained.   Assessment: Review of patient past medical history, allergies, medications, health status, including review of consultants reports, laboratory and other test data, was performed as part of comprehensive evaluation and provision of chronic care management services.   SDOH (Social Determinants of Health) assessments and interventions performed:    CCM Care Plan  Allergies  Allergen Reactions   Lisinopril Other (See Comments)    Elevated creatinine-caution on dosing.   Lipitor [Atorvastatin] Other (See Comments)    Reaction:  Unknown     Outpatient Encounter Medications as of 12/12/2021  Medication Sig   amLODipine (NORVASC) 5 MG tablet TAKE 1 TABLET BY MOUTH EVERY DAY   aspirin EC 81 MG tablet Take 81 mg by mouth daily. Swallow whole.   cyanocobalamin (,VITAMIN B-12,) 1000 MCG/ML injection 1032mg IM every 2 weeks as of 10/07/2021   levETIRAcetam (KEPPRA) 500 MG tablet Take 500 mg by mouth 2 (two) times daily.   pantoprazole (PROTONIX) 40 MG tablet TAKE 1 TABLET BY MOUTH EVERY DAY   rosuvastatin (CRESTOR) 20 MG tablet TAKE 1 TABLET BY MOUTH EVERY DAY   sertraline (ZOLOFT) 50 MG tablet TAKE 1 TABLET BY MOUTH EVERY DAY   Insulin  Syringe-Needle U-100 (B-D INSULIN SYRINGE 1CC/25GX1") 25G X 1" 1 ML MISC Use monthly for B12 injection   No facility-administered encounter medications on file as of 12/12/2021.    Patient Active Problem List   Diagnosis Date Noted   Vertigo 03/05/2021   Depression 12/04/2020   History of hip fracture 11/02/2020   Epilepsy undetermined as to focal or generalized ([place] 11/02/2020   Insomnia 08/07/2020   Dysarthria 08/07/2020   Gait abnormality 05/15/2020   Angiodysplasia of intestinal tract    Polyp of colon    Melena    Angiodysplasia of stomach    Right foot drop 02/28/2017   Left leg swelling 10/30/2016   Gout 10/11/2016   B12 deficiency 05/21/2016   Bruising 05/21/2016   Tremor 03/08/2016   Hamstring strain 11/05/2015   Multiple falls 11/05/2015   CVA (cerebral infarction) 09/23/2015   Lower back pain 09/12/2015   Shoulder pain 09/12/2015   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   TIA (transient ischemic attack) 06/21/2015   Hyperglycemia 08/09/2013   Greater trochanteric bursitis 08/09/2013   Trigger finger 06/21/2012   Advance directive on file 03/22/2012   Medicare annual wellness visit, subsequent 03/15/2012   Dupuytren contracture 03/15/2012   Cough 12/11/2011   Barrett esophagus 10/12/2011   Chronic kidney disease, stage III (moderate) ([place] 10/12/2011   HTN (hypertension) 09/27/2011   HLD (hyperlipidemia) 09/27/2011   GERD (gastroesophageal reflux disease) 09/27/2011    Conditions to be addressed/monitored:HTN and Seizures, Falls  Care Plan : General Plan of Care (Adult)  Updates made by GNyoka Cowden  Delice Lesch, RN since 12/12/2021 12:00 AM     Problem: Chronic care management education and/ or care coodination needs.   Priority: High     Long-Range Goal: Development of plan of care to address chronic case management needs and / or care coordination   Start Date: 09/15/2021  Expected End Date: 01/30/2022  Recent Progress: On track  Priority: High   Note:   Current Barriers:  Chronic Disease Management support and education needs related to HTN and Seizures Telephone call with patients daughter, [name].  Medications reviewed with daughter. She reports patient is doing well.   RNCM Clinical Goal(s):  Patient will verbalize understanding of plan for management of HTN and Falls, Seizures as evidenced by caregiver report and/ or notation in chart.  take all medications exactly as prescribed and will call provider for medication related questions as evidenced by  caregiver report and/ or notation in chart    attend all scheduled medical appointments:   as evidenced by  caregiver report and/ or notation in chart.         continue to work with Consulting civil engineer and/or Social Worker to address care management and care coordination needs related to HTN and seizure/ falls as evidenced by adherence to CM Team Scheduled appointments     through collaboration with Consulting civil engineer, provider, and care team.   Interventions: 1:1 collaboration with primary care provider regarding development and update of comprehensive plan of care as evidenced by provider attestation and co-signature Inter-disciplinary care team collaboration (see longitudinal plan of care) Evaluation of current treatment plan related to  self management and patient's adherence to plan as established by provider   Falls Interventions:  (Status:  Goal on track:  Yes.) Long Term Goal Reviewed medications and discussed potential side effects of medications such as dizziness and frequent urination Advised patient of importance of notifying provider of falls Assessed for falls since last encounter Assessed patients knowledge of fall risk prevention secondary to previously provided education Discussed fall prevention   Seizures  (Status:  Goal on track:  Yes.)  Long Term Goal Evaluation of current treatment plan related to HTN and Falls, seizures ,  self-management and patient's  adherence to plan as established by provider. Discussed plans with patient for ongoing care management follow up and provided patient with direct contact information for care management team Evaluation of current treatment plan related to seizures and patient's adherence to plan as established by provider Reviewed medications with patient and discussed   Reviewed scheduled/upcoming provider appointments including Discussed plans with patient for ongoing care management follow up and provided patient with direct contact information for care management team  Hypertension Interventions:  (Status:  Goal on track:  Yes.) Long Term Goal Last practice recorded BP readings:  BP Readings from Last 3 Encounters:  03/04/21 132/82  12/02/20 122/78  11/04/20 127/76  Most recent eGFR/CrCl: No results found for: EGFR  No components found for: CRCL  Evaluation of current treatment plan related to hypertension self management and patient's adherence to plan as established by provider Reviewed medications with patient and discussed importance of compliance Discussed plans with patient for ongoing care management follow up and provided patient with direct contact information for care management team Reviewed scheduled/upcoming provider appointments including:   Advised to call provider office for new concerns/ symptoms. Advised to continue to follow low salt diet.  Advised to reschedule appointment for B12 injection  Patient Goals/Self-Care Activities: Take medications as prescribed  Attend all scheduled provider appointments Call pharmacy for medication refills 3-7 days in advance of running out of medications Call provider office for new concerns or questions  Continue to follow a low salt diet. Don't stop seizure medications abruptly, continue to take as prescribed.  Stay hydrated Stand up slowly, avoid overheating, and avoid crossing your legs when sitting.  Reschedule appointment for B12  injection       Plan:The patient has been provided with contact information for the care management team and has been advised to call with any health related questions or concerns.  The care management team will reach out to the patient again over the next 2 months. Quinn Plowman RN,BSN,CCM RN Case Manager [place]  [phone]

## 2021-12-23 ENCOUNTER — Other Ambulatory Visit (INDEPENDENT_AMBULATORY_CARE_PROVIDER_SITE_OTHER): Payer: Medicare Other

## 2021-12-23 ENCOUNTER — Other Ambulatory Visit: Payer: Self-pay

## 2021-12-23 DIAGNOSIS — I10 Essential (primary) hypertension: Secondary | ICD-10-CM

## 2021-12-23 DIAGNOSIS — E785 Hyperlipidemia, unspecified: Secondary | ICD-10-CM | POA: Diagnosis not present

## 2021-12-23 DIAGNOSIS — M109 Gout, unspecified: Secondary | ICD-10-CM | POA: Diagnosis not present

## 2021-12-23 DIAGNOSIS — E538 Deficiency of other specified B group vitamins: Secondary | ICD-10-CM | POA: Diagnosis not present

## 2021-12-23 LAB — CBC WITH DIFFERENTIAL/PLATELET
Basophils Absolute: 0.1 10*3/uL (ref 0.0–0.1)
Basophils Relative: 1 % (ref 0.0–3.0)
Eosinophils Absolute: 0.2 10*3/uL (ref 0.0–0.7)
Eosinophils Relative: 3.2 % (ref 0.0–5.0)
HCT: 36.6 % — ABNORMAL LOW (ref 39.0–52.0)
Hemoglobin: 11.9 g/dL — ABNORMAL LOW (ref 13.0–17.0)
Lymphocytes Relative: 31.1 % (ref 12.0–46.0)
Lymphs Abs: 1.6 10*3/uL (ref 0.7–4.0)
MCHC: 32.5 g/dL (ref 30.0–36.0)
MCV: 80.6 fl (ref 78.0–100.0)
Monocytes Absolute: 0.3 10*3/uL (ref 0.1–1.0)
Monocytes Relative: 5.2 % (ref 3.0–12.0)
Neutro Abs: 3.1 10*3/uL (ref 1.4–7.7)
Neutrophils Relative %: 59.5 % (ref 43.0–77.0)
Platelets: 182 10*3/uL (ref 150.0–400.0)
RBC: 4.54 Mil/uL (ref 4.22–5.81)
RDW: 15.5 % (ref 11.5–15.5)
WBC: 5.2 10*3/uL (ref 4.0–10.5)

## 2021-12-23 LAB — COMPREHENSIVE METABOLIC PANEL
ALT: 13 U/L (ref 0–53)
AST: 17 U/L (ref 0–37)
Albumin: 4.5 g/dL (ref 3.5–5.2)
Alkaline Phosphatase: 137 U/L — ABNORMAL HIGH (ref 39–117)
BUN: 15 mg/dL (ref 6–23)
CO2: 25 mEq/L (ref 19–32)
Calcium: 9.7 mg/dL (ref 8.4–10.5)
Chloride: 106 mEq/L (ref 96–112)
Creatinine, Ser: 1.85 mg/dL — ABNORMAL HIGH (ref 0.40–1.50)
GFR: 31.91 mL/min — ABNORMAL LOW (ref >60.00)
Glucose, Bld: 138 mg/dL — ABNORMAL HIGH (ref 70–99)
Potassium: 4.3 mEq/L (ref 3.5–5.1)
Sodium: 139 mEq/L (ref 135–145)
Total Bilirubin: 0.5 mg/dL (ref 0.2–1.2)
Total Protein: 8.4 g/dL — ABNORMAL HIGH (ref 6.0–8.3)

## 2021-12-23 LAB — URIC ACID: Uric Acid, Serum: 6 mg/dL (ref 4.0–7.8)

## 2021-12-23 LAB — LIPID PANEL
Cholesterol: 128 mg/dL (ref 0–200)
HDL: 63.3 mg/dL (ref >39.00)
LDL Cholesterol: 42 mg/dL (ref 0–99)
NonHDL: 64.83
Total CHOL/HDL Ratio: 2
Triglycerides: 115 mg/dL (ref 0.0–149.0)
VLDL: 23 mg/dL (ref 0.0–40.0)

## 2021-12-23 LAB — VITAMIN B12: Vitamin B-12: 415 pg/mL (ref 211–911)

## 2021-12-24 ENCOUNTER — Other Ambulatory Visit: Payer: Self-pay | Admitting: Family Medicine

## 2022-02-17 DIAGNOSIS — Z961 Presence of intraocular lens: Secondary | ICD-10-CM | POA: Diagnosis not present

## 2022-02-27 ENCOUNTER — Ambulatory Visit (INDEPENDENT_AMBULATORY_CARE_PROVIDER_SITE_OTHER): Payer: Medicare Other

## 2022-02-27 DIAGNOSIS — I10 Essential (primary) hypertension: Secondary | ICD-10-CM

## 2022-02-27 DIAGNOSIS — G40909 Epilepsy, unspecified, not intractable, without status epilepticus: Secondary | ICD-10-CM

## 2022-02-27 DIAGNOSIS — R296 Repeated falls: Secondary | ICD-10-CM

## 2022-02-27 NOTE — Patient Instructions (Signed)
Visit Information  Thank you for taking time to visit with me today. Please don't hesitate to contact me if I can be of assistance to you before our next scheduled telephone appointment.  Following are the goals we discussed today:  Goals met.   Closed to chronic care management services.   Please call the care guide team at [phone] if you need to cancel or reschedule your appointment.   If you are experiencing a Mental Health or [place] or need someone to talk to, please call the Suicide and Crisis Lifeline: 988 call 1-800-273-TALK (toll free, 24 hour hotline)   Patient verbalizes understanding of instructions and care plan provided today and agrees to view in [place]. Active MyChart status and patient understanding of how to access instructions and care plan via MyChart confirmed with patient.     Quinn Plowman RN,BSN,CCM RN Case Manager [place]  [phone]

## 2022-02-27 NOTE — Chronic Care Management (AMB) (Signed)
Chronic Care Management   CCM RN Visit Note  02/27/2022 Name: Justin Ford MRN: [id number] DOB: December 04, 1929  Subjective: Justin Ford is a 88 y.o. year old male who is a primary care patient of [name], MD. The care management team was consulted for assistance with disease management and care coordination needs.    Engaged with patient by telephone for follow up visit in response to provider referral for case management and/or care coordination services.   Consent to Services:  The patient was given information about Chronic Care Management services, agreed to services, and gave verbal consent prior to initiation of services.  Please see initial visit note for detailed documentation.   Patient agreed to services and verbal consent obtained.   Assessment: Review of patient past medical history, allergies, medications, health status, including review of consultants reports, laboratory and other test data, was performed as part of comprehensive evaluation and provision of chronic care management services.   SDOH (Social Determinants of Health) assessments and interventions performed:    CCM Care Plan  Allergies  Allergen Reactions   Lisinopril Other (See Comments)    Elevated creatinine-caution on dosing.   Lipitor [Atorvastatin] Other (See Comments)    Reaction:  Unknown     Outpatient Encounter Medications as of 02/27/2022  Medication Sig   amLODipine (NORVASC) 5 MG tablet TAKE 1 TABLET BY MOUTH EVERY DAY   aspirin EC 81 MG tablet Take 81 mg by mouth daily. Swallow whole.   cyanocobalamin (,VITAMIN B-12,) 1000 MCG/ML injection 1010mg IM every 2 weeks as of 10/07/2021   Insulin Syringe-Needle U-100 (B-D INSULIN SYRINGE 1CC/25GX1") 25G X 1" 1 ML MISC Use monthly for B12 injection   levETIRAcetam (KEPPRA) 500 MG tablet Take 500 mg by mouth 2 (two) times daily.   pantoprazole (PROTONIX) 40 MG tablet TAKE 1 TABLET BY MOUTH EVERY DAY   rosuvastatin (CRESTOR) 20 MG tablet  TAKE 1 TABLET BY MOUTH EVERY DAY   sertraline (ZOLOFT) 50 MG tablet TAKE 1 TABLET BY MOUTH EVERY DAY   No facility-administered encounter medications on file as of 02/27/2022.    Patient Active Problem List   Diagnosis Date Noted   Vertigo 03/05/2021   Depression 12/04/2020   History of hip fracture 11/02/2020   Epilepsy undetermined as to focal or generalized ([place] 11/02/2020   Insomnia 08/07/2020   Dysarthria 08/07/2020   Gait abnormality 05/15/2020   Angiodysplasia of intestinal tract    Polyp of colon    Melena    Angiodysplasia of stomach    Right foot drop 02/28/2017   Left leg swelling 10/30/2016   Gout 10/11/2016   B12 deficiency 05/21/2016   Bruising 05/21/2016   Tremor 03/08/2016   Hamstring strain 11/05/2015   Multiple falls 11/05/2015   CVA (cerebral infarction) 09/23/2015   Lower back pain 09/12/2015   Shoulder pain 09/12/2015   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   TIA (transient ischemic attack) 06/21/2015   Hyperglycemia 08/09/2013   Greater trochanteric bursitis 08/09/2013   Trigger finger 06/21/2012   Advance directive on file 03/22/2012   Medicare annual wellness visit, subsequent 03/15/2012   Dupuytren contracture 03/15/2012   Cough 12/11/2011   Barrett esophagus 10/12/2011   Chronic kidney disease, stage III (moderate) ([place] 10/12/2011   HTN (hypertension) 09/27/2011   HLD (hyperlipidemia) 09/27/2011   GERD (gastroesophageal reflux disease) 09/27/2011    Conditions to be addressed/monitored:HTN and Falls, seizure  Care Plan : General Plan of Care (Adult)  Updates made by GNyoka Cowden  Delice Lesch, RN since 02/27/2022 12:00 AM     Problem: Chronic care management education and/ or care coodination needs.   Priority: High     Long-Range Goal: Development of plan of care to address chronic case management needs and / or care coordination   Start Date: 09/15/2021  Expected End Date: 01/30/2022  Recent Progress: On track  Priority: High   Note:   Goals met.  Closed to CCM services.  Current Barriers:  Chronic Disease Management support and education needs related to HTN and Seizures Telephone call with patients daughter, Justin Ford.  HIPAA verified by daughter for patient. Daughter states patient is doing well. She denies any seizures/ falls or high blood pressure symptoms.  She reports patient continues to take his medications as prescribed. Denies any upcoming provider appointments. Daughter in agreement to close patient to CCM services.  RNCM Clinical Goal(s):  Patient will verbalize understanding of plan for management of HTN and Falls, Seizures as evidenced by caregiver report and/ or notation in chart.  take all medications exactly as prescribed and will call provider for medication related questions as evidenced by  caregiver report and/ or notation in chart    attend all scheduled medical appointments:   as evidenced by  caregiver report and/ or notation in chart.         continue to work with Consulting civil engineer and/or Social Worker to address care management and care coordination needs related to HTN and seizure/ falls as evidenced by adherence to CM Team Scheduled appointments     through collaboration with Consulting civil engineer, provider, and care team.   Interventions: 1:1 collaboration with primary care provider regarding development and update of comprehensive plan of care as evidenced by provider attestation and co-signature Inter-disciplinary care team collaboration (see longitudinal plan of care) Evaluation of current treatment plan related to  self management and patient's adherence to plan as established by provider   Falls Interventions:  (Status:  Goal Met.) Long Term Goal Reviewed medications and discussed potential side effects of medications such as dizziness and frequent urination Advised patient of importance of notifying provider of falls Assessed for falls since last encounter Assessed patients knowledge  of fall risk prevention secondary to previously provided education Discussed fall prevention   Seizures  (Status:  Goal Met.)  Long Term Goal Evaluation of current treatment plan related to HTN and Falls, seizures ,  self-management and patient's adherence to plan as established by provider. Discussed plans with patient for ongoing care management follow up and provided patient with direct contact information for care management team Evaluation of current treatment plan related to seizures and patient's adherence to plan as established by provider Reviewed medications with patient and discussed   Reviewed scheduled/upcoming provider appointments including Discussed plans with patient for ongoing care management follow up and provided patient with direct contact information for care management team  Hypertension Interventions:  (Status:  Goal Met.) Long Term Goal Last practice recorded BP readings:  BP Readings from Last 3 Encounters:  10/07/21 120/64  03/04/21 132/82  12/02/20 122/78  Most recent eGFR/CrCl: No results found for: EGFR  No components found for: CRCL  Evaluation of current treatment plan related to hypertension self management and patient's adherence to plan as established by provider Reviewed medications with patient and discussed importance of compliance Discussed plans with patient for ongoing care management follow up and provided patient with direct contact information for care management team Reviewed scheduled/upcoming provider appointments including:  Advised to call provider office for new concerns/ symptoms. Advised to continue to follow low salt diet.  Advised to reschedule appointment for B12 injection  Patient Goals/Self-Care Activities: Take medications as prescribed   Attend all scheduled provider appointments Call pharmacy for medication refills 3-7 days in advance of running out of medications Call provider office for new concerns or questions   Continue to follow a low salt diet. Don't stop seizure medications abruptly, continue to take as prescribed.  Stay hydrated Stand up slowly, avoid overheating, and avoid crossing your legs when sitting.  Reschedule appointment for B12 injection       Quinn Plowman RN,BSN,CCM RN Case Manager [place]  [phone]

## 2022-03-04 DIAGNOSIS — I10 Essential (primary) hypertension: Secondary | ICD-10-CM

## 2022-03-11 ENCOUNTER — Other Ambulatory Visit: Payer: Self-pay | Admitting: Family Medicine

## 2022-03-16 DIAGNOSIS — D2262 Melanocytic nevi of left upper limb, including shoulder: Secondary | ICD-10-CM | POA: Diagnosis not present

## 2022-03-16 DIAGNOSIS — L570 Actinic keratosis: Secondary | ICD-10-CM | POA: Diagnosis not present

## 2022-03-16 DIAGNOSIS — Z85828 Personal history of other malignant neoplasm of skin: Secondary | ICD-10-CM | POA: Diagnosis not present

## 2022-03-16 DIAGNOSIS — D2272 Melanocytic nevi of left lower limb, including hip: Secondary | ICD-10-CM | POA: Diagnosis not present

## 2022-03-16 DIAGNOSIS — D225 Melanocytic nevi of trunk: Secondary | ICD-10-CM | POA: Diagnosis not present

## 2022-03-28 ENCOUNTER — Other Ambulatory Visit: Payer: Self-pay | Admitting: Family Medicine

## 2022-06-29 DIAGNOSIS — R569 Unspecified convulsions: Secondary | ICD-10-CM | POA: Diagnosis not present

## 2022-06-29 DIAGNOSIS — Z8673 Personal history of transient ischemic attack (TIA), and cerebral infarction without residual deficits: Secondary | ICD-10-CM | POA: Diagnosis not present

## 2022-06-29 DIAGNOSIS — E538 Deficiency of other specified B group vitamins: Secondary | ICD-10-CM | POA: Diagnosis not present

## 2022-06-30 ENCOUNTER — Other Ambulatory Visit: Payer: Self-pay | Admitting: Family Medicine

## 2022-07-04 DIAGNOSIS — Z23 Encounter for immunization: Secondary | ICD-10-CM | POA: Diagnosis not present

## 2022-07-29 ENCOUNTER — Other Ambulatory Visit: Payer: Self-pay

## 2022-07-29 MED ORDER — AMLODIPINE BESYLATE 5 MG PO TABS: 5.0000 mg | ORAL_TABLET | Freq: Every day | ORAL | 0 refills | Status: DC

## 2022-07-29 MED ORDER — PANTOPRAZOLE SODIUM 40 MG PO TBEC: 40.0000 mg | DELAYED_RELEASE_TABLET | Freq: Every day | ORAL | 0 refills | Status: DC

## 2022-07-29 MED ORDER — SERTRALINE HCL 50 MG PO TABS: 50.0000 mg | ORAL_TABLET | Freq: Every day | ORAL | 0 refills | Status: DC

## 2022-07-29 MED ORDER — ROSUVASTATIN CALCIUM 20 MG PO TABS: 20.0000 mg | ORAL_TABLET | Freq: Every day | ORAL | 0 refills | Status: DC

## 2022-09-13 ENCOUNTER — Other Ambulatory Visit: Payer: Self-pay | Admitting: Family Medicine

## 2022-09-14 NOTE — Telephone Encounter (Signed)
Patient is due for a f/u; please call to schedule.

## 2022-09-15 NOTE — Telephone Encounter (Signed)
Patient is scheduled for appt 09/22/22

## 2022-09-15 NOTE — Telephone Encounter (Signed)
LVM for patient to call back and schedule

## 2022-09-22 ENCOUNTER — Ambulatory Visit (INDEPENDENT_AMBULATORY_CARE_PROVIDER_SITE_OTHER): Payer: Medicare Other | Admitting: Family Medicine

## 2022-09-22 ENCOUNTER — Encounter: Payer: Self-pay | Admitting: Family Medicine

## 2022-09-22 VITALS — BP 142/60 | HR 55 | Temp 96.7°F

## 2022-09-22 DIAGNOSIS — E538 Deficiency of other specified B group vitamins: Secondary | ICD-10-CM | POA: Diagnosis not present

## 2022-09-22 DIAGNOSIS — I10 Essential (primary) hypertension: Secondary | ICD-10-CM

## 2022-09-22 DIAGNOSIS — R269 Unspecified abnormalities of gait and mobility: Secondary | ICD-10-CM | POA: Diagnosis not present

## 2022-09-22 DIAGNOSIS — M7062 Trochanteric bursitis, left hip: Secondary | ICD-10-CM

## 2022-09-22 DIAGNOSIS — G40909 Epilepsy, unspecified, not intractable, without status epilepticus: Secondary | ICD-10-CM

## 2022-09-22 DIAGNOSIS — D649 Anemia, unspecified: Secondary | ICD-10-CM | POA: Diagnosis not present

## 2022-09-22 DIAGNOSIS — K921 Melena: Secondary | ICD-10-CM | POA: Diagnosis not present

## 2022-09-22 DIAGNOSIS — E785 Hyperlipidemia, unspecified: Secondary | ICD-10-CM

## 2022-09-22 LAB — COMPREHENSIVE METABOLIC PANEL
ALT: 10 U/L (ref 0–53)
AST: 14 U/L (ref 0–37)
Albumin: 4.5 g/dL (ref 3.5–5.2)
Alkaline Phosphatase: 110 U/L (ref 39–117)
BUN: 23 mg/dL (ref 6–23)
CO2: 25 mEq/L (ref 19–32)
Calcium: 9.9 mg/dL (ref 8.4–10.5)
Chloride: 108 mEq/L (ref 96–112)
Creatinine, Ser: 1.65 mg/dL — ABNORMAL HIGH (ref 0.40–1.50)
GFR: 36.41 mL/min — ABNORMAL LOW (ref >60.00)
Glucose, Bld: 89 mg/dL (ref 70–99)
Potassium: 4.8 mEq/L (ref 3.5–5.1)
Sodium: 139 mEq/L (ref 135–145)
Total Bilirubin: 0.5 mg/dL (ref 0.2–1.2)
Total Protein: 8.2 g/dL (ref 6.0–8.3)

## 2022-09-22 LAB — CBC WITH DIFFERENTIAL/PLATELET
Basophils Absolute: 0 10*3/uL (ref 0.0–0.1)
Basophils Relative: 0.8 % (ref 0.0–3.0)
Eosinophils Absolute: 0.2 10*3/uL (ref 0.0–0.7)
Eosinophils Relative: 3 % (ref 0.0–5.0)
HCT: 35.8 % — ABNORMAL LOW (ref 39.0–52.0)
Hemoglobin: 11.5 g/dL — ABNORMAL LOW (ref 13.0–17.0)
Lymphocytes Relative: 27.2 % (ref 12.0–46.0)
Lymphs Abs: 1.5 10*3/uL (ref 0.7–4.0)
MCHC: 32.1 g/dL (ref 30.0–36.0)
MCV: 76.3 fl — ABNORMAL LOW (ref 78.0–100.0)
Monocytes Absolute: 0.4 10*3/uL (ref 0.1–1.0)
Monocytes Relative: 6.4 % (ref 3.0–12.0)
Neutro Abs: 3.5 10*3/uL (ref 1.4–7.7)
Neutrophils Relative %: 62.6 % (ref 43.0–77.0)
Platelets: 189 10*3/uL (ref 150.0–400.0)
RBC: 4.69 Mil/uL (ref 4.22–5.81)
RDW: 16.2 % — ABNORMAL HIGH (ref 11.5–15.5)
WBC: 5.5 10*3/uL (ref 4.0–10.5)

## 2022-09-22 LAB — LIPID PANEL
Cholesterol: 131 mg/dL (ref 0–200)
HDL: 59.3 mg/dL (ref >39.00)
LDL Cholesterol: 51 mg/dL (ref 0–99)
NonHDL: 71.7
Total CHOL/HDL Ratio: 2
Triglycerides: 105 mg/dL (ref 0.0–149.0)
VLDL: 21 mg/dL (ref 0.0–40.0)

## 2022-09-22 LAB — VITAMIN B12: Vitamin B-12: 222 pg/mL (ref 211–911)

## 2022-09-22 LAB — FERRITIN: Ferritin: 11.8 ng/mL — ABNORMAL LOW (ref 22.0–322.0)

## 2022-09-22 LAB — IRON: Iron: 35 ug/dL — ABNORMAL LOW (ref 42–165)

## 2022-09-22 NOTE — Patient Instructions (Addendum)
Go to the lab on the way out.   If you have mychart we'll likely use that to update you.    Take care.  Glad to see you. Likely trochanteric bursitis.  Use ice for 5 minutes at a time.

## 2022-09-22 NOTE — Progress Notes (Unsigned)
History of B12 def. No recent B12 dose.  Last level was normal. Due for labs.  See notes on labs.  Had a flu and covid shot.   Still on keppra w/o events in the meantime.    Using rollator at baseline. No falls except when carrying too much weight in the house (he was loading soda in the fridge).  Cautions discussed with patient.  Hypertension:    Using medication without problems or lightheadedness: yes Chest pain with exertion:no Edema: no Short of breath:no  Some leg pain at night, some L hip pain. Takes tylenol at baseline.  He has pain at the L troch bursa area at night, pain laying on L side.    H/o anemia noted.  No blood seen in stools or urine but h/o black stools.  Recheck labs pending.  Meds, vitals, and allergies reviewed.   ROS: Per HPI unless specifically indicated in ROS section   GEN: nad, alert and oriented HEENT: ncat NECK: supple w/o LA CV: rrr PULM: ctab, no inc wob ABD: soft, +bs EXT: no edema SKIN: Well-perfused Normal left hip range of motion but pain at the left greater trochanteric area.

## 2022-09-23 ENCOUNTER — Other Ambulatory Visit: Payer: Self-pay | Admitting: Family Medicine

## 2022-09-23 DIAGNOSIS — D649 Anemia, unspecified: Secondary | ICD-10-CM

## 2022-09-23 MED ORDER — IRON (FERROUS SULFATE) 325 (65 FE) MG PO TABS: 325.0000 mg | ORAL_TABLET | Freq: Every day | ORAL | Status: AC

## 2022-09-23 MED ORDER — CYANOCOBALAMIN 1000 MCG/ML IJ SOLN: 1000.0000 ug | INTRAMUSCULAR | Status: DC

## 2022-09-23 NOTE — Assessment & Plan Note (Signed)
Fall cautions discussed with patient.  Continue using rollator.  See above.

## 2022-09-23 NOTE — Assessment & Plan Note (Signed)
Likely, discussed icing for 5 minutes at a time.  Update me as needed.

## 2022-09-23 NOTE — Assessment & Plan Note (Signed)
See notes on labs.  Routine cautions given to patient.

## 2022-09-23 NOTE — Assessment & Plan Note (Signed)
Continue Crestor.  See notes on labs.

## 2022-09-23 NOTE — Assessment & Plan Note (Signed)
No recent events.  Compliant with Keppra.  Continue as is.

## 2022-09-23 NOTE — Assessment & Plan Note (Signed)
See notes on labs. 

## 2022-09-23 NOTE — Assessment & Plan Note (Signed)
Continue amlodipine.  See notes on labs. °

## 2022-09-26 ENCOUNTER — Other Ambulatory Visit: Payer: Self-pay | Admitting: Family Medicine

## 2022-09-28 ENCOUNTER — Other Ambulatory Visit: Payer: Self-pay | Admitting: Family Medicine

## 2022-09-29 ENCOUNTER — Telehealth: Payer: Self-pay | Admitting: Family Medicine

## 2022-09-29 NOTE — Telephone Encounter (Signed)
Patient returned call from 12/22 regarding his labs he would ,ike a call back.

## 2022-09-29 NOTE — Telephone Encounter (Signed)
Spoke with patient about lab results.

## 2022-10-02 ENCOUNTER — Other Ambulatory Visit (INDEPENDENT_AMBULATORY_CARE_PROVIDER_SITE_OTHER): Payer: Medicare Other

## 2022-10-02 ENCOUNTER — Telehealth: Payer: Self-pay

## 2022-10-02 ENCOUNTER — Ambulatory Visit (INDEPENDENT_AMBULATORY_CARE_PROVIDER_SITE_OTHER): Payer: Medicare Other

## 2022-10-02 DIAGNOSIS — K921 Melena: Secondary | ICD-10-CM

## 2022-10-02 DIAGNOSIS — E538 Deficiency of other specified B group vitamins: Secondary | ICD-10-CM | POA: Diagnosis not present

## 2022-10-02 LAB — FECAL OCCULT BLOOD, IMMUNOCHEMICAL: Fecal Occult Bld: NEGATIVE

## 2022-10-02 MED ORDER — CYANOCOBALAMIN 1000 MCG/ML IJ SOLN
1000.0000 ug | Freq: Once | INTRAMUSCULAR | Status: AC
  Administered 2022-10-02: 1000 ug via INTRAMUSCULAR

## 2022-10-02 NOTE — Telephone Encounter (Signed)
[place] Night - Client Nonclinical Telephone Record  [facility] Client [facility] Night - Client Client Site [place] Provider AA - PHYSICIAN, NOT LISTED- MD Contact Type Call Who Is Calling Patient / Member / Family / Caregiver Caller Name Point MacKenzie Phone Number [phone] Patient Name Justin Ford Patient DOB 1930-03-25 Call Type Message Only Information Provided Reason for Call Request to [place] Appointment Initial Comment Caller states he received a call from the office stating he has an appointment tomorrow. Patient request to speak to RN No Additional Comment He did not have any knowledge of the appointments. He does not think he can make the appointment because he does not have a ride. Provided office hours. Disp. Time Disposition Final User 10/01/2022 5:18:40 PM General Information Provided Yes Ignatowski, Tiffany Call Closed By: Gaye Pollack Transaction Date/Time: 10/01/2022 5:16:25 PM (ET

## 2022-10-02 NOTE — Progress Notes (Signed)
Per orders of Dr. Graham Duncan, injection of Vitamin B 12 given in left deltoid given by Reanne Nellums. Patient tolerated injection well.  

## 2022-10-02 NOTE — Telephone Encounter (Signed)
Pts daughter has already brought pt this morning for nurse visit; Vitamin B 12 injection. Nothing further needed.

## 2022-10-18 ENCOUNTER — Other Ambulatory Visit: Payer: Self-pay | Admitting: Family Medicine

## 2022-10-30 ENCOUNTER — Other Ambulatory Visit: Payer: Medicare Other

## 2022-11-05 ENCOUNTER — Other Ambulatory Visit (INDEPENDENT_AMBULATORY_CARE_PROVIDER_SITE_OTHER): Payer: Medicare Other

## 2022-11-05 ENCOUNTER — Ambulatory Visit (INDEPENDENT_AMBULATORY_CARE_PROVIDER_SITE_OTHER): Payer: Medicare Other

## 2022-11-05 DIAGNOSIS — E538 Deficiency of other specified B group vitamins: Secondary | ICD-10-CM | POA: Diagnosis not present

## 2022-11-05 DIAGNOSIS — D649 Anemia, unspecified: Secondary | ICD-10-CM

## 2022-11-05 LAB — CBC WITH DIFFERENTIAL/PLATELET
Basophils Absolute: 0 10*3/uL (ref 0.0–0.1)
Basophils Relative: 0.5 % (ref 0.0–3.0)
Eosinophils Absolute: 0.2 10*3/uL (ref 0.0–0.7)
Eosinophils Relative: 3.1 % (ref 0.0–5.0)
HCT: 38.3 % — ABNORMAL LOW (ref 39.0–52.0)
Hemoglobin: 12.5 g/dL — ABNORMAL LOW (ref 13.0–17.0)
Lymphocytes Relative: 29 % (ref 12.0–46.0)
Lymphs Abs: 1.4 10*3/uL (ref 0.7–4.0)
MCHC: 32.6 g/dL (ref 30.0–36.0)
MCV: 82.3 fl (ref 78.0–100.0)
Monocytes Absolute: 0.3 10*3/uL (ref 0.1–1.0)
Monocytes Relative: 6.7 % (ref 3.0–12.0)
Neutro Abs: 3 10*3/uL (ref 1.4–7.7)
Neutrophils Relative %: 60.7 % (ref 43.0–77.0)
Platelets: 160 10*3/uL (ref 150.0–400.0)
RBC: 4.66 Mil/uL (ref 4.22–5.81)
RDW: 22.3 % — ABNORMAL HIGH (ref 11.5–15.5)
WBC: 4.9 10*3/uL (ref 4.0–10.5)

## 2022-11-05 LAB — BASIC METABOLIC PANEL
BUN: 18 mg/dL (ref 6–23)
CO2: 25 mEq/L (ref 19–32)
Calcium: 9.2 mg/dL (ref 8.4–10.5)
Chloride: 107 mEq/L (ref 96–112)
Creatinine, Ser: 1.73 mg/dL — ABNORMAL HIGH (ref 0.40–1.50)
GFR: 34.37 mL/min — ABNORMAL LOW (ref >60.00)
Glucose, Bld: 92 mg/dL (ref 70–99)
Potassium: 4.4 mEq/L (ref 3.5–5.1)
Sodium: 138 mEq/L (ref 135–145)

## 2022-11-05 LAB — FERRITIN: Ferritin: 21.7 ng/mL — ABNORMAL LOW (ref 22.0–322.0)

## 2022-11-05 LAB — IRON: Iron: 73 ug/dL (ref 42–165)

## 2022-11-05 MED ORDER — CYANOCOBALAMIN 1000 MCG/ML IJ SOLN
1000.0000 ug | Freq: Once | INTRAMUSCULAR | Status: AC
  Administered 2022-11-05: 1000 ug via INTRAMUSCULAR

## 2022-11-05 NOTE — Progress Notes (Signed)
Per orders of Dr. Duncan, injection of vit B12 given by Phyllis Abelson. Patient tolerated injection well.  

## 2022-11-06 ENCOUNTER — Other Ambulatory Visit: Payer: Medicare Other

## 2022-11-08 ENCOUNTER — Other Ambulatory Visit: Payer: Self-pay | Admitting: Family Medicine

## 2022-11-08 DIAGNOSIS — E538 Deficiency of other specified B group vitamins: Secondary | ICD-10-CM

## 2022-11-08 DIAGNOSIS — D649 Anemia, unspecified: Secondary | ICD-10-CM

## 2022-11-10 ENCOUNTER — Ambulatory Visit (INDEPENDENT_AMBULATORY_CARE_PROVIDER_SITE_OTHER): Payer: Medicare Other

## 2022-11-10 VITALS — Wt 160.0 lb

## 2022-11-10 DIAGNOSIS — Z0000 Encounter for general adult medical examination without abnormal findings: Secondary | ICD-10-CM | POA: Diagnosis not present

## 2022-11-10 NOTE — Patient Instructions (Signed)
Justin Ford , Thank you for taking time to come for your Medicare Wellness Visit. I appreciate your ongoing commitment to your health goals. Please review the following plan we discussed and let me know if I can assist you in the future.   These are the goals we discussed:  Goals      Patient Stated     Like to drink a glass of alcohol         This is a list of the screening recommended for you and due dates:  Health Maintenance  Topic Date Due   Zoster (Shingles) Vaccine (1 of 2) Never done   DTaP/Tdap/Td vaccine (3 - Td or Tdap) 03/15/2022   COVID-19 Vaccine (6 - 2023-24 season) 08/29/2022   Colon Cancer Screening  04/19/2023   Medicare Annual Wellness Visit  11/11/2023   Pneumonia Vaccine  Completed   Flu Shot  Completed   HPV Vaccine  Aged Out    Advanced directives: Please bring a copy of your health care power of attorney and living will to the office at your convenience.  Conditions/risks identified: to be able to get a drink   Next appointment: Follow up in one year for your annual wellness visit.   Preventive Care 43 Years and Older, Male  Preventive care refers to lifestyle choices and visits with your health care provider that can promote health and wellness. What does preventive care include? A yearly physical exam. This is also called an annual well check. Dental exams once or twice a year. Routine eye exams. Ask your health care provider how often you should have your eyes checked. Personal lifestyle choices, including: Daily care of your teeth and gums. Regular physical activity. Eating a healthy diet. Avoiding tobacco and drug use. Limiting alcohol use. Practicing safe sex. Taking low doses of aspirin every day. Taking vitamin and mineral supplements as recommended by your health care provider. What happens during an annual well check? The services and screenings done by your health care provider during your annual well check will depend on your age,  overall health, lifestyle risk factors, and family history of disease. Counseling  Your health care provider may ask you questions about your: Alcohol use. Tobacco use. Drug use. Emotional well-being. Home and relationship well-being. Sexual activity. Eating habits. History of falls. Memory and ability to understand (cognition). Work and work Statistician. Screening  You may have the following tests or measurements: Height, weight, and BMI. Blood pressure. Lipid and cholesterol levels. These may be checked every 5 years, or more frequently if you are over 24 years old. Skin check. Lung cancer screening. You may have this screening every year starting at age 19 if you have a 30-pack-year history of smoking and currently smoke or have quit within the past 15 years. Fecal occult blood test (FOBT) of the stool. You may have this test every year starting at age 74. Flexible sigmoidoscopy or colonoscopy. You may have a sigmoidoscopy every 5 years or a colonoscopy every 10 years starting at age 33. Prostate cancer screening. Recommendations will vary depending on your family history and other risks. Hepatitis C blood test. Hepatitis B blood test. Sexually transmitted disease (STD) testing. Diabetes screening. This is done by checking your blood sugar (glucose) after you have not eaten for a while (fasting). You may have this done every 1-3 years. Abdominal aortic aneurysm (AAA) screening. You may need this if you are a current or former smoker. Osteoporosis. You may be screened starting at age  70 if you are at high risk. Talk with your health care provider about your test results, treatment options, and if necessary, the need for more tests. Vaccines  Your health care provider may recommend certain vaccines, such as: Influenza vaccine. This is recommended every year. Tetanus, diphtheria, and acellular pertussis (Tdap, Td) vaccine. You may need a Td booster every 10 years. Zoster vaccine.  You may need this after age 93. Pneumococcal 13-valent conjugate (PCV13) vaccine. One dose is recommended after age 10. Pneumococcal polysaccharide (PPSV23) vaccine. One dose is recommended after age 20. Talk to your health care provider about which screenings and vaccines you need and how often you need them. This information is not intended to replace advice given to you by your health care provider. Make sure you discuss any questions you have with your health care provider. Document Released: 10/18/2015 Document Revised: 06/10/2016 Document Reviewed: 07/23/2015 Elsevier Interactive Patient Education  2017 [place] Prevention in the Home Falls can cause injuries. They can happen to people of all ages. There are many things you can do to make your home safe and to help prevent falls. What can I do on the outside of my home? Regularly fix the edges of walkways and driveways and fix any cracks. Remove anything that might make you trip as you walk through a door, such as a raised step or threshold. Trim any bushes or trees on the path to your home. Use bright outdoor lighting. Clear any walking paths of anything that might make someone trip, such as rocks or tools. Regularly check to see if handrails are loose or broken. Make sure that both sides of any steps have handrails. Any raised decks and porches should have guardrails on the edges. Have any leaves, snow, or ice cleared regularly. Use sand or salt on walking paths during winter. Clean up any spills in your garage right away. This includes oil or grease spills. What can I do in the bathroom? Use night lights. Install grab bars by the toilet and in the tub and shower. Do not use towel bars as grab bars. Use non-skid mats or decals in the tub or shower. If you need to sit down in the shower, use a plastic, non-slip stool. Keep the floor dry. Clean up any water that spills on the floor as soon as it happens. Remove soap  buildup in the tub or shower regularly. Attach bath mats securely with double-sided non-slip rug tape. Do not have throw rugs and other things on the floor that can make you trip. What can I do in the bedroom? Use night lights. Make sure that you have a light by your bed that is easy to reach. Do not use any sheets or blankets that are too big for your bed. They should not hang down onto the floor. Have a firm chair that has side arms. You can use this for support while you get dressed. Do not have throw rugs and other things on the floor that can make you trip. What can I do in the kitchen? Clean up any spills right away. Avoid walking on wet floors. Keep items that you use a lot in easy-to-reach places. If you need to reach something above you, use a strong step stool that has a grab bar. Keep electrical cords out of the way. Do not use floor polish or wax that makes floors slippery. If you must use wax, use non-skid floor wax. Do not have throw rugs and other  things on the floor that can make you trip. What can I do with my stairs? Do not leave any items on the stairs. Make sure that there are handrails on both sides of the stairs and use them. Fix handrails that are broken or loose. Make sure that handrails are as long as the stairways. Check any carpeting to make sure that it is firmly attached to the stairs. Fix any carpet that is loose or worn. Avoid having throw rugs at the top or bottom of the stairs. If you do have throw rugs, attach them to the floor with carpet tape. Make sure that you have a light switch at the top of the stairs and the bottom of the stairs. If you do not have them, ask someone to add them for you. What else can I do to help prevent falls? Wear shoes that: Do not have high heels. Have rubber bottoms. Are comfortable and fit you well. Are closed at the toe. Do not wear sandals. If you use a stepladder: Make sure that it is fully opened. Do not climb a closed  stepladder. Make sure that both sides of the stepladder are locked into place. Ask someone to hold it for you, if possible. Clearly mark and make sure that you can see: Any grab bars or handrails. First and last steps. Where the edge of each step is. Use tools that help you move around (mobility aids) if they are needed. These include: Canes. Walkers. Scooters. Crutches. Turn on the lights when you go into a dark area. Replace any light bulbs as soon as they burn out. Set up your furniture so you have a clear path. Avoid moving your furniture around. If any of your floors are uneven, fix them. If there are any pets around you, be aware of where they are. Review your medicines with your doctor. Some medicines can make you feel dizzy. This can increase your chance of falling. Ask your doctor what other things that you can do to help prevent falls. This information is not intended to replace advice given to you by your health care provider. Make sure you discuss any questions you have with your health care provider. Document Released: 07/18/2009 Document Revised: 02/27/2016 Document Reviewed: 10/26/2014 Elsevier Interactive Patient Education  2017 [employer].

## 2022-11-10 NOTE — Progress Notes (Signed)
I connected with  Mercy Moore on 11/10/22 by a audio enabled telemedicine application and verified that I am speaking with the correct person using two identifiers.daughter Darliss Cheney   Patient Location: Home  Provider Location: Office/Clinic  I discussed the limitations of evaluation and management by telemedicine. The patient expressed understanding and agreed to proceed.   Subjective:   Justin Ford is a 89 y.o. male who presents for Medicare Annual/Subsequent preventive examination.  Review of Systems     Cardiac Risk Factors include: advanced age (>65mn, >>67women);male gender;hypertension;dyslipidemia     Objective:    Today's Vitals   11/10/22 0749  Weight: 160 lb (72.6 kg)   Body mass index is 24.33 kg/m.     11/10/2022    7:55 AM 11/22/2020    1:14 PM 11/02/2020   10:41 PM 11/02/2020    3:33 PM 07/31/2020   10:40 AM 07/27/2020    4:29 PM 07/27/2020    4:27 PM  Advanced Directives  Does Patient Have a Medical Advance Directive? Yes Yes Yes Yes Yes  Yes  Type of AParamedicof AKalihiwaiLiving will Healthcare Power of AVernalof Attorney Living will;Healthcare Power of Attorney  Living will;Healthcare Power of Attorney  Does patient want to make changes to medical advance directive?   No - Patient declined    No - Patient declined  Copy of [place] Chart? No - copy requested  No - copy requested  No - copy requested No - copy requested   Would patient like information on creating a medical advance directive?      No - Patient declined     Current Medications (verified) Outpatient Encounter Medications as of 11/10/2022  Medication Sig   amLODipine (NORVASC) 5 MG tablet TAKE 1 TABLET BY MOUTH DAILY   aspirin EC 81 MG tablet Take 81 mg by mouth daily. Swallow whole.   cyanocobalamin (VITAMIN B12) 1000 MCG/ML injection Inject 1 mL (1,000 mcg total) into the muscle every  30 (thirty) days.   Insulin Syringe-Needle U-100 (B-D INSULIN SYRINGE 1CC/25GX1") 25G X 1" 1 ML MISC Use monthly for B12 injection   Iron, Ferrous Sulfate, 325 (65 Fe) MG TABS Take 325 mg by mouth daily.   levETIRAcetam (KEPPRA) 500 MG tablet Take 500 mg by mouth 2 (two) times daily.   pantoprazole (PROTONIX) 40 MG tablet TAKE 1 TABLET BY MOUTH DAILY   rosuvastatin (CRESTOR) 20 MG tablet TAKE 1 TABLET BY MOUTH DAILY   sertraline (ZOLOFT) 50 MG tablet TAKE 1 TABLET BY MOUTH DAILY   No facility-administered encounter medications on file as of 11/10/2022.    Allergies (verified) Lisinopril and Lipitor [atorvastatin]   History: Past Medical History:  Diagnosis Date   Barrett's esophagus    Blood in stool    colonoscopy done ~2010   Cancer (HCC)    basal cell CA per derm   GERD (gastroesophageal reflux disease)    Hyperlipidemia    Hypertension    Kidney stones    TIA (transient ischemic attack)    2016   Past Surgical History:  Procedure Laterality Date   COLONOSCOPY WITH PROPOFOL N/A 04/18/2020   Procedure: COLONOSCOPY WITH PROPOFOL;  Surgeon: TVirgel Manifold MD;  Location: [facility];  Service: Endoscopy;  Laterality: N/A;   ESOPHAGOGASTRODUODENOSCOPY (EGD) WITH PROPOFOL N/A 03/07/2020   Procedure: ESOPHAGOGASTRODUODENOSCOPY (EGD) WITH PROPOFOL;  Surgeon: TVirgel Manifold MD;  Location: [facility];  Service:  Endoscopy;  Laterality: N/A;   EYE SURGERY     bilateral cataract   INTRAMEDULLARY (IM) NAIL INTERTROCHANTERIC Right 11/02/2020   Procedure: INTRAMEDULLARY (IM) NAIL INTERTROCHANTRIC;  Surgeon: Nida Boatman, MD;  Location: [facility] ORS;  Service: Orthopedics;  Laterality: Right;   PROSTATE BIOPSY     neg x2 per patient   SHOULDER SURGERY     per Dr. Theda Sers, R shoulder   Family History  Problem Relation Age of Onset   Dementia Mother    Alcohol abuse Father    Heart disease Brother    Cancer Sister        possible breast cancer, pt was unsure   Colon  cancer Neg Hx    Prostate cancer Neg Hx    Social History   Socioeconomic History   Marital status: Married    Spouse name: Not on file   Number of children: Not on file   Years of education: Not on file   Highest education level: Not on file  Occupational History   Occupation: retired    Comment: Optometrist  Tobacco Use   Smoking status: Former    Packs/day: 0.25    Years: 4.00    Total pack years: 1.00    Types: Cigarettes    Quit date: 10/05/1950    Years since quitting: 72.1   Smokeless tobacco: Never  Vaping Use   Vaping Use: Never used  Substance and Sexual Activity   Alcohol use: Not Currently    Alcohol/week: 2.0 standard drinks of alcohol    Types: 2 Cans of beer per week   Drug use: No   Sexual activity: Never  Other Topics Concern   Not on file  Social History Narrative   Married 1951   From [place].    Retired Optometrist   2 kids, 2 grandkids (grandkids are Radiation protection practitioner)   Social Determinants of Radio broadcast assistant Strain: [place]  (11/10/2022)   Overall Financial Resource Strain (CARDIA)    Difficulty of Paying Living Expenses: Not hard at all  Food Insecurity: No Food Insecurity (11/10/2022)   Hunger Vital Sign    Worried About Running Out of Food in the Last Year: Never true    [place] in the Last Year: Never true  Transportation Needs: No Transportation Needs (11/10/2022)   PRAPARE - Hydrologist (Medical): No    Lack of Transportation (Non-Medical): No  Physical Activity: Inactive (11/10/2022)   Exercise Vital Sign    Days of Exercise per Week: 0 days    Minutes of Exercise per Session: 0 min  Stress: No Stress Concern Present (11/10/2022)   [place]    Feeling of Stress : Not at all  Social Connections: Moderately Isolated (11/10/2022)   Social Connection and Isolation Panel [NHANES]    Frequency of Communication  with Friends and Family: More than three times a week    Frequency of Social Gatherings with Friends and Family: More than three times a week    Attends Religious Services: Never    Marine scientist or Organizations: No    Attends Music therapist: Never    Marital Status: Married    Tobacco Counseling Counseling given: Not Answered   Clinical Intake:  Pre-visit preparation completed: Yes  Pain : No/denies pain     Diabetes: No  How often do you need to  have someone help you when you read instructions, pamphlets, or other written materials from your doctor or pharmacy?: 1 - Never  Diabetic?no  Interpreter Needed?: No  Information entered by :: Charlott Rakes, LPN   Activities of Daily Living    11/10/2022    7:57 AM  In your present state of health, do you have any difficulty performing the following activities:  Hearing? 1  Comment hearing aids  Vision? 0  Difficulty concentrating or making decisions? 0  Walking or climbing stairs? 0  Dressing or bathing? 0  Doing errands, shopping? 0  Preparing Food and eating ? N  Using the Toilet? N  In the past six months, have you accidently leaked urine? N  Do you have problems with loss of bowel control? N  Managing your Medications? N  Managing your Finances? N  Housekeeping or managing your Housekeeping? N    Patient Care Team: [name], MD as PCP - General (Family Medicine) Vladimir Crofts, MD (Neurology) Tat, Eustace Quail, DO as Consulting Physician (Neurology) Dasher, Rayvon Char, MD as Consulting Physician (Dermatology) Estill Cotta, MD as Consulting Physician (Ophthalmology) Duard Brady as Consulting Physician (Dentistry) Dannielle Karvonen, RN as [place] any recent [place] you may have received from other than [facility] providers in the past year (date may be approximate).     Assessment:   This is a routine wellness examination for  [facility].  Hearing/Vision screen Hearing Screening - Comments:: Wears a hearing aids  Vision Screening - Comments:: Pt follows up with Dr Lynnda Shields at [facility] for eye exams   Dietary issues and exercise activities discussed: Current Exercise Habits: The patient does not participate in regular exercise at present   Goals Addressed             This Visit's Progress    Patient Stated       To be able to get a drink        Depression Screen    11/10/2022    7:53 AM 10/07/2021    4:17 PM 12/13/2017   10:51 AM 09/15/2016   10:42 AM 08/08/2013    2:21 PM  PHQ 2/9 Scores  PHQ - 2 Score 0 0 0 0 0    Fall Risk    11/10/2022    7:57 AM 09/22/2022   11:58 AM 11/22/2020    8:59 PM 11/22/2020    1:18 PM 08/29/2019    1:24 PM  Fall Risk   Falls in the past year? 0 0  1 0  Comment     Emmi Telephone Survey: data to providers prior to load  Number falls in past yr: 0 0  1   Injury with Fall? 0 0  1   Risk for fall due to : Impaired vision;Impaired balance/gait History of fall(s);Impaired balance/gait Impaired balance/gait;History of fall(s)    Follow up Falls prevention discussed Falls evaluation completed Education provided;Follow up appointment      FALL RISK PREVENTION PERTAINING TO THE HOME:  Any stairs in or around the home? Yes  If so, are there any without handrails? No  Home free of loose throw rugs in walkways, pet beds, electrical cords, etc? Yes  Adequate lighting in your home to reduce risk of falls? Yes   ASSISTIVE DEVICES UTILIZED TO PREVENT FALLS:  Life alert? No  Use of a cane, walker or w/c? Yes  Grab bars in the bathroom? No  Shower chair or  bench in shower? Yes  Elevated toilet seat or a handicapped toilet? Yes   TIMED UP AND GO:  Was the test performed? No .  Cognitive Function:    09/15/2016   10:59 AM  MMSE - Mini Mental State Exam  Orientation to time 5  Orientation to Place 5  Registration 3  Attention/ Calculation 0  Recall 1   Recall-comments pt was unable to recall 2 of 3 words  Language- name 2 objects 0  Language- repeat 1  Language- follow 3 step command 3  Language- read & follow direction 0  Write a sentence 0  Copy design 0  Total score 18        11/10/2022    7:59 AM  6CIT Screen  What Year? 0 points  What month? 0 points  What time? 0 points  Count back from 20 0 points  Months in reverse 0 points  Repeat phrase 4 points  Total Score 4 points    Immunizations Immunization History  Administered Date(s) Administered   DT (Pediatric) 03/15/2012   Fluad Quad(high Dose 65+) 07/17/2020   Influenza, High Dose Seasonal PF 06/14/2014, 06/08/2019, 06/08/2019, 06/28/2021, 07/04/2022   Influenza,inj,Quad PF,6+ Mos 08/08/2013, 06/27/2015, 07/14/2016, 06/30/2017, 07/12/2018   Influenza,inj,quad, With Preservative 10/05/2016   Influenza-Unspecified 07/06/2011   PFIZER Comirnaty(Gray Top)Covid-19 Tri-Sucrose Vaccine 07/04/2022   PFIZER(Purple Top)SARS-COV-2 Vaccination 11/08/2019, 11/29/2019, 07/01/2020   Pfizer Covid-19 Vaccine Bivalent Booster 61yr & up 06/28/2021   Pneumococcal Conjugate-13 08/23/2014   Pneumococcal Polysaccharide-23 09/15/2016   Pneumococcal-Unspecified 10/05/2016   Tdap 03/15/2012   Zoster, Live 06/14/2012    TDAP status: Due, Education has been provided regarding the importance of this vaccine. Advised may receive this vaccine at local pharmacy or Health Dept. Aware to provide a copy of the vaccination record if obtained from local pharmacy or Health Dept. Verbalized acceptance and understanding.  Flu Vaccine status: Up to date  Pneumococcal vaccine status: Up to date  Covid-19 vaccine status: Completed vaccines  Qualifies for Shingles Vaccine? Yes   Zostavax completed No   Shingrix Completed?: No.    Education has been provided regarding the importance of this vaccine. Patient has been advised to call insurance company to determine out of pocket expense if they have not  yet received this vaccine. Advised may also receive vaccine at local pharmacy or Health Dept. Verbalized acceptance and understanding.  Screening Tests Health Maintenance  Topic Date Due   Zoster Vaccines- Shingrix (1 of 2) Never done   DTaP/Tdap/Td (3 - Td or Tdap) 03/15/2022   COVID-19 Vaccine (6 - 2023-24 season) 08/29/2022   COLONOSCOPY (Pts 45-475yrInsurance coverage will need to be confirmed)  04/19/2023   Medicare Annual Wellness (AWV)  11/11/2023   Pneumonia Vaccine 6536Years old  Completed   INFLUENZA VACCINE  Completed   HPV VALindaintenance Due  Topic Date Due   Zoster Vaccines- Shingrix (1 of 2) Never done   DTaP/Tdap/Td (3 - Td or Tdap) 03/15/2022   COVID-19 Vaccine (6 - 2023-24 season) 08/29/2022    Colorectal cancer screening: No longer required.   Additional Screening:  Vision Screening: Recommended annual ophthalmology exams for early detection of glaucoma and other disorders of the eye. Is the patient up to date with their annual eye exam?  Yes  Who is the provider or what is the name of the office in which the patient attends annual eye exams? Dr Dingledien  If pt is not  established with a provider, would they like to be referred to a provider to establish care? No .   Dental Screening: Recommended annual dental exams for proper oral hygiene  Community Resource Referral / Chronic Care Management: CRR required this visit?  No   CCM required this visit?  No      Plan:     I have personally reviewed and noted the following in the patient's chart:   Medical and social history Use of alcohol, tobacco or illicit drugs  Current medications and supplements including opioid prescriptions. Patient is not currently taking opioid prescriptions. Functional ability and status Nutritional status Physical activity Advanced directives List of other physicians Hospitalizations, surgeries, and ER visits in previous 12  months Vitals Screenings to include cognitive, depression, and falls Referrals and appointments  In addition, I have reviewed and discussed with patient certain preventive protocols, quality metrics, and best practice recommendations. A written personalized care plan for preventive services as well as general preventive health recommendations were provided to patient.     Willette Brace, LPN   06/11/6733   Nurse Notes: none

## 2022-11-17 ENCOUNTER — Other Ambulatory Visit: Payer: Self-pay | Admitting: Family Medicine

## 2022-12-08 ENCOUNTER — Ambulatory Visit: Payer: Medicare Other

## 2022-12-09 ENCOUNTER — Ambulatory Visit (INDEPENDENT_AMBULATORY_CARE_PROVIDER_SITE_OTHER): Payer: Medicare Other

## 2022-12-09 DIAGNOSIS — E538 Deficiency of other specified B group vitamins: Secondary | ICD-10-CM | POA: Diagnosis not present

## 2022-12-09 MED ORDER — CYANOCOBALAMIN 1000 MCG/ML IJ SOLN
1000.0000 ug | Freq: Once | INTRAMUSCULAR | Status: AC
  Administered 2022-12-09: 1000 ug via INTRAMUSCULAR

## 2022-12-09 NOTE — Progress Notes (Addendum)
Per orders of Dr. Ria Bush, injection of B12 given by Pat Kocher in left deltoid. Patient tolerated injection well. Patient will make appointment for 1 month.

## 2023-01-12 ENCOUNTER — Ambulatory Visit (INDEPENDENT_AMBULATORY_CARE_PROVIDER_SITE_OTHER): Payer: Medicare Other

## 2023-01-12 ENCOUNTER — Other Ambulatory Visit (INDEPENDENT_AMBULATORY_CARE_PROVIDER_SITE_OTHER): Payer: Medicare Other

## 2023-01-12 DIAGNOSIS — D649 Anemia, unspecified: Secondary | ICD-10-CM | POA: Diagnosis not present

## 2023-01-12 DIAGNOSIS — E538 Deficiency of other specified B group vitamins: Secondary | ICD-10-CM | POA: Diagnosis not present

## 2023-01-12 LAB — BASIC METABOLIC PANEL
BUN: 25 mg/dL — ABNORMAL HIGH (ref 6–23)
CO2: 25 mEq/L (ref 19–32)
Calcium: 9.4 mg/dL (ref 8.4–10.5)
Chloride: 106 mEq/L (ref 96–112)
Creatinine, Ser: 1.64 mg/dL — ABNORMAL HIGH (ref 0.40–1.50)
GFR: 36.6 mL/min — ABNORMAL LOW (ref >60.00)
Glucose, Bld: 96 mg/dL (ref 70–99)
Potassium: 4.3 mEq/L (ref 3.5–5.1)
Sodium: 140 mEq/L (ref 135–145)

## 2023-01-12 LAB — CBC WITH DIFFERENTIAL/PLATELET
Basophils Absolute: 0.1 10*3/uL (ref 0.0–0.1)
Basophils Relative: 1.1 % (ref 0.0–3.0)
Eosinophils Absolute: 0.1 10*3/uL (ref 0.0–0.7)
Eosinophils Relative: 3 % (ref 0.0–5.0)
HCT: 43.3 % (ref 39.0–52.0)
Hemoglobin: 14.2 g/dL (ref 13.0–17.0)
Lymphocytes Relative: 27.3 % (ref 12.0–46.0)
Lymphs Abs: 1.3 10*3/uL (ref 0.7–4.0)
MCHC: 32.8 g/dL (ref 30.0–36.0)
MCV: 87.5 fl (ref 78.0–100.0)
Monocytes Absolute: 0.2 10*3/uL (ref 0.1–1.0)
Monocytes Relative: 5.1 % (ref 3.0–12.0)
Neutro Abs: 3.1 10*3/uL (ref 1.4–7.7)
Neutrophils Relative %: 63.5 % (ref 43.0–77.0)
Platelets: 155 10*3/uL (ref 150.0–400.0)
RBC: 4.95 Mil/uL (ref 4.22–5.81)
RDW: 16.1 % — ABNORMAL HIGH (ref 11.5–15.5)
WBC: 4.9 10*3/uL (ref 4.0–10.5)

## 2023-01-12 LAB — IRON: Iron: 101 ug/dL (ref 42–165)

## 2023-01-12 LAB — VITAMIN B12: Vitamin B-12: 339 pg/mL (ref 211–911)

## 2023-01-12 LAB — FERRITIN: Ferritin: 42.9 ng/mL (ref 22.0–322.0)

## 2023-01-12 MED ORDER — CYANOCOBALAMIN 1000 MCG/ML IJ SOLN
1000.0000 ug | Freq: Once | INTRAMUSCULAR | Status: AC
  Administered 2023-01-12: 1000 ug via INTRAMUSCULAR

## 2023-01-12 NOTE — Progress Notes (Signed)
Per orders of Dr. Graham Duncan, injection of Vitamin B 12 given in right deltoid given by Anora Schwenke. Patient tolerated injection well.  

## 2023-01-24 ENCOUNTER — Other Ambulatory Visit: Payer: Self-pay | Admitting: Family Medicine

## 2023-01-24 DIAGNOSIS — M109 Gout, unspecified: Secondary | ICD-10-CM

## 2023-01-24 DIAGNOSIS — I10 Essential (primary) hypertension: Secondary | ICD-10-CM

## 2023-01-25 ENCOUNTER — Other Ambulatory Visit (INDEPENDENT_AMBULATORY_CARE_PROVIDER_SITE_OTHER): Payer: Medicare Other

## 2023-01-25 DIAGNOSIS — I10 Essential (primary) hypertension: Secondary | ICD-10-CM | POA: Diagnosis not present

## 2023-01-25 DIAGNOSIS — M109 Gout, unspecified: Secondary | ICD-10-CM

## 2023-01-25 LAB — LIPID PANEL
Cholesterol: 117 mg/dL (ref 0–200)
HDL: 46.4 mg/dL (ref >39.00)
LDL Cholesterol: 44 mg/dL (ref 0–99)
NonHDL: 70.75
Total CHOL/HDL Ratio: 3
Triglycerides: 136 mg/dL (ref 0.0–149.0)
VLDL: 27.2 mg/dL (ref 0.0–40.0)

## 2023-01-25 LAB — URIC ACID: Uric Acid, Serum: 5.5 mg/dL (ref 4.0–7.8)

## 2023-01-25 LAB — TSH: TSH: 2.22 u[IU]/mL (ref 0.35–5.50)

## 2023-02-01 ENCOUNTER — Ambulatory Visit (INDEPENDENT_AMBULATORY_CARE_PROVIDER_SITE_OTHER): Payer: Medicare Other | Admitting: Family Medicine

## 2023-02-01 ENCOUNTER — Encounter: Payer: Self-pay | Admitting: Family Medicine

## 2023-02-01 VITALS — BP 130/80 | HR 63 | Temp 97.6°F | Ht 68.0 in | Wt 158.0 lb

## 2023-02-01 DIAGNOSIS — I10 Essential (primary) hypertension: Secondary | ICD-10-CM | POA: Diagnosis not present

## 2023-02-01 DIAGNOSIS — F32A Depression, unspecified: Secondary | ICD-10-CM

## 2023-02-01 DIAGNOSIS — Z0000 Encounter for general adult medical examination without abnormal findings: Secondary | ICD-10-CM

## 2023-02-01 DIAGNOSIS — Z789 Other specified health status: Secondary | ICD-10-CM

## 2023-02-01 DIAGNOSIS — K2270 Barrett's esophagus without dysplasia: Secondary | ICD-10-CM | POA: Diagnosis not present

## 2023-02-01 DIAGNOSIS — D649 Anemia, unspecified: Secondary | ICD-10-CM

## 2023-02-01 DIAGNOSIS — E538 Deficiency of other specified B group vitamins: Secondary | ICD-10-CM | POA: Diagnosis not present

## 2023-02-01 DIAGNOSIS — G40909 Epilepsy, unspecified, not intractable, without status epilepticus: Secondary | ICD-10-CM

## 2023-02-01 DIAGNOSIS — R251 Tremor, unspecified: Secondary | ICD-10-CM

## 2023-02-01 DIAGNOSIS — E785 Hyperlipidemia, unspecified: Secondary | ICD-10-CM

## 2023-02-01 NOTE — Patient Instructions (Addendum)
Ask Dr. Sherryll Burger about your tremor.  Take care.  Glad to see you. Recheck in about 6 months.  Labs ahead of time or at the visit.  Don't change your meds for now.

## 2023-02-01 NOTE — Progress Notes (Unsigned)
I have personally reviewed the Medicare Annual Wellness questionnaire and have noted 1. The patient's medical and social history 2. Their use of alcohol, tobacco or illicit drugs 3. Their current medications and supplements 4. The patient's functional ability including ADL's, fall risks, home safety risks and hearing or visual             impairment. 5. Diet and physical activities 6. Evidence for depression or mood disorders  The patients weight, height, BMI have been recorded in the chart and visual acuity is per eye clinic.  I have made referrals, counseling and provided education to the patient based review of the above and I have provided the pt with a written personalized care plan for preventive services.  Provider list updated- see scanned forms.  Routine anticipatory guidance given to patient.  See health maintenance. The possibility exists that previously documented standard health maintenance information may have been brought forward from a previous encounter into this note.  If needed, that same information has been updated to reflect the current situation based on today's encounter.    Flu Shingles PNA Tetanus Colon  Breast cancer screening Prostate cancer screening Advance directive Cognitive function addressed- see scanned forms- and if abnormal then additional documentation follows.   In addition to [facility] Wellness, follow up visit for the below conditions:  B hand tremor.  Episodic.  Affected his handwriting.    Mood is better on sertraline and discussed continuing as is.    H/o anemia. B12 def on replacement.  Still on iron.  Black stools but not constipated.    Hypertension:    Using medication without problems or lightheadedness: yes Chest pain with exertion: no Edema:no Short of breath:no  History of seizure disorder.  He isn't driving.  Still on keppra.  Compliant.    Elevated Cholesterol: Using medications without problems: yes Muscle aches: some  aches in the legs.  Tolerable.  More if laying on L side at night.   Diet compliance: d/w pt.   Exercise: d/w pt.   H/o Barretts on PPI at baseline.  Swallowing well.    Fall cautions d/w pt.  Using rollator, encouraged [place].    PMH and SH reviewed  Meds, vitals, and allergies reviewed.   ROS: Per HPI.  Unless specifically indicated otherwise in HPI, the patient denies:  General: fever. Eyes: acute vision changes ENT: sore throat Cardiovascular: chest pain Respiratory: SOB GI: vomiting GU: dysuria Musculoskeletal: acute back pain Derm: acute rash Neuro: acute motor dysfunction Psych: worsening mood Endocrine: polydipsia Heme: bleeding Allergy: hayfever  GEN: nad, alert and oriented HEENT: mucous membranes moist NECK: supple w/o LA CV: rrr. PULM: ctab, no inc wob ABD: soft, +bs EXT: no edema SKIN: no acute rash

## 2023-02-03 NOTE — Assessment & Plan Note (Signed)
Advance directive- daughter Daphna Schwartz designated if patient were incapacitated.  

## 2023-02-03 NOTE — Assessment & Plan Note (Signed)
Would continue Crestor.  He is having more pain of lying on the left side at night and I suspect this is related to trochanteric bursitis, not his statin.

## 2023-02-03 NOTE — Assessment & Plan Note (Signed)
History of seizure disorder.  He isn't driving.  Still on keppra.  Compliant.   I will defer to neurology.  No recent events.

## 2023-02-03 NOTE — Assessment & Plan Note (Signed)
Would continue pantoprazole as is.

## 2023-02-03 NOTE — Assessment & Plan Note (Signed)
H/o anemia. B12 def on replacement.  Still on iron.  Black stools but not constipated.  I would continue iron and B12 as is and recheck labs in about 6 months.

## 2023-02-03 NOTE — Assessment & Plan Note (Signed)
Flu 2023 Shingles previously done PNA previously done Tetanus 2013 COVID-vaccine previously done Colon cancer screening not due given his age. Prostate cancer screening not due given his age. Advance directive-daughter designated if patient were incapacitated. Cognitive function addressed- see scanned forms- and if abnormal then additional documentation follows.

## 2023-02-03 NOTE — Assessment & Plan Note (Signed)
Mood is better on sertraline and discussed continuing as is.

## 2023-02-03 NOTE — Assessment & Plan Note (Signed)
-   Continue amlodipine ?

## 2023-02-03 NOTE — Assessment & Plan Note (Signed)
Would observe for now.  No change in medications.

## 2023-02-16 ENCOUNTER — Ambulatory Visit (INDEPENDENT_AMBULATORY_CARE_PROVIDER_SITE_OTHER): Payer: Medicare Other

## 2023-02-16 DIAGNOSIS — E538 Deficiency of other specified B group vitamins: Secondary | ICD-10-CM

## 2023-02-16 MED ORDER — CYANOCOBALAMIN 1000 MCG/ML IJ SOLN
1000.0000 ug | Freq: Once | INTRAMUSCULAR | Status: AC
Start: 2023-02-16 — End: 2023-02-16
  Administered 2023-02-16: 1000 ug via INTRAMUSCULAR

## 2023-02-16 NOTE — Progress Notes (Signed)
Per orders of Dr. Graham Duncan, injection of B 12 given by Kasyn Stouffer Y Lalah Durango in right deltoid. Patient tolerated injection well.   

## 2023-03-18 DIAGNOSIS — Z85828 Personal history of other malignant neoplasm of skin: Secondary | ICD-10-CM | POA: Diagnosis not present

## 2023-03-18 DIAGNOSIS — D2272 Melanocytic nevi of left lower limb, including hip: Secondary | ICD-10-CM | POA: Diagnosis not present

## 2023-03-18 DIAGNOSIS — D2262 Melanocytic nevi of left upper limb, including shoulder: Secondary | ICD-10-CM | POA: Diagnosis not present

## 2023-03-18 DIAGNOSIS — L570 Actinic keratosis: Secondary | ICD-10-CM | POA: Diagnosis not present

## 2023-03-18 DIAGNOSIS — L538 Other specified erythematous conditions: Secondary | ICD-10-CM | POA: Diagnosis not present

## 2023-03-18 DIAGNOSIS — L820 Inflamed seborrheic keratosis: Secondary | ICD-10-CM | POA: Diagnosis not present

## 2023-03-18 DIAGNOSIS — D225 Melanocytic nevi of trunk: Secondary | ICD-10-CM | POA: Diagnosis not present

## 2023-03-18 DIAGNOSIS — D2261 Melanocytic nevi of right upper limb, including shoulder: Secondary | ICD-10-CM | POA: Diagnosis not present

## 2023-03-24 ENCOUNTER — Ambulatory Visit (INDEPENDENT_AMBULATORY_CARE_PROVIDER_SITE_OTHER): Payer: Medicare Other

## 2023-03-24 DIAGNOSIS — E538 Deficiency of other specified B group vitamins: Secondary | ICD-10-CM | POA: Diagnosis not present

## 2023-03-24 DIAGNOSIS — Z961 Presence of intraocular lens: Secondary | ICD-10-CM | POA: Diagnosis not present

## 2023-03-24 DIAGNOSIS — L729 Follicular cyst of the skin and subcutaneous tissue, unspecified: Secondary | ICD-10-CM | POA: Diagnosis not present

## 2023-03-24 MED ORDER — CYANOCOBALAMIN 1000 MCG/ML IJ SOLN
1000.0000 ug | Freq: Once | INTRAMUSCULAR | Status: AC
Start: 2023-03-24 — End: 2023-03-24
  Administered 2023-03-24: 1000 ug via INTRAMUSCULAR

## 2023-03-24 NOTE — Progress Notes (Signed)
Per orders of Dr. Crawford Givens who is out of office and Dr Alyson Ingles who is in office, injection of vitamin b 12 given by Lewanda Rife in left deltoid. Patient tolerated injection well. Patient will make appointment for 1 month.

## 2023-04-22 ENCOUNTER — Ambulatory Visit: Payer: Medicare Other

## 2023-04-22 DIAGNOSIS — E538 Deficiency of other specified B group vitamins: Secondary | ICD-10-CM | POA: Diagnosis not present

## 2023-04-22 MED ORDER — CYANOCOBALAMIN 1000 MCG/ML IJ SOLN
1000.0000 ug | Freq: Once | INTRAMUSCULAR | Status: AC
Start: 2023-04-22 — End: 2023-04-22
  Administered 2023-04-22: 1000 ug via INTRAMUSCULAR

## 2023-04-22 NOTE — Progress Notes (Signed)
Per orders of Dr. Duncan, injection of vit B12 given by Felicia  Goins. Patient tolerated injection well.  

## 2023-05-05 ENCOUNTER — Encounter (INDEPENDENT_AMBULATORY_CARE_PROVIDER_SITE_OTHER): Payer: Self-pay

## 2023-05-25 ENCOUNTER — Ambulatory Visit: Payer: Medicare Other

## 2023-05-27 ENCOUNTER — Ambulatory Visit: Payer: Medicare Other

## 2023-06-03 ENCOUNTER — Ambulatory Visit (INDEPENDENT_AMBULATORY_CARE_PROVIDER_SITE_OTHER): Payer: Medicare Other

## 2023-06-03 DIAGNOSIS — E538 Deficiency of other specified B group vitamins: Secondary | ICD-10-CM | POA: Diagnosis not present

## 2023-06-03 MED ORDER — CYANOCOBALAMIN 1000 MCG/ML IJ SOLN
1000.0000 ug | Freq: Once | INTRAMUSCULAR | Status: AC
Start: 2023-06-03 — End: 2023-06-03
  Administered 2023-06-03: 1000 ug via INTRAMUSCULAR

## 2023-06-03 NOTE — Progress Notes (Signed)
 Per orders of Dr. Crawford Givens, injection of B-12 given by Nanci Pina in left deltoid. Patient tolerated injection well.

## 2023-07-05 DIAGNOSIS — R569 Unspecified convulsions: Secondary | ICD-10-CM | POA: Diagnosis not present

## 2023-07-05 DIAGNOSIS — R42 Dizziness and giddiness: Secondary | ICD-10-CM | POA: Diagnosis not present

## 2023-07-05 DIAGNOSIS — Z8673 Personal history of transient ischemic attack (TIA), and cerebral infarction without residual deficits: Secondary | ICD-10-CM | POA: Diagnosis not present

## 2023-07-08 ENCOUNTER — Ambulatory Visit: Payer: Medicare Other

## 2023-07-08 DIAGNOSIS — E538 Deficiency of other specified B group vitamins: Secondary | ICD-10-CM | POA: Diagnosis not present

## 2023-07-08 MED ORDER — CYANOCOBALAMIN 1000 MCG/ML IJ SOLN
1000.0000 ug | Freq: Once | INTRAMUSCULAR | Status: AC
Start: 2023-07-08 — End: 2023-07-08
  Administered 2023-07-08: 1000 ug via INTRAMUSCULAR

## 2023-07-08 NOTE — Progress Notes (Signed)
Per orders of Dr. Graham Duncan, injection of vitamin b 12 given by Jazmon Kos in right deltoid. Patient tolerated injection well. Patient will make appointment for 1 month.   

## 2023-07-17 DIAGNOSIS — Z23 Encounter for immunization: Secondary | ICD-10-CM | POA: Diagnosis not present

## 2023-07-23 DIAGNOSIS — R2681 Unsteadiness on feet: Secondary | ICD-10-CM | POA: Diagnosis not present

## 2023-07-30 DIAGNOSIS — R2681 Unsteadiness on feet: Secondary | ICD-10-CM | POA: Diagnosis not present

## 2023-08-06 DIAGNOSIS — R2681 Unsteadiness on feet: Secondary | ICD-10-CM | POA: Diagnosis not present

## 2023-08-12 ENCOUNTER — Encounter: Payer: Self-pay | Admitting: Family Medicine

## 2023-08-12 ENCOUNTER — Ambulatory Visit: Payer: Medicare Other | Admitting: Family Medicine

## 2023-08-12 VITALS — BP 124/80 | HR 74 | Temp 97.6°F | Ht 68.0 in | Wt 157.2 lb

## 2023-08-12 DIAGNOSIS — D649 Anemia, unspecified: Secondary | ICD-10-CM

## 2023-08-12 DIAGNOSIS — K5520 Angiodysplasia of colon without hemorrhage: Secondary | ICD-10-CM

## 2023-08-12 DIAGNOSIS — H532 Diplopia: Secondary | ICD-10-CM

## 2023-08-12 DIAGNOSIS — E538 Deficiency of other specified B group vitamins: Secondary | ICD-10-CM | POA: Diagnosis not present

## 2023-08-12 LAB — CBC WITH DIFFERENTIAL/PLATELET
Basophils Absolute: 0 10*3/uL (ref 0.0–0.1)
Basophils Relative: 0.6 % (ref 0.0–3.0)
Eosinophils Absolute: 0.1 10*3/uL (ref 0.0–0.7)
Eosinophils Relative: 2.2 % (ref 0.0–5.0)
HCT: 44.4 % (ref 39.0–52.0)
Hemoglobin: 14.6 g/dL (ref 13.0–17.0)
Lymphocytes Relative: 26.2 % (ref 12.0–46.0)
Lymphs Abs: 1.2 10*3/uL (ref 0.7–4.0)
MCHC: 32.9 g/dL (ref 30.0–36.0)
MCV: 91.2 fL (ref 78.0–100.0)
Monocytes Absolute: 0.3 10*3/uL (ref 0.1–1.0)
Monocytes Relative: 5.8 % (ref 3.0–12.0)
Neutro Abs: 3.1 10*3/uL (ref 1.4–7.7)
Neutrophils Relative %: 65.2 % (ref 43.0–77.0)
Platelets: 147 10*3/uL — ABNORMAL LOW (ref 150.0–400.0)
RBC: 4.87 Mil/uL (ref 4.22–5.81)
RDW: 14 % (ref 11.5–15.5)
WBC: 4.7 10*3/uL (ref 4.0–10.5)

## 2023-08-12 LAB — BASIC METABOLIC PANEL
BUN: 16 mg/dL (ref 6–23)
CO2: 26 meq/L (ref 19–32)
Calcium: 9.6 mg/dL (ref 8.4–10.5)
Chloride: 106 meq/L (ref 96–112)
Creatinine, Ser: 1.76 mg/dL — ABNORMAL HIGH (ref 0.40–1.50)
GFR: 33.49 mL/min — ABNORMAL LOW (ref >60.00)
Glucose, Bld: 93 mg/dL (ref 70–99)
Potassium: 4.4 meq/L (ref 3.5–5.1)
Sodium: 139 meq/L (ref 135–145)

## 2023-08-12 LAB — IRON: Iron: 95 ug/dL (ref 42–165)

## 2023-08-12 LAB — FERRITIN: Ferritin: 72.8 ng/mL (ref 22.0–322.0)

## 2023-08-12 MED ORDER — CYANOCOBALAMIN 1000 MCG/ML IJ SOLN
1000.0000 ug | Freq: Once | INTRAMUSCULAR | Status: AC
Start: 2023-08-12 — End: 2023-08-12
  Administered 2023-08-12: 1000 ug via INTRAMUSCULAR

## 2023-08-12 NOTE — Progress Notes (Signed)
H/o anemia.  Recheck labs pending. No bleeding  still on iron and B12 at baseline.  B12 dose done at OV.    Prev colonoscopy with:  - A few colonic angioectasias. Treated with argon plasma coagulation ( APC) . - One 4 mm polyp in the ascending colon, removed with a cold biopsy forceps. Resected and retrieved. - One 12 mm polyp in the transverse colon, removed piecemeal using a cold snare. Resected and retrieved. Clip was placed. - Diverticulosis in the sigmoid colon. - The examination was otherwise normal. - The rectum, sigmoid colon, descending colon, transverse colon, ascending colon and cecum are normal. - The distal rectum and anal verge are normal on retroflexion view.  D/w pt and daughter about not repeating colonoscopy at this point given the risk of the procedure.  They agreed to defer.  On keppra per neuro.  He had neuro eval re: double vision, with pending vestibular rehab.  Has temporary recurrent double vision with head movement.  Double vision is vertical.  It lasts less than 10 seconds and happens with turning over in bed.  No new hearing changes but h/o hearing loss at baseline.   No falls  using walker at baseline.  No presyncope.    Meds, vitals, and allergies reviewed.   ROS: Per HPI unless specifically indicated in ROS section   Nad Ncat Neck supple no LA Rrr Ctab Abd soft, not ttp Skin well perfused.  CN2-12 wnl.  S/S grossly wnl.

## 2023-08-12 NOTE — Patient Instructions (Addendum)
Go to the lab on the way out.   If you have mychart we'll likely use that to update you.    Take care.  Glad to see you. Let Dr. Sherryll Burger know if you don't get a call soon about the referral.

## 2023-08-15 DIAGNOSIS — D649 Anemia, unspecified: Secondary | ICD-10-CM | POA: Insufficient documentation

## 2023-08-15 DIAGNOSIS — H532 Diplopia: Secondary | ICD-10-CM | POA: Insufficient documentation

## 2023-08-15 NOTE — Assessment & Plan Note (Signed)
History of.  See notes on labs.  Continue B12 and iron in the meantime.

## 2023-08-15 NOTE — Assessment & Plan Note (Signed)
Continue B12 replacement as is.

## 2023-08-15 NOTE — Assessment & Plan Note (Signed)
History of.  See above.  Discussed deferring repeat colonoscopy unless the patient had clearly worsening symptoms or significant anemia.  See notes on repeat CBC/labs.

## 2023-08-15 NOTE — Assessment & Plan Note (Signed)
He has normal cranial nerve exam at the office visit today.  He has no symptoms except with rolling over in bed.  It is temporary, recurrent, and clearly reproducible by the patient.  I do not suspect an ominous diagnosis and he was previously evaluated by neurology.  He is going to follow-up with vestibular rehab.  See after visit summary.

## 2023-09-22 ENCOUNTER — Ambulatory Visit (INDEPENDENT_AMBULATORY_CARE_PROVIDER_SITE_OTHER): Payer: Medicare Other

## 2023-09-22 DIAGNOSIS — E538 Deficiency of other specified B group vitamins: Secondary | ICD-10-CM

## 2023-09-22 MED ORDER — CYANOCOBALAMIN 1000 MCG/ML IJ SOLN
1000.0000 ug | Freq: Once | INTRAMUSCULAR | Status: AC
Start: 2023-09-22 — End: 2023-09-22
  Administered 2023-09-22: 1000 ug via INTRAMUSCULAR

## 2023-09-22 NOTE — Progress Notes (Signed)
Per orders of Dr. Crawford Givens who is out of office and Dr Eustaquio Boyden who is in office, injection of vitamin b 12 given by Lewanda Rife in left deltoid. Patient tolerated injection well. Patient will make appointment for 1 month.

## 2023-09-28 ENCOUNTER — Other Ambulatory Visit: Payer: Self-pay | Admitting: Family Medicine

## 2023-10-26 ENCOUNTER — Ambulatory Visit (INDEPENDENT_AMBULATORY_CARE_PROVIDER_SITE_OTHER): Payer: Medicare Other

## 2023-10-26 DIAGNOSIS — E538 Deficiency of other specified B group vitamins: Secondary | ICD-10-CM

## 2023-10-26 MED ORDER — CYANOCOBALAMIN 1000 MCG/ML IJ SOLN
1000.0000 ug | Freq: Once | INTRAMUSCULAR | Status: AC
  Administered 2023-10-26: 1000 ug via INTRAMUSCULAR

## 2023-10-26 NOTE — Progress Notes (Signed)
 Per orders of Dr. Crawford Givens, injection of vitamin b 12 given by Lewanda Rife in right deltoid. Patient tolerated injection well. Patient will make appointment for 1 month.

## 2023-11-25 ENCOUNTER — Ambulatory Visit: Payer: Medicare Other

## 2023-12-01 ENCOUNTER — Ambulatory Visit (INDEPENDENT_AMBULATORY_CARE_PROVIDER_SITE_OTHER): Payer: Medicare Other

## 2023-12-01 DIAGNOSIS — E538 Deficiency of other specified B group vitamins: Secondary | ICD-10-CM

## 2023-12-01 MED ORDER — CYANOCOBALAMIN 1000 MCG/ML IJ SOLN
1000.0000 ug | Freq: Once | INTRAMUSCULAR | Status: AC
  Administered 2023-12-01: 1000 ug via INTRAMUSCULAR

## 2023-12-01 NOTE — Progress Notes (Signed)
 Per orders of Mayra Reel, DPN AGNP-C, injection of B-12 given by Leonor Liv in left deltoid. Patient tolerated injection well. Patient will make appointment for 1 month.

## 2023-12-28 ENCOUNTER — Ambulatory Visit
Admission: RE | Admit: 2023-12-28 | Discharge: 2023-12-28 | Disposition: A | Discharge status: Home / Self Care | Source: Ambulatory Visit | Attending: Family Medicine | Admitting: Family Medicine

## 2023-12-28 ENCOUNTER — Ambulatory Visit (INDEPENDENT_AMBULATORY_CARE_PROVIDER_SITE_OTHER): Payer: Medicare Other

## 2023-12-28 ENCOUNTER — Ambulatory Visit (INDEPENDENT_AMBULATORY_CARE_PROVIDER_SITE_OTHER): Admitting: Family Medicine

## 2023-12-28 ENCOUNTER — Encounter: Payer: Self-pay | Admitting: Family Medicine

## 2023-12-28 ENCOUNTER — Other Ambulatory Visit: Payer: Self-pay | Admitting: Family Medicine

## 2023-12-28 VITALS — BP 128/84 | HR 68 | Temp 98.3°F | Ht 68.0 in | Wt 161.0 lb

## 2023-12-28 DIAGNOSIS — G44309 Post-traumatic headache, unspecified, not intractable: Secondary | ICD-10-CM | POA: Diagnosis not present

## 2023-12-28 DIAGNOSIS — S0990XA Unspecified injury of head, initial encounter: Secondary | ICD-10-CM | POA: Insufficient documentation

## 2023-12-28 DIAGNOSIS — I6782 Cerebral ischemia: Secondary | ICD-10-CM | POA: Diagnosis not present

## 2023-12-28 DIAGNOSIS — E538 Deficiency of other specified B group vitamins: Secondary | ICD-10-CM | POA: Diagnosis not present

## 2023-12-28 MED ORDER — CYANOCOBALAMIN 1000 MCG/ML IJ SOLN
1000.0000 ug | Freq: Once | INTRAMUSCULAR | Status: AC
  Administered 2023-12-28: 1000 ug via INTRAMUSCULAR

## 2023-12-28 NOTE — Progress Notes (Signed)
 Per orders of Dr. Crawford Givens, injection of vitamin b 12 given by Lewanda Rife in right deltoid. Patient tolerated injection well. Patient will make appointment for 1 month.

## 2023-12-28 NOTE — Progress Notes (Signed)
 Patient ID: Justin Ford, male    DOB: December 01, 1929, 89 y.o.   MRN: [id number]  This visit was conducted in person.  BP 128/84 (BP Location: Left Arm, Patient Position: Sitting, Cuff Size: Large)   Pulse 68   Temp 98.3 F (36.8 C) (Temporal)   Ht 5\' 8"  (1.727 m)   Wt 161 lb (73 kg)   SpO2 98%   BMI 24.48 kg/m    CC:  Chief Complaint  Patient presents with   Head Injury    Rolled out of bed last night and hit head and left arm    Subjective:   HPI: Justin Ford is a 89 y.o. male patient of Dr. Para March with history of hypertension, chronic kidney disease, TIA, seizure disorder, major depressive disorder presenting on 12/28/2023 for Head Injury (Rolled out of bed last night and hit head and left arm)  Last night while sleeping he rolled out of bed and hit his head and his left arm.  Hit head with forehead.  NO LOC.  Got back in bed and went back to sleep.   Bruise on left  forehead.. mild soreness.  No headache.  No confusion, no numbness, no weakness. Daughter says. He had a little trpouble filling out form today.  No speech changes.  No gait changes.  No arm pain.      He is currently on aspirin 81 mg daily for anticoagulation.  Relevant past medical, surgical, family and social history reviewed and updated as indicated. Interim medical history since our last visit reviewed. Allergies and medications reviewed and updated. Outpatient Medications Prior to Visit  Medication Sig Dispense Refill   amLODipine (NORVASC) 5 MG tablet TAKE 1 TABLET BY MOUTH DAILY 90 tablet 3   aspirin EC 81 MG tablet Take 81 mg by mouth daily. Swallow whole.     cyanocobalamin (VITAMIN B12) 1000 MCG/ML injection Inject 1 mL (1,000 mcg total) into the muscle every 30 (thirty) days.     Insulin Syringe-Needle U-100 (B-D INSULIN SYRINGE 1CC/25GX1") 25G X 1" 1 ML MISC Use monthly for B12 injection 10 each 1   Iron, Ferrous Sulfate, 325 (65 Fe) MG TABS Take 325 mg by mouth daily.      levETIRAcetam (KEPPRA) 500 MG tablet Take 500 mg by mouth 2 (two) times daily.     pantoprazole (PROTONIX) 40 MG tablet TAKE 1 TABLET BY MOUTH DAILY 90 tablet 3   rosuvastatin (CRESTOR) 20 MG tablet TAKE 1 TABLET BY MOUTH DAILY 90 tablet 3   sertraline (ZOLOFT) 50 MG tablet TAKE 1 TABLET BY MOUTH DAILY 90 tablet 3   No facility-administered medications prior to visit.     Per HPI unless specifically indicated in ROS section below Review of Systems  Constitutional:  Negative for fatigue and fever.  HENT:  Negative for ear pain.   Eyes:  Negative for pain.  Respiratory:  Negative for cough and shortness of breath.   Cardiovascular:  Negative for chest pain, palpitations and leg swelling.  Gastrointestinal:  Negative for abdominal pain.  Genitourinary:  Negative for dysuria.  Musculoskeletal:  Negative for arthralgias.  Neurological:  Negative for syncope, light-headedness and headaches.  Psychiatric/Behavioral:  Negative for dysphoric mood.    Objective:  BP 128/84 (BP Location: Left Arm, Patient Position: Sitting, Cuff Size: Large)   Pulse 68   Temp 98.3 F (36.8 C) (Temporal)   Ht 5\' 8"  (1.727 m)   Wt 161 lb (73 kg)   SpO2 98%  BMI 24.48 kg/m   Wt Readings from Last 3 Encounters:  12/28/23 161 lb (73 kg)  08/12/23 157 lb 3.2 oz (71.3 kg)  02/01/23 158 lb (71.7 kg)      Physical Exam Constitutional:      Appearance: He is well-developed.  HENT:     Head: Normocephalic.     Right Ear: Hearing normal.     Left Ear: Hearing normal.     Nose: Nose normal.  Eyes:     Extraocular Movements: Extraocular movements intact.     Right eye: Normal extraocular motion.     Left eye: Normal extraocular motion.     Conjunctiva/sclera: Conjunctivae normal.     Comments:  No papilledema  Neck:     Thyroid: No thyroid mass or thyromegaly.     Vascular: No carotid bruit.     Trachea: Trachea normal.  Cardiovascular:     Rate and Rhythm: Normal rate and regular rhythm.     Pulses:  Normal pulses.     Heart sounds: Heart sounds not distant. No murmur heard.    No friction rub. No gallop.     Comments: No peripheral edema Pulmonary:     Effort: Pulmonary effort is normal. No respiratory distress.     Breath sounds: Normal breath sounds.  Skin:    General: Skin is warm and dry.     Findings: No rash.  Neurological:     General: No focal deficit present.     Mental Status: He is oriented to person, place, and time.     Cranial Nerves: Cranial nerves 2-12 are intact.     Sensory: Sensation is intact.     Motor: Motor function is intact.     Coordination: Coordination is intact. Coordination normal. Finger-Nose-Finger Test normal.     Gait: Gait abnormal.     Comments:  Using walker, gait at baseline  Psychiatric:        Speech: Speech normal.        Behavior: Behavior normal.        Thought Content: Thought content normal.       Results for orders placed or performed in visit on 08/12/23  Ferritin   Collection Time: 08/12/23  8:57 AM  Result Value Ref Range   Ferritin 72.8 22.0 - 322.0 ng/mL  Iron   Collection Time: 08/12/23  8:57 AM  Result Value Ref Range   Iron 95 42 - 165 ug/dL  Basic metabolic panel   Collection Time: 08/12/23  8:57 AM  Result Value Ref Range   Sodium 139 135 - 145 mEq/L   Potassium 4.4 3.5 - 5.1 mEq/L   Chloride 106 96 - 112 mEq/L   CO2 26 19 - 32 mEq/L   Glucose, Bld 93 70 - 99 mg/dL   BUN 16 6 - 23 mg/dL   Creatinine, Ser 0.27 (H) 0.40 - 1.50 mg/dL   GFR 25.36 (L) >64.40 mL/min   Calcium 9.6 8.4 - 10.5 mg/dL  CBC with Differential/Platelet   Collection Time: 08/12/23  8:57 AM  Result Value Ref Range   WBC 4.7 4.0 - 10.5 K/uL   RBC 4.87 4.22 - 5.81 Mil/uL   Hemoglobin 14.6 13.0 - 17.0 g/dL   HCT 34.7 42.5 - 95.6 %   MCV 91.2 78.0 - 100.0 fl   MCHC 32.9 30.0 - 36.0 g/dL   RDW 38.7 56.4 - 33.2 %   Platelets 147.0 (L) 150.0 - 400.0 K/uL   Neutrophils Relative % 65.2  43.0 - 77.0 %   Lymphocytes Relative 26.2 12.0 - 46.0  %   Monocytes Relative 5.8 3.0 - 12.0 %   Eosinophils Relative 2.2 0.0 - 5.0 %   Basophils Relative 0.6 0.0 - 3.0 %   Neutro Abs 3.1 1.4 - 7.7 K/uL   Lymphs Abs 1.2 0.7 - 4.0 K/uL   Monocytes Absolute 0.3 0.1 - 1.0 K/uL   Eosinophils Absolute 0.1 0.0 - 0.7 K/uL   Basophils Absolute 0.0 0.0 - 0.1 K/uL    Assessment and Plan  Traumatic injury of head, initial encounter Assessment & Plan: Acute, blunt head trauma in 89 year pold patient on ASA. Normal neurologic exam in office. Daughter states some mild confusion beyond baseline.  Given increased risk for intracranial hemorrhage will send for stat head CT to rule this out. Precautions reviewed in detail for subacute hemorrhage.  They will look out for neurologic change, headache/nausea progressive over the next 2 weeks.    Orders: -     CT HEAD WO CONTRAST ( ); Future    No follow-ups on file.   Kerby Nora, MD

## 2023-12-28 NOTE — Assessment & Plan Note (Signed)
 Acute, blunt head trauma in 89 year pold patient on ASA. Normal neurologic exam in office. Daughter states some mild confusion beyond baseline.  Given increased risk for intracranial hemorrhage will send for stat head CT to rule this out. Precautions reviewed in detail for subacute hemorrhage.  They will look out for neurologic change, headache/nausea progressive over the next 2 weeks.

## 2024-01-27 ENCOUNTER — Ambulatory Visit: Payer: Medicare Other

## 2024-01-27 DIAGNOSIS — E538 Deficiency of other specified B group vitamins: Secondary | ICD-10-CM

## 2024-01-27 MED ORDER — CYANOCOBALAMIN 1000 MCG/ML IJ SOLN
1000.0000 ug | Freq: Once | INTRAMUSCULAR | Status: AC
  Administered 2024-01-27: 1000 ug via INTRAMUSCULAR

## 2024-01-27 NOTE — Progress Notes (Signed)
Per orders of Dr. Elsie Stain, injection of B-12 given by Francella Solian in left deltoid. Patient tolerated injection well. Patient will make appointment for 1 month.

## 2024-03-01 ENCOUNTER — Other Ambulatory Visit: Payer: Self-pay | Admitting: Family Medicine

## 2024-03-01 DIAGNOSIS — E785 Hyperlipidemia, unspecified: Secondary | ICD-10-CM

## 2024-03-01 DIAGNOSIS — D649 Anemia, unspecified: Secondary | ICD-10-CM

## 2024-03-01 DIAGNOSIS — I10 Essential (primary) hypertension: Secondary | ICD-10-CM

## 2024-03-01 DIAGNOSIS — E538 Deficiency of other specified B group vitamins: Secondary | ICD-10-CM

## 2024-03-01 DIAGNOSIS — M109 Gout, unspecified: Secondary | ICD-10-CM

## 2024-03-07 ENCOUNTER — Ambulatory Visit

## 2024-03-07 ENCOUNTER — Other Ambulatory Visit (INDEPENDENT_AMBULATORY_CARE_PROVIDER_SITE_OTHER)

## 2024-03-07 DIAGNOSIS — E785 Hyperlipidemia, unspecified: Secondary | ICD-10-CM | POA: Diagnosis not present

## 2024-03-07 DIAGNOSIS — I10 Essential (primary) hypertension: Secondary | ICD-10-CM | POA: Diagnosis not present

## 2024-03-07 DIAGNOSIS — M109 Gout, unspecified: Secondary | ICD-10-CM

## 2024-03-07 DIAGNOSIS — E538 Deficiency of other specified B group vitamins: Secondary | ICD-10-CM | POA: Diagnosis not present

## 2024-03-07 DIAGNOSIS — D649 Anemia, unspecified: Secondary | ICD-10-CM | POA: Diagnosis not present

## 2024-03-07 MED ORDER — CYANOCOBALAMIN 1000 MCG/ML IJ SOLN
1000.0000 ug | Freq: Once | INTRAMUSCULAR | Status: AC
  Administered 2024-03-07: 1000 ug via INTRAMUSCULAR

## 2024-03-07 NOTE — Progress Notes (Signed)
 Per orders of Dr. Crawford Givens, injection of B-12 given by Nanci Pina in right deltoid. Patient tolerated injection well.

## 2024-03-08 ENCOUNTER — Ambulatory Visit: Payer: Self-pay | Admitting: Family Medicine

## 2024-03-08 LAB — COMPREHENSIVE METABOLIC PANEL WITH GFR
ALT: 18 U/L (ref 0–53)
AST: 16 U/L (ref 0–37)
Albumin: 4.3 g/dL (ref 3.5–5.2)
Alkaline Phosphatase: 110 U/L (ref 39–117)
BUN: 18 mg/dL (ref 6–23)
CO2: 28 meq/L (ref 19–32)
Calcium: 9.2 mg/dL (ref 8.4–10.5)
Chloride: 106 meq/L (ref 96–112)
Creatinine, Ser: 1.68 mg/dL — ABNORMAL HIGH (ref 0.40–1.50)
GFR: 35.27 mL/min — ABNORMAL LOW (ref >60.00)
Glucose, Bld: 70 mg/dL (ref 70–99)
Potassium: 4.4 meq/L (ref 3.5–5.1)
Sodium: 140 meq/L (ref 135–145)
Total Bilirubin: 0.5 mg/dL (ref 0.2–1.2)
Total Protein: 7.7 g/dL (ref 6.0–8.3)

## 2024-03-08 LAB — CBC WITH DIFFERENTIAL/PLATELET
Basophils Absolute: 0.1 10*3/uL (ref 0.0–0.1)
Basophils Relative: 1.2 % (ref 0.0–3.0)
Eosinophils Absolute: 0.1 10*3/uL (ref 0.0–0.7)
Eosinophils Relative: 2.4 % (ref 0.0–5.0)
HCT: 41.5 % (ref 39.0–52.0)
Hemoglobin: 14.1 g/dL (ref 13.0–17.0)
Lymphocytes Relative: 25.7 % (ref 12.0–46.0)
Lymphs Abs: 1.5 10*3/uL (ref 0.7–4.0)
MCHC: 34 g/dL (ref 30.0–36.0)
MCV: 88.7 fl (ref 78.0–100.0)
Monocytes Absolute: 0.4 10*3/uL (ref 0.1–1.0)
Monocytes Relative: 7.2 % (ref 3.0–12.0)
Neutro Abs: 3.8 10*3/uL (ref 1.4–7.7)
Neutrophils Relative %: 63.5 % (ref 43.0–77.0)
Platelets: 147 10*3/uL — ABNORMAL LOW (ref 150.0–400.0)
RBC: 4.68 Mil/uL (ref 4.22–5.81)
RDW: 13.9 % (ref 11.5–15.5)
WBC: 6 10*3/uL (ref 4.0–10.5)

## 2024-03-08 LAB — LIPID PANEL
Cholesterol: 120 mg/dL (ref 0–200)
HDL: 48 mg/dL (ref >39.00)
LDL Cholesterol: 34 mg/dL (ref 0–99)
NonHDL: 71.57
Total CHOL/HDL Ratio: 2
Triglycerides: 187 mg/dL — ABNORMAL HIGH (ref 0.0–149.0)
VLDL: 37.4 mg/dL (ref 0.0–40.0)

## 2024-03-08 LAB — URIC ACID: Uric Acid, Serum: 5 mg/dL (ref 4.0–7.8)

## 2024-03-08 LAB — FERRITIN: Ferritin: 76.9 ng/mL (ref 22.0–322.0)

## 2024-03-08 LAB — TSH: TSH: 2.39 u[IU]/mL (ref 0.35–5.50)

## 2024-03-08 LAB — VITAMIN B12: Vitamin B-12: 559 pg/mL (ref 211–911)

## 2024-03-08 LAB — IRON: Iron: 66 ug/dL (ref 42–165)

## 2024-03-16 ENCOUNTER — Ambulatory Visit

## 2024-03-22 DIAGNOSIS — D2261 Melanocytic nevi of right upper limb, including shoulder: Secondary | ICD-10-CM | POA: Diagnosis not present

## 2024-03-22 DIAGNOSIS — D2272 Melanocytic nevi of left lower limb, including hip: Secondary | ICD-10-CM | POA: Diagnosis not present

## 2024-03-22 DIAGNOSIS — L821 Other seborrheic keratosis: Secondary | ICD-10-CM | POA: Diagnosis not present

## 2024-03-22 DIAGNOSIS — D225 Melanocytic nevi of trunk: Secondary | ICD-10-CM | POA: Diagnosis not present

## 2024-03-22 DIAGNOSIS — D2262 Melanocytic nevi of left upper limb, including shoulder: Secondary | ICD-10-CM | POA: Diagnosis not present

## 2024-03-22 DIAGNOSIS — Z85828 Personal history of other malignant neoplasm of skin: Secondary | ICD-10-CM | POA: Diagnosis not present

## 2024-03-27 DIAGNOSIS — Z961 Presence of intraocular lens: Secondary | ICD-10-CM | POA: Diagnosis not present

## 2024-03-31 ENCOUNTER — Encounter: Admitting: Family Medicine

## 2024-04-06 ENCOUNTER — Ambulatory Visit

## 2024-04-06 ENCOUNTER — Encounter: Payer: Self-pay | Admitting: Family Medicine

## 2024-04-06 ENCOUNTER — Ambulatory Visit (INDEPENDENT_AMBULATORY_CARE_PROVIDER_SITE_OTHER): Admitting: Family Medicine

## 2024-04-06 VITALS — BP 132/80 | HR 64 | Temp 98.0°F | Ht 66.5 in | Wt 161.1 lb

## 2024-04-06 DIAGNOSIS — G40909 Epilepsy, unspecified, not intractable, without status epilepticus: Secondary | ICD-10-CM

## 2024-04-06 DIAGNOSIS — R251 Tremor, unspecified: Secondary | ICD-10-CM

## 2024-04-06 DIAGNOSIS — E785 Hyperlipidemia, unspecified: Secondary | ICD-10-CM | POA: Diagnosis not present

## 2024-04-06 DIAGNOSIS — E538 Deficiency of other specified B group vitamins: Secondary | ICD-10-CM | POA: Diagnosis not present

## 2024-04-06 DIAGNOSIS — I10 Essential (primary) hypertension: Secondary | ICD-10-CM | POA: Diagnosis not present

## 2024-04-06 DIAGNOSIS — D649 Anemia, unspecified: Secondary | ICD-10-CM | POA: Diagnosis not present

## 2024-04-06 DIAGNOSIS — K2270 Barrett's esophagus without dysplasia: Secondary | ICD-10-CM | POA: Diagnosis not present

## 2024-04-06 DIAGNOSIS — F32A Depression, unspecified: Secondary | ICD-10-CM

## 2024-04-06 DIAGNOSIS — R296 Repeated falls: Secondary | ICD-10-CM

## 2024-04-06 DIAGNOSIS — Z0000 Encounter for general adult medical examination without abnormal findings: Secondary | ICD-10-CM

## 2024-04-06 DIAGNOSIS — Z789 Other specified health status: Secondary | ICD-10-CM

## 2024-04-06 MED ORDER — CYANOCOBALAMIN 1000 MCG/ML IJ SOLN
1000.0000 ug | Freq: Once | INTRAMUSCULAR | Status: AC
  Administered 2024-04-06: 1000 ug via INTRAMUSCULAR

## 2024-04-06 NOTE — Patient Instructions (Addendum)
 Let me know if you don't get a call about PT.   I would hold crestor  for 2 weeks and see if the aches get better, if the mattress change doesn't help.  Either way, let me know.    Tylenol - max 2 tabs per dose, up to 3 times per day.    Keep going with B12 and iron  as is.   Take care.  Glad to see you.

## 2024-04-06 NOTE — Progress Notes (Signed)
 Per orders of Dr. Crawford Givens, injection of vitamin b 12 given by Lewanda Rife in left deltoid. Patient tolerated injection well. Patient will make appointment for 1 month.

## 2024-04-06 NOTE — Progress Notes (Signed)
 Flu 2024 Shingles previously done PNA previously done Tetanus 2013 RSV prev done.   COVID-vaccine previously done Colon cancer screening not due given his age. Prostate cancer screening not due given his age. Advance directive-daughter designated if patient were incapacitated.   B hand tremor.  Episodic.  It may have increased some. Affected his handwriting.  No other new neurologic symptoms.  Has fallen x3 over the last few months (twice falling out of bed).  He got new shoes with better tread in the meantime.  Using rollator.  He wasn't lightheaded.  Abrasion on the R olecranon is healing.  Offered PT, encouraged.     Mood is variable, overall better on sertraline  and discussed continuing as is.     H/o anemia. B12 def on replacement.  Still on iron .  Black stools but not constipated.     Hypertension:               Using medication without problems or lightheadedness: yes Chest pain with exertion: no Edema:no Short of breath:no Cr stable.     History of seizure disorder.  He isn't driving.  Still on keppra .  Compliant.  Had neuro eval prev.     Elevated Cholesterol: Using medications without problems: yes Muscle aches: some aches in the legs.  Tolerable.  Tylenol  helps.  From the B knee to ankles.  He is going to try a new mattress topper to see if that helps.   Diet compliance: d/w pt.   Exercise: d/w pt.    H/o Barretts on PPI at baseline.  Swallowing well.    PMH and SH reviewed  Meds, vitals, and allergies reviewed.   ROS: Per HPI unless specifically indicated in ROS section   GEN: nad, alert and pleasant in conversation HEENT: mucous membranes moist NECK: supple w/o LA CV: rrr. PULM: ctab, no inc wob ABD: soft, +bs EXT: no edema SKIN: Well-perfused.  30 minutes were devoted to patient care in this encounter (this includes time spent reviewing the patient's file/history, interviewing and examining the patient, counseling/reviewing plan with patient).

## 2024-04-09 DIAGNOSIS — R296 Repeated falls: Secondary | ICD-10-CM | POA: Insufficient documentation

## 2024-04-09 DIAGNOSIS — Z0000 Encounter for general adult medical examination without abnormal findings: Secondary | ICD-10-CM | POA: Insufficient documentation

## 2024-04-09 NOTE — Assessment & Plan Note (Signed)
Continue sertraline as is.

## 2024-04-09 NOTE — Assessment & Plan Note (Signed)
Advance directive- daughter designated if patient were incapacitated.   

## 2024-04-09 NOTE — Assessment & Plan Note (Signed)
 Continue Protonix  as is.  Swallowing well.

## 2024-04-09 NOTE — Assessment & Plan Note (Signed)
 Unclear if Crestor  is contributing to aches. I would hold crestor  for 2 weeks and see if the aches get better, if the mattress change doesn't help.  Either way, let me know.  Discussed.  Patient and daughter can update me.

## 2024-04-09 NOTE — Assessment & Plan Note (Signed)
 Flu 2024 Shingles previously done PNA previously done Tetanus 2013 RSV prev done.   COVID-vaccine previously done Colon cancer screening not due given his age. Prostate cancer screening not due given his age. Advance directive-daughter designated if patient were incapacitated.

## 2024-04-09 NOTE — Assessment & Plan Note (Signed)
 Discussed referral for PT.  Patient/daughter can let me know if he has trouble getting set up.  Safety discussed.

## 2024-04-09 NOTE — Assessment & Plan Note (Signed)
 History of anemia.  Would continue B12 and iron  as is.  Labs discussed with patient.

## 2024-04-09 NOTE — Assessment & Plan Note (Signed)
 Not bothersome enough to treat or take extra medicine at this point, per patient report.  He can update me as needed.

## 2024-04-09 NOTE — Assessment & Plan Note (Signed)
Continue amlodipine.  Labs discussed with patient.

## 2024-04-09 NOTE — Assessment & Plan Note (Signed)
 Not driving.  Continue Keppra .  No recent events.

## 2024-05-09 ENCOUNTER — Ambulatory Visit (INDEPENDENT_AMBULATORY_CARE_PROVIDER_SITE_OTHER)

## 2024-05-09 DIAGNOSIS — E538 Deficiency of other specified B group vitamins: Secondary | ICD-10-CM | POA: Diagnosis not present

## 2024-05-09 MED ORDER — CYANOCOBALAMIN 1000 MCG/ML IJ SOLN
1000.0000 ug | Freq: Once | INTRAMUSCULAR | Status: AC
  Administered 2024-05-09: 1000 ug via INTRAMUSCULAR

## 2024-05-09 NOTE — Progress Notes (Signed)
 Per orders of Dr. Crawford Givens, injection of vitamin b 12 given by Lewanda Rife in right deltoid. Patient tolerated injection well. Patient will make appointment for 1 month.

## 2024-05-12 ENCOUNTER — Ambulatory Visit

## 2024-05-12 VITALS — Ht 66.5 in | Wt 161.0 lb

## 2024-05-12 DIAGNOSIS — Z0000 Encounter for general adult medical examination without abnormal findings: Secondary | ICD-10-CM

## 2024-05-12 NOTE — Patient Instructions (Signed)
 Justin Ford , Thank you for taking time out of your busy schedule to complete your Annual Wellness Visit with me. I enjoyed our conversation and look forward to speaking with you again next year. I, as well as your care team,  appreciate your ongoing commitment to your health goals. Please review the following plan we discussed and let me know if I can assist you in the future. Your Game plan/ To Do List    Referrals: If you haven't heard from the office you've been referred to, please reach out to them at the phone provided.   Follow up Visits: We will see or speak with you next year for your Next Medicare AWV with our clinical staff-05/15/25 @ 3pm televisit Have you seen your provider in the last 6 months (3 months if uncontrolled diabetes)? Yes  Clinician Recommendations:  Aim for 30 minutes of exercise or brisk walking, 6-8 glasses of water, and 5 servings of fruits and vegetables each day.       This is a list of the screenings recommended for you:  Health Maintenance  Topic Date Due   DTaP/Tdap/Td vaccine (3 - Td or Tdap) 03/15/2022   Colon Cancer Screening  04/19/2023   COVID-19 Vaccine (6 - 2024-25 season) 01/15/2024   Flu Shot  05/05/2024   Medicare Annual Wellness Visit  05/12/2025   Pneumococcal Vaccine for age over 60  Completed   Zoster (Shingles) Vaccine  Completed   Hepatitis B Vaccine  Aged Out   HPV Vaccine  Aged Out   Meningitis B Vaccine  Aged Out    Advanced directives: (Copy Requested) Please bring a copy of your health care power of attorney and living will to the office to be added to your chart at your convenience. You can mail to [facility] [address] [place], [place] [address] or email to [email or web address] Advance Care Planning is important because it:  [x]  Makes sure you receive the medical care that is consistent with your values, goals, and preferences  [x]  It provides guidance to your family and loved ones and reduces their  decisional burden about whether or not they are making the right decisions based on your wishes.  Follow the link provided in your after visit summary or read over the paperwork we have mailed to you to help you started getting your Advance Directives in place. If you need assistance in completing these, please reach out to us  so that we can help you!

## 2024-05-12 NOTE — Progress Notes (Addendum)
 Subjective:   Justin Ford is a 89 y.o. who presents for a Medicare Wellness preventive visit.  As a reminder, Annual Wellness Visits don't include a physical exam, and some assessments may be limited, especially if this visit is performed virtually. We may recommend an in-person follow-up visit with your provider if needed.  Visit Complete: Virtual I connected with  [name] on 05/12/24 by a audio enabled telemedicine application and verified that I am speaking with the correct person using two identifiers.  Patient Location: Home  Provider Location: Office/Clinic  I discussed the limitations of evaluation and management by telemedicine. The patient expressed understanding and agreed to proceed.  Vital Signs: Because this visit was a virtual/telehealth visit, some criteria may be missing or patient reported. Any vitals not documented were not able to be obtained and vitals that have been documented are patient reported.  VideoDeclined- This patient declined Librarian, academic. Therefore the visit was completed with audio only.  Persons Participating in Visit: Patient. And daughter Hollice  AWV Questionnaire: No: Patient Medicare AWV questionnaire was not completed prior to this visit.  Cardiac Risk Factors include: advanced age (>57men, >91 women);dyslipidemia;male gender;hypertension;sedentary lifestyle     Objective:    Today's Vitals   05/12/24 1520  Weight: 161 lb (73 kg)  Height: 5' 6.5 (1.689 m)   Body mass index is 25.6 kg/m.     05/12/2024    3:27 PM 11/10/2022    7:55 AM 11/22/2020    1:14 PM 11/02/2020   10:41 PM 11/02/2020    3:33 PM 07/31/2020   10:40 AM 07/27/2020    4:29 PM  Advanced Directives  Does Patient Have a Medical Advance Directive? Yes Yes Yes Yes Yes Yes   Type of Estate agent of [place] at [place];Living will Healthcare Power of [place];Living will Healthcare Power of [employer] Power of  [employer] Power of Attorney Living will;Healthcare Power of Attorney   Does patient want to make changes to medical advance directive?    No - Patient declined     Copy of Healthcare Power of Attorney in Chart? No - copy requested No - copy requested  No - copy requested  No - copy requested No - copy requested  Would patient like information on creating a medical advance directive?       No - Patient declined    Current Medications (verified) Outpatient Encounter Medications as of 05/12/2024  Medication Sig   amLODipine  (NORVASC ) 5 MG tablet TAKE 1 TABLET BY MOUTH DAILY   aspirin  EC 81 MG tablet Take 81 mg by mouth daily. Swallow whole.   cyanocobalamin  (VITAMIN B12) 1000 MCG/ML injection Inject 1 mL (1,000 mcg total) into the muscle every 30 (thirty) days.   Insulin  Syringe-Needle U-100 (B-D INSULIN  SYRINGE 1CC/25GX1) 25G X 1 1 ML MISC Use monthly for B12 injection   Iron , Ferrous Sulfate , 325 (65 Fe) MG TABS Take 325 mg by mouth daily.   levETIRAcetam  (KEPPRA ) 500 MG tablet Take 500 mg by mouth 2 (two) times daily.   pantoprazole  (PROTONIX ) 40 MG tablet TAKE 1 TABLET BY MOUTH DAILY   rosuvastatin  (CRESTOR ) 20 MG tablet TAKE 1 TABLET BY MOUTH DAILY   sertraline  (ZOLOFT ) 50 MG tablet TAKE 1 TABLET BY MOUTH DAILY   No facility-administered encounter medications on file as of 05/12/2024.    Allergies (verified) Lisinopril  and Lipitor [atorvastatin]   History: Past Medical History:  Diagnosis Date   Barrett's esophagus  Blood in stool    colonoscopy done ~2010   Cancer (HCC)    basal cell CA per derm   GERD (gastroesophageal reflux disease)    Hyperlipidemia    Hypertension    Kidney stones    TIA (transient ischemic attack)    2016   Past Surgical History:  Procedure Laterality Date   COLONOSCOPY WITH PROPOFOL  N/A 04/18/2020   Procedure: COLONOSCOPY WITH PROPOFOL ;  Surgeon: Janalyn Keene NOVAK, MD;  Location: [facility] ENDOSCOPY;  Service: Endoscopy;  Laterality: N/A;    ESOPHAGOGASTRODUODENOSCOPY (EGD) WITH PROPOFOL  N/A 03/07/2020   Procedure: ESOPHAGOGASTRODUODENOSCOPY (EGD) WITH PROPOFOL ;  Surgeon: Janalyn Keene NOVAK, MD;  Location: [facility] ENDOSCOPY;  Service: Endoscopy;  Laterality: N/A;   EYE SURGERY     bilateral cataract   INTRAMEDULLARY (IM) NAIL INTERTROCHANTERIC Right 11/02/2020   Procedure: INTRAMEDULLARY (IM) NAIL INTERTROCHANTRIC;  Surgeon: Gini Rockey BRAVO, MD;  Location: [facility] ORS;  Service: Orthopedics;  Laterality: Right;   PROSTATE BIOPSY     neg x2 per patient   SHOULDER SURGERY     per Dr. Gerome, R shoulder   Family History  Problem Relation Age of Onset   Dementia Mother    Alcohol abuse Father    Heart disease Brother    Cancer Sister        possible breast cancer, pt was unsure   Colon cancer Neg Hx    Prostate cancer Neg Hx    Social History   Socioeconomic History   Marital status: Married    Spouse name: Not on file   Number of children: Not on file   Years of education: Not on file   Highest education level: Not on file  Occupational History   Occupation: retired    Comment: Airline pilot  Tobacco Use   Smoking status: Former    Current packs/day: 0.00    Average packs/day: 0.3 packs/day for 4.0 years (1.0 ttl pk-yrs)    Types: Cigarettes    Start date: 10/05/1946    Quit date: 10/05/1950    Years since quitting: 73.6   Smokeless tobacco: Never  Vaping Use   Vaping status: Never Used  Substance and Sexual Activity   Alcohol use: Not Currently    Alcohol/week: 2.0 standard drinks of alcohol    Types: 2 Cans of beer per week   Drug use: No   Sexual activity: Never  Other Topics Concern   Not on file  Social History Narrative   Married 1951   From [place] of [address]    Retired Airline pilot   2 kids, 2 grandkids    Social Drivers of Corporate investment banker Strain: Low Risk  (05/12/2024)   Overall Financial Resource Strain (CARDIA)    Difficulty of Paying Living Expenses: Not hard at all   Food Insecurity: No Food Insecurity (05/12/2024)   Hunger Vital Sign    Worried About Running Out of Food in the Last Year: Never true    Ran Out of Food in the Last Year: Never true  Transportation Needs: No Transportation Needs (05/12/2024)   PRAPARE - Administrator, Civil Service (Medical): No    Lack of Transportation (Non-Medical): No  Physical Activity: Inactive (05/12/2024)   Exercise Vital Sign    Days of Exercise per Week: 0 days    Minutes of Exercise per Session: 0 min  Stress: No Stress Concern Present (05/12/2024)   [employer] of Occupational Health - Occupational Stress Questionnaire  Feeling of Stress: Not at all  Social Connections: Socially Isolated (05/12/2024)   Social Connection and Isolation Panel    Frequency of Communication with Friends and Family: Never    Frequency of Social Gatherings with Friends and Family: Once a week    Attends Religious Services: Never    Database administrator or Organizations: No    Attends Engineer, structural: Never    Marital Status: Married    Tobacco Counseling Counseling given: Not Answered    Clinical Intake:  Pre-visit preparation completed: Yes  Pain : No/denies pain     BMI - recorded: 25.6 Nutritional Status: BMI 25 -29 Overweight Nutritional Risks: None Diabetes: No  Lab Results  Component Value Date   HGBA1C 5.7 (H) 07/27/2020   HGBA1C 5.7 10/09/2016   HGBA1C 5.9 08/16/2014     How often do you need to have someone help you when you read instructions, pamphlets, or other written materials from your doctor or pharmacy?: 1 - Never  Interpreter Needed?: No  Comments: lives with wife Information entered by :: B.Hiro Vipond,LPN   Activities of Daily Living     05/12/2024    3:27 PM  In your present state of health, do you have any difficulty performing the following activities:  Hearing? 1  Vision? 0  Difficulty concentrating or making decisions? 0  Walking or climbing stairs?  1  Dressing or bathing? 0  Doing errands, shopping? 1  Preparing Food and eating ? N  Using the Toilet? N  In the past six months, have you accidently leaked urine? N  Do you have problems with loss of bowel control? N  Managing your Medications? Y  Managing your Finances? Y  Housekeeping or managing your Housekeeping? Y    Patient Care Team: Cleatus Arlyss RAMAN, MD as PCP - General (Family Medicine) Maree Jannett POUR, MD (Neurology) Tat, Asberry RAMAN, DO as Consulting Physician (Neurology) Dasher, Alm LABOR, MD as Consulting Physician (Dermatology) Lenn Standing, MD as Consulting Physician (Ophthalmology) Melba Medin as Consulting Physician (Dentistry) Green, Davina E, RN as [facility] Care Management  I have updated your Care Teams any recent Medical Services you may have received from other providers in the past year.     Assessment:   This is a routine wellness examination for [facility].  Hearing/Vision screen Hearing Screening - Comments:: Pt says his hearing is not as good;wears head phones Vision Screening - Comments:: Pt says his vision is good;glasses for reading only Dr Lenn   Goals Addressed             This Visit's Progress    COMPLETED: Patient Stated       To be able to get a drink      Patient Stated       Staying alive       Depression Screen     05/12/2024    3:25 PM 04/06/2024    2:07 PM 08/12/2023    9:03 AM 02/01/2023    4:14 PM 11/10/2022    7:53 AM 10/07/2021    4:17 PM 12/13/2017   10:51 AM  PHQ 2/9 Scores  PHQ - 2 Score 0 0 0 1 0 0 0  PHQ- 9 Score  1 2 2        Fall Risk     05/12/2024    3:23 PM 04/06/2024    2:07 PM 08/12/2023    9:02 AM 02/01/2023    4:13 PM 11/10/2022  7:57 AM  Fall Risk   Falls in the past year? 1 1 0 0 0  Number falls in past yr: 1 1 0 0 0  Injury with Fall? 0 1 0 0 0  Risk for fall due to : No Fall Risks;Impaired mobility;History of fall(s)  No Fall Risks No Fall Risks Impaired vision;Impaired  balance/gait  Follow up Falls prevention discussed;Education provided  Falls evaluation completed Falls evaluation completed Falls prevention discussed    MEDICARE RISK AT HOME:  Medicare Risk at Home Any stairs in or around the home?: Yes If so, are there any without handrails?: Yes Home free of loose throw rugs in walkways, pet beds, electrical cords, etc?: Yes Adequate lighting in your home to reduce risk of falls?: Yes Life alert?: No Use of a cane, walker or w/c?: Yes Grab bars in the bathroom?: Yes Shower chair or bench in shower?: Yes Elevated toilet seat or a handicapped toilet?: Yes  TIMED UP AND GO:  Was the test performed?  No  Cognitive Function: 6CIT completed    09/15/2016   10:59 AM  MMSE - Mini Mental State Exam  Orientation to time 5   Orientation to Place 5   Registration 3   Attention/ Calculation 0   Recall 1   Recall-comments pt was unable to recall 2 of 3 words   Language- name 2 objects 0   Language- repeat 1  Language- follow 3 step command 3   Language- read & follow direction 0   Write a sentence 0   Copy design 0   Total score 18      Data saved with a previous flowsheet row definition        05/12/2024    3:28 PM 11/10/2022    7:59 AM  6CIT Screen  What Year? 0 points 0 points  What month? 0 points 0 points  What time? 0 points 0 points  Count back from 20 0 points 0 points  Months in reverse 0 points 0 points  Repeat phrase 4 points 4 points  Total Score 4 points 4 points    Immunizations Immunization History  Administered Date(s) Administered    sv, Bivalent, Protein Subunit Rsvpref,pf [place]) 07/17/2023   DT (Pediatric) 03/15/2012   Fluad Quad(high Dose 65+) 07/17/2020   Influenza, High Dose Seasonal PF 06/14/2014, 06/08/2019, 06/08/2019, 06/28/2021, 07/04/2022, 07/17/2023   Influenza,inj,Quad PF,6+ Mos 08/08/2013, 06/27/2015, 07/14/2016, 06/30/2017, 07/12/2018   Influenza,inj,quad, With Preservative 10/05/2016    Influenza-Unspecified 07/06/2011, 07/04/2022   PFIZER Comirnaty(Gray Top)Covid-19 Tri-Sucrose Vaccine 07/04/2022   PFIZER(Purple Top)SARS-COV-2 Vaccination 11/08/2019, 11/29/2019   Pfizer Covid-19 Vaccine Bivalent Booster 47yrs & up 06/28/2021   Pfizer(Comirnaty)Fall Seasonal Vaccine 12 years and older 07/17/2023   Pneumococcal Conjugate-13 08/23/2014   Pneumococcal Polysaccharide-23 09/15/2016   Pneumococcal-Unspecified 10/05/2016   Tdap 03/15/2012   Zoster Recombinant(Shingrix) 07/24/2019, 10/19/2019   Zoster, Live 06/14/2012    Screening Tests Health Maintenance  Topic Date Due   DTaP/Tdap/Td (3 - Td or Tdap) 03/15/2022   Colonoscopy  04/19/2023   COVID-19 Vaccine (6 - 2024-25 season) 01/15/2024   INFLUENZA VACCINE  05/05/2024   Medicare Annual Wellness (AWV)  05/12/2025   Pneumococcal Vaccine: 50+ Years  Completed   Zoster Vaccines- Shingrix  Completed   Hepatitis B Vaccines  Aged Out   HPV VACCINES  Aged Out   Meningococcal B Vaccine  Aged Out    Health Maintenance  Health Maintenance Due  Topic Date Due   DTaP/Tdap/Td (3 - Td or Tdap)  03/15/2022   Colonoscopy  04/19/2023   COVID-19 Vaccine (6 - 2024-25 season) 01/15/2024   INFLUENZA VACCINE  05/05/2024   Health Maintenance Items Addressed: NONE AT THIS TIME  Additional Screening:  Vision Screening: Recommended annual ophthalmology exams for early detection of glaucoma and other disorders of the eye. Would you like a referral to an eye doctor? No    Dental Screening: Recommended annual dental exams for proper oral hygiene  Community Resource Referral / Chronic Care Management: CRR required this visit?  No   CCM required this visit?  No   Plan:    I have personally reviewed and noted the following in the patient's chart:   Medical and social history Use of alcohol, tobacco or illicit drugs  Current medications and supplements including opioid prescriptions. Patient is not currently taking opioid  prescriptions. Functional ability and status Nutritional status Physical activity Advanced directives List of other physicians Hospitalizations, surgeries, and ER visits in previous 12 months Vitals Screenings to include cognitive, depression, and falls Referrals and appointments  In addition, I have reviewed and discussed with patient certain preventive protocols, quality metrics, and best practice recommendations. A written personalized care plan for preventive services as well as general preventive health recommendations were provided to patient.   Erminio LITTIE Saris, LPN   10/05/7972   After Visit Summary: (MyChart) Due to this being a telephonic visit, the after visit summary with patients personalized plan was offered to patient via MyChart   Notes: Nothing significant to report at this time.

## 2024-05-15 ENCOUNTER — Ambulatory Visit

## 2024-05-17 ENCOUNTER — Ambulatory Visit

## 2024-05-22 ENCOUNTER — Ambulatory Visit: Admitting: Physical Therapy

## 2024-05-23 ENCOUNTER — Other Ambulatory Visit: Payer: Self-pay | Admitting: Family Medicine

## 2024-05-23 DIAGNOSIS — F32A Depression, unspecified: Secondary | ICD-10-CM

## 2024-05-24 ENCOUNTER — Ambulatory Visit: Admitting: Physical Therapy

## 2024-05-24 NOTE — Telephone Encounter (Signed)
 E-scribed refill.  Plz schedule annual exam and fasting labs (1 wk prior to annual) for additional refills.

## 2024-05-26 ENCOUNTER — Ambulatory Visit: Admitting: Physical Therapy

## 2024-05-26 NOTE — Telephone Encounter (Signed)
 lvm for pt to call office to schedule appt no voicemail

## 2024-05-29 ENCOUNTER — Ambulatory Visit

## 2024-05-31 ENCOUNTER — Ambulatory Visit: Admitting: Physical Therapy

## 2024-05-31 NOTE — Telephone Encounter (Signed)
 Lvm, note placed on next nurse visit to schedule if patient has not called back by that time.

## 2024-06-06 ENCOUNTER — Ambulatory Visit

## 2024-06-08 ENCOUNTER — Ambulatory Visit

## 2024-06-08 DIAGNOSIS — Z23 Encounter for immunization: Secondary | ICD-10-CM | POA: Diagnosis not present

## 2024-06-08 DIAGNOSIS — E538 Deficiency of other specified B group vitamins: Secondary | ICD-10-CM

## 2024-06-08 MED ORDER — CYANOCOBALAMIN 1000 MCG/ML IJ SOLN
1000.0000 ug | Freq: Once | INTRAMUSCULAR | Status: AC
  Administered 2024-06-08: 1000 ug via INTRAMUSCULAR

## 2024-06-08 NOTE — Progress Notes (Signed)
 Per orders of Dr. Arlyss Solian, injection of B-12 given by Danna CINDERELLA Hummer in left deltoid. Patient tolerated injection well. Patient will make appointment for 1 month.  Patient received high dose flu vaccine at nurse visit today.

## 2024-06-09 ENCOUNTER — Ambulatory Visit: Admitting: Physical Therapy

## 2024-06-13 ENCOUNTER — Ambulatory Visit

## 2024-06-16 ENCOUNTER — Ambulatory Visit: Admitting: Physical Therapy

## 2024-06-19 ENCOUNTER — Ambulatory Visit

## 2024-06-21 ENCOUNTER — Ambulatory Visit: Admitting: Physical Therapy

## 2024-06-26 ENCOUNTER — Ambulatory Visit: Admitting: Physical Therapy

## 2024-06-28 ENCOUNTER — Ambulatory Visit: Admitting: Physical Therapy

## 2024-07-03 ENCOUNTER — Ambulatory Visit: Admitting: Physical Therapy

## 2024-07-04 DIAGNOSIS — G40909 Epilepsy, unspecified, not intractable, without status epilepticus: Secondary | ICD-10-CM | POA: Diagnosis not present

## 2024-07-04 DIAGNOSIS — Z9181 History of falling: Secondary | ICD-10-CM | POA: Diagnosis not present

## 2024-07-04 DIAGNOSIS — G939 Disorder of brain, unspecified: Secondary | ICD-10-CM | POA: Diagnosis not present

## 2024-07-04 DIAGNOSIS — R42 Dizziness and giddiness: Secondary | ICD-10-CM | POA: Diagnosis not present

## 2024-07-04 DIAGNOSIS — Z1331 Encounter for screening for depression: Secondary | ICD-10-CM | POA: Diagnosis not present

## 2024-07-04 DIAGNOSIS — G629 Polyneuropathy, unspecified: Secondary | ICD-10-CM | POA: Diagnosis not present

## 2024-07-04 DIAGNOSIS — H9193 Unspecified hearing loss, bilateral: Secondary | ICD-10-CM | POA: Diagnosis not present

## 2024-07-05 ENCOUNTER — Ambulatory Visit: Admitting: Physical Therapy

## 2024-07-09 ENCOUNTER — Other Ambulatory Visit: Payer: Self-pay | Admitting: Family Medicine

## 2024-07-09 DIAGNOSIS — D649 Anemia, unspecified: Secondary | ICD-10-CM

## 2024-07-09 DIAGNOSIS — I10 Essential (primary) hypertension: Secondary | ICD-10-CM

## 2024-07-09 DIAGNOSIS — E538 Deficiency of other specified B group vitamins: Secondary | ICD-10-CM

## 2024-07-10 ENCOUNTER — Ambulatory Visit: Admitting: Physical Therapy

## 2024-07-11 ENCOUNTER — Ambulatory Visit

## 2024-07-11 ENCOUNTER — Other Ambulatory Visit

## 2024-07-11 DIAGNOSIS — E538 Deficiency of other specified B group vitamins: Secondary | ICD-10-CM

## 2024-07-11 MED ORDER — CYANOCOBALAMIN 1000 MCG/ML IJ SOLN
1000.0000 ug | Freq: Once | INTRAMUSCULAR | Status: AC
  Administered 2024-07-11: 1000 ug via INTRAMUSCULAR

## 2024-07-11 NOTE — Progress Notes (Signed)
 Per orders of Dr. Arlyss Solian, injection of B-12 given by Nellie Hummer in right deltoid. Patient tolerated injection well.

## 2024-07-12 ENCOUNTER — Ambulatory Visit: Admitting: Physical Therapy

## 2024-07-13 ENCOUNTER — Ambulatory Visit

## 2024-07-17 ENCOUNTER — Encounter: Payer: Self-pay | Admitting: Family Medicine

## 2024-07-17 ENCOUNTER — Ambulatory Visit: Admitting: Physical Therapy

## 2024-07-17 ENCOUNTER — Ambulatory Visit: Admitting: Family Medicine

## 2024-07-17 VITALS — BP 138/82 | HR 58 | Temp 98.2°F | Ht 66.5 in | Wt 159.0 lb

## 2024-07-17 DIAGNOSIS — E785 Hyperlipidemia, unspecified: Secondary | ICD-10-CM

## 2024-07-17 DIAGNOSIS — R296 Repeated falls: Secondary | ICD-10-CM

## 2024-07-17 DIAGNOSIS — F32A Depression, unspecified: Secondary | ICD-10-CM

## 2024-07-17 DIAGNOSIS — G40909 Epilepsy, unspecified, not intractable, without status epilepticus: Secondary | ICD-10-CM

## 2024-07-17 DIAGNOSIS — D649 Anemia, unspecified: Secondary | ICD-10-CM | POA: Diagnosis not present

## 2024-07-17 DIAGNOSIS — Z0000 Encounter for general adult medical examination without abnormal findings: Secondary | ICD-10-CM

## 2024-07-17 DIAGNOSIS — Z789 Other specified health status: Secondary | ICD-10-CM | POA: Diagnosis not present

## 2024-07-17 DIAGNOSIS — E538 Deficiency of other specified B group vitamins: Secondary | ICD-10-CM

## 2024-07-17 DIAGNOSIS — K2270 Barrett's esophagus without dysplasia: Secondary | ICD-10-CM

## 2024-07-17 DIAGNOSIS — R251 Tremor, unspecified: Secondary | ICD-10-CM

## 2024-07-17 DIAGNOSIS — I10 Essential (primary) hypertension: Secondary | ICD-10-CM

## 2024-07-17 MED ORDER — PANTOPRAZOLE SODIUM 40 MG PO TBEC: 40.0000 mg | DELAYED_RELEASE_TABLET | Freq: Every day | ORAL | 3 refills | Status: AC

## 2024-07-17 MED ORDER — ROSUVASTATIN CALCIUM 20 MG PO TABS
20.0000 mg | ORAL_TABLET | Freq: Every day | ORAL | 3 refills | Status: AC
Start: 2024-07-17 — End: ?

## 2024-07-17 MED ORDER — AMLODIPINE BESYLATE 5 MG PO TABS: 5.0000 mg | ORAL_TABLET | Freq: Every day | ORAL | 3 refills | Status: AC

## 2024-07-17 MED ORDER — SERTRALINE HCL 50 MG PO TABS: 50.0000 mg | ORAL_TABLET | Freq: Every day | ORAL | 3 refills | Status: AC

## 2024-07-17 NOTE — Progress Notes (Unsigned)
 Flu 2025 Shingles previously done PNA previously done Tetanus 2013 RSV prev done.   COVID d/w pt.   Colon cancer screening not due given his age. Prostate cancer screening not due given his age. Advance directive-daughter designated if patient were incapacitated.   B hand tremor.  Episodic.  It has increased some over the years. Affected his handwriting.  No other new neurologic symptoms.  Usually no tremor at rest.  Tremor increases with movement, ie holding a plate in extension.   Is seeing Dr. Maree re: history of seizure disorder.  He isn't driving.  Still on keppra .  Compliant.     No recent falls, cautions d/w pt.  Using rollator.     Mood is variable, overall better on sertraline  and discussed continuing as is.     H/o anemia. B12 def on replacement.  Still on iron .  Recheck labs pending.    Hypertension:               Using medication without problems or lightheadedness: yes Chest pain with exertion: no Edema:no Short of breath:no   Elevated Cholesterol: Using medications without problems: yes Muscle aches: some aches in the legs.  Tolerable.  Tylenol  helps.  From the B knee to ankles.  New mattress helped hip pain.   Aches not likely from statin, discussed.   Diet compliance: d/w pt.   Exercise: d/w pt.    H/o Barretts on PPI at baseline.  Swallowing well.     PMH and SH reviewed   Meds, vitals, and allergies reviewed.    ROS: Per HPI unless specifically indicated in ROS section    GEN: nad, alert and pleasant in conversation HEENT: mucous membranes moist NECK: supple w/o LA CV: rrr. PULM: ctab, no inc wob ABD: soft, +bs EXT: no edema SKIN: Well-perfused.

## 2024-07-17 NOTE — Patient Instructions (Addendum)
 Please ask Dr. Jamal clinic about tremor treatment options.  Keep going with B12 as is.  Go to the lab on the way out.   If you have mychart we'll likely use that to update you.    Take care.  Glad to see you.

## 2024-07-18 LAB — LIPID PANEL
Cholesterol: 117 mg/dL (ref 0–200)
HDL: 50.8 mg/dL (ref >39.00)
LDL Cholesterol: 47 mg/dL (ref 0–99)
NonHDL: 66.52
Total CHOL/HDL Ratio: 2
Triglycerides: 98 mg/dL (ref 0.0–149.0)
VLDL: 19.6 mg/dL (ref 0.0–40.0)

## 2024-07-18 LAB — FERRITIN: Ferritin: 71.7 ng/mL (ref 22.0–322.0)

## 2024-07-18 LAB — CBC WITH DIFFERENTIAL/PLATELET
Basophils Absolute: 0 K/uL (ref 0.0–0.1)
Basophils Relative: 0.9 % (ref 0.0–3.0)
Eosinophils Absolute: 0.1 K/uL (ref 0.0–0.7)
Eosinophils Relative: 2.8 % (ref 0.0–5.0)
HCT: 41.6 % (ref 39.0–52.0)
Hemoglobin: 14 g/dL (ref 13.0–17.0)
Lymphocytes Relative: 27.5 % (ref 12.0–46.0)
Lymphs Abs: 1.4 K/uL (ref 0.7–4.0)
MCHC: 33.7 g/dL (ref 30.0–36.0)
MCV: 90.2 fl (ref 78.0–100.0)
Monocytes Absolute: 0.3 K/uL (ref 0.1–1.0)
Monocytes Relative: 6.3 % (ref 3.0–12.0)
Neutro Abs: 3.2 K/uL (ref 1.4–7.7)
Neutrophils Relative %: 62.5 % (ref 43.0–77.0)
Platelets: 140 K/uL — ABNORMAL LOW (ref 150.0–400.0)
RBC: 4.61 Mil/uL (ref 4.22–5.81)
RDW: 13.9 % (ref 11.5–15.5)
WBC: 5.1 K/uL (ref 4.0–10.5)

## 2024-07-18 LAB — BASIC METABOLIC PANEL WITH GFR
BUN: 19 mg/dL (ref 6–23)
CO2: 24 meq/L (ref 19–32)
Calcium: 8.8 mg/dL (ref 8.4–10.5)
Chloride: 107 meq/L (ref 96–112)
Creatinine, Ser: 1.67 mg/dL — ABNORMAL HIGH (ref 0.40–1.50)
GFR: 35.44 mL/min — ABNORMAL LOW (ref >60.00)
Glucose, Bld: 102 mg/dL — ABNORMAL HIGH (ref 70–99)
Potassium: 4.8 meq/L (ref 3.5–5.1)
Sodium: 138 meq/L (ref 135–145)

## 2024-07-18 LAB — VITAMIN B12: Vitamin B-12: 836 pg/mL (ref 211–911)

## 2024-07-19 ENCOUNTER — Ambulatory Visit: Admitting: Physical Therapy

## 2024-07-19 NOTE — Assessment & Plan Note (Signed)
Advance directive- daughter designated if patient were incapacitated.   

## 2024-07-19 NOTE — Assessment & Plan Note (Signed)
 Swallowing well.  Would continue pantoprazole  as is.

## 2024-07-19 NOTE — Assessment & Plan Note (Signed)
 Is seeing Dr. Maree re: history of seizure disorder.  He isn't driving.  Still on keppra .  Compliant.   No recent events.

## 2024-07-19 NOTE — Assessment & Plan Note (Signed)
 History of, not recently.  Cautions discussed with patient.  Using rollator, encouraged use.

## 2024-07-19 NOTE — Assessment & Plan Note (Signed)
 Flu 2025 Shingles previously done PNA previously done Tetanus 2013 RSV prev done.   COVID d/w pt.   Colon cancer screening not due given his age. Prostate cancer screening not due given his age. Advance directive-daughter designated if patient were incapacitated.

## 2024-07-19 NOTE — Assessment & Plan Note (Signed)
 Would continue sertraline  as is.

## 2024-07-19 NOTE — Assessment & Plan Note (Signed)
 Is seeing Dr. Maree re: history of seizure disorder.  He isn't driving.  Still on keppra .  Compliant.   I asked patient/daughter to follow-up with neurology to see about treatment options for his tremor.

## 2024-07-19 NOTE — Assessment & Plan Note (Signed)
 H/o anemia. B12 def on replacement.  Still on iron .  Recheck labs pending.  See notes on labs.

## 2024-07-19 NOTE — Assessment & Plan Note (Signed)
 Continue Crestor

## 2024-07-19 NOTE — Assessment & Plan Note (Signed)
 Continue amlodipine

## 2024-07-24 ENCOUNTER — Ambulatory Visit: Admitting: Physical Therapy

## 2024-07-24 ENCOUNTER — Ambulatory Visit: Payer: Self-pay | Admitting: Family Medicine

## 2024-07-26 ENCOUNTER — Ambulatory Visit: Admitting: Physical Therapy

## 2024-07-31 ENCOUNTER — Ambulatory Visit: Admitting: Physical Therapy

## 2024-08-02 ENCOUNTER — Ambulatory Visit: Admitting: Physical Therapy

## 2024-08-07 ENCOUNTER — Ambulatory Visit: Admitting: Physical Therapy

## 2024-08-09 ENCOUNTER — Ambulatory Visit: Admitting: Physical Therapy

## 2024-08-14 ENCOUNTER — Ambulatory Visit: Admitting: Physical Therapy

## 2024-08-16 ENCOUNTER — Ambulatory Visit: Admitting: Physical Therapy

## 2024-08-17 ENCOUNTER — Ambulatory Visit (INDEPENDENT_AMBULATORY_CARE_PROVIDER_SITE_OTHER)

## 2024-08-17 DIAGNOSIS — E538 Deficiency of other specified B group vitamins: Secondary | ICD-10-CM

## 2024-08-17 MED ORDER — CYANOCOBALAMIN 1000 MCG/ML IJ SOLN
1000.0000 ug | Freq: Once | INTRAMUSCULAR | Status: AC
  Administered 2024-08-17: 1000 ug via INTRAMUSCULAR

## 2024-08-17 NOTE — Progress Notes (Signed)
 Per orders of Dr. Crawford Givens, injection of vitamin b 12 given by Lewanda Rife in left deltoid. Patient tolerated injection well. Patient will make appointment for 1 month.

## 2024-08-21 ENCOUNTER — Ambulatory Visit: Admitting: Physical Therapy

## 2024-08-23 ENCOUNTER — Ambulatory Visit: Admitting: Physical Therapy

## 2024-09-19 ENCOUNTER — Ambulatory Visit

## 2024-10-02 ENCOUNTER — Encounter: Payer: Self-pay | Admitting: Family Medicine

## 2024-10-02 MED ORDER — CYANOCOBALAMIN 1000 MCG/ML IJ SOLN: 1000.0000 ug | INTRAMUSCULAR | 3 refills | Status: AC

## 2024-10-24 ENCOUNTER — Ambulatory Visit

## 2025-05-15 ENCOUNTER — Ambulatory Visit
